# Patient Record
Sex: Male | Born: 1945 | Race: Black or African American | Hispanic: No | Marital: Married | Smoking: Former smoker
Health system: Southern US, Community
[De-identification: ages and names within clinical notes are randomized; demographics above are authoritative.]

## PROBLEM LIST (undated history)

## (undated) DIAGNOSIS — Z8719 Personal history of other diseases of the digestive system: Secondary | ICD-10-CM

## (undated) DIAGNOSIS — I4891 Unspecified atrial fibrillation: Secondary | ICD-10-CM

## (undated) DIAGNOSIS — I472 Ventricular tachycardia: Secondary | ICD-10-CM

## (undated) DIAGNOSIS — I219 Acute myocardial infarction, unspecified: Secondary | ICD-10-CM

## (undated) DIAGNOSIS — K922 Gastrointestinal hemorrhage, unspecified: Secondary | ICD-10-CM

## (undated) DIAGNOSIS — I509 Heart failure, unspecified: Secondary | ICD-10-CM

## (undated) DIAGNOSIS — E118 Type 2 diabetes mellitus with unspecified complications: Secondary | ICD-10-CM

## (undated) DIAGNOSIS — K259 Gastric ulcer, unspecified as acute or chronic, without hemorrhage or perforation: Secondary | ICD-10-CM

## (undated) DIAGNOSIS — I4892 Unspecified atrial flutter: Secondary | ICD-10-CM

## (undated) DIAGNOSIS — I10 Essential (primary) hypertension: Secondary | ICD-10-CM

## (undated) HISTORY — PX: HERNIA REPAIR: SHX51

---

## 2005-02-17 ENCOUNTER — Other Ambulatory Visit: Payer: Self-pay

## 2005-02-17 ENCOUNTER — Inpatient Hospital Stay: Payer: Self-pay

## 2006-10-25 ENCOUNTER — Inpatient Hospital Stay: Payer: Self-pay | Admitting: Internal Medicine

## 2006-10-25 ENCOUNTER — Other Ambulatory Visit: Payer: Self-pay

## 2007-04-04 ENCOUNTER — Ambulatory Visit: Payer: Self-pay | Admitting: Family

## 2007-07-17 ENCOUNTER — Ambulatory Visit: Payer: Self-pay | Admitting: Family

## 2007-08-09 ENCOUNTER — Ambulatory Visit: Payer: Self-pay | Admitting: Internal Medicine

## 2007-10-09 ENCOUNTER — Ambulatory Visit: Payer: Self-pay | Admitting: Family

## 2008-01-08 ENCOUNTER — Ambulatory Visit: Payer: Self-pay | Admitting: Family

## 2008-06-19 ENCOUNTER — Ambulatory Visit: Payer: Self-pay | Admitting: Family

## 2008-11-01 ENCOUNTER — Ambulatory Visit: Payer: Self-pay | Admitting: Family

## 2009-05-01 ENCOUNTER — Ambulatory Visit: Payer: Self-pay | Admitting: Family

## 2009-08-20 ENCOUNTER — Ambulatory Visit: Payer: Self-pay | Admitting: Family

## 2009-12-11 ENCOUNTER — Ambulatory Visit: Payer: Self-pay | Admitting: Family

## 2010-04-09 ENCOUNTER — Ambulatory Visit: Payer: Self-pay | Admitting: Family

## 2010-05-26 ENCOUNTER — Emergency Department: Payer: Self-pay | Admitting: Emergency Medicine

## 2010-08-13 ENCOUNTER — Ambulatory Visit: Payer: Self-pay | Admitting: Family

## 2011-07-03 LAB — CK TOTAL AND CKMB (NOT AT ARMC)
CK, Total: 405 U/L — ABNORMAL HIGH (ref 35–232)
CK-MB: 2.1 ng/mL (ref 0.5–3.6)

## 2011-07-03 LAB — CBC
HCT: 40.8 % (ref 40.0–52.0)
MCH: 22.5 pg — ABNORMAL LOW (ref 26.0–34.0)
MCHC: 31.3 g/dL — ABNORMAL LOW (ref 32.0–36.0)
MCV: 72 fL — ABNORMAL LOW (ref 80–100)
Platelet: 305 10*3/uL (ref 150–440)
RDW: 14.4 % (ref 11.5–14.5)

## 2011-07-03 LAB — COMPREHENSIVE METABOLIC PANEL
Albumin: 3.9 g/dL (ref 3.4–5.0)
Alkaline Phosphatase: 48 U/L — ABNORMAL LOW (ref 50–136)
Anion Gap: 10 (ref 7–16)
Bilirubin,Total: 0.3 mg/dL (ref 0.2–1.0)
Calcium, Total: 9.7 mg/dL (ref 8.5–10.1)
EGFR (Non-African Amer.): 53 — ABNORMAL LOW
Glucose: 223 mg/dL — ABNORMAL HIGH (ref 65–99)
Osmolality: 291 (ref 275–301)
Potassium: 3.8 mmol/L (ref 3.5–5.1)
SGOT(AST): 18 U/L (ref 15–37)
Sodium: 141 mmol/L (ref 136–145)

## 2011-07-04 ENCOUNTER — Inpatient Hospital Stay: Payer: Self-pay | Admitting: Student

## 2011-07-04 LAB — FERRITIN: Ferritin (ARMC): 22 ng/mL (ref 8–388)

## 2011-07-04 LAB — TSH: Thyroid Stimulating Horm: 1.6 u[IU]/mL

## 2011-07-04 LAB — IRON AND TIBC
Iron Bind.Cap.(Total): 326 ug/dL (ref 250–450)
Iron: 55 ug/dL — ABNORMAL LOW (ref 65–175)

## 2011-07-05 LAB — LIPID PANEL
Cholesterol: 153 mg/dL (ref 0–200)
Triglycerides: 113 mg/dL (ref 0–200)
VLDL Cholesterol, Calc: 23 mg/dL (ref 5–40)

## 2011-07-05 LAB — BASIC METABOLIC PANEL
Anion Gap: 11 (ref 7–16)
BUN: 19 mg/dL — ABNORMAL HIGH (ref 7–18)
Chloride: 103 mmol/L (ref 98–107)
Creatinine: 1.33 mg/dL — ABNORMAL HIGH (ref 0.60–1.30)
EGFR (African American): >60
EGFR (Non-African Amer.): 57 — ABNORMAL LOW
Glucose: 142 mg/dL — ABNORMAL HIGH (ref 65–99)

## 2011-09-24 ENCOUNTER — Inpatient Hospital Stay: Payer: Self-pay | Admitting: *Deleted

## 2011-09-24 LAB — COMPREHENSIVE METABOLIC PANEL
Albumin: 3.7 g/dL (ref 3.4–5.0)
Alkaline Phosphatase: 53 U/L (ref 50–136)
Anion Gap: 12 (ref 7–16)
BUN: 13 mg/dL (ref 7–18)
Calcium, Total: 9 mg/dL (ref 8.5–10.1)
Chloride: 107 mmol/L (ref 98–107)
Co2: 23 mmol/L (ref 21–32)
Creatinine: 1.28 mg/dL (ref 0.60–1.30)
EGFR (African American): >60
EGFR (Non-African Amer.): 60 — ABNORMAL LOW
Glucose: 152 mg/dL — ABNORMAL HIGH (ref 65–99)
Osmolality: 286 (ref 275–301)
SGOT(AST): 18 U/L (ref 15–37)
Sodium: 142 mmol/L (ref 136–145)

## 2011-09-24 LAB — CK TOTAL AND CKMB (NOT AT ARMC)
CK, Total: 208 U/L (ref 35–232)
CK-MB: 2.2 ng/mL (ref 0.5–3.6)

## 2011-09-24 LAB — CBC
HGB: 11.7 g/dL — ABNORMAL LOW (ref 13.0–18.0)
MCH: 20.7 pg — ABNORMAL LOW (ref 26.0–34.0)
MCHC: 30.6 g/dL — ABNORMAL LOW (ref 32.0–36.0)
MCV: 68 fL — ABNORMAL LOW (ref 80–100)
RBC: 5.64 10*6/uL (ref 4.40–5.90)
RDW: 16.2 % — ABNORMAL HIGH (ref 11.5–14.5)

## 2011-09-24 LAB — MAGNESIUM: Magnesium: 1.4 mg/dL — ABNORMAL LOW

## 2011-09-24 LAB — TROPONIN I
Troponin-I: 0.02 ng/mL
Troponin-I: <0.02 ng/mL

## 2011-09-24 LAB — PRO B NATRIURETIC PEPTIDE: B-Type Natriuretic Peptide: 3580 pg/mL — ABNORMAL HIGH (ref 0–125)

## 2011-09-24 LAB — PROTIME-INR: INR: 1.8

## 2011-09-25 LAB — BASIC METABOLIC PANEL WITH GFR
Anion Gap: 12
BUN: 15 mg/dL
Calcium, Total: 8.3 mg/dL — ABNORMAL LOW
Chloride: 107 mmol/L
Co2: 24 mmol/L
Creatinine: 1.28 mg/dL
EGFR (African American): >60
EGFR (Non-African Amer.): 60 — ABNORMAL LOW
Glucose: 122 mg/dL — ABNORMAL HIGH
Osmolality: 287
Potassium: 4 mmol/L
Sodium: 143 mmol/L

## 2011-12-05 ENCOUNTER — Emergency Department: Payer: Self-pay | Admitting: *Deleted

## 2011-12-05 LAB — PROTIME-INR
INR: 2.3
Prothrombin Time: 25.6 secs — ABNORMAL HIGH (ref 11.5–14.7)

## 2011-12-05 LAB — COMPREHENSIVE METABOLIC PANEL
Albumin: 3.5 g/dL (ref 3.4–5.0)
BUN: 8 mg/dL (ref 7–18)
Bilirubin,Total: 0.3 mg/dL (ref 0.2–1.0)
Calcium, Total: 8.7 mg/dL (ref 8.5–10.1)
Chloride: 108 mmol/L — ABNORMAL HIGH (ref 98–107)
Co2: 23 mmol/L (ref 21–32)
Creatinine: 1.06 mg/dL (ref 0.60–1.30)
EGFR (African American): >60
EGFR (Non-African Amer.): >60
Glucose: 102 mg/dL — ABNORMAL HIGH (ref 65–99)
Osmolality: 280 (ref 275–301)
SGOT(AST): 21 U/L (ref 15–37)
SGPT (ALT): 18 U/L
Sodium: 141 mmol/L (ref 136–145)
Total Protein: 7.9 g/dL (ref 6.4–8.2)

## 2011-12-05 LAB — CBC
HGB: 11.6 g/dL — ABNORMAL LOW (ref 13.0–18.0)
MCH: 19.5 pg — ABNORMAL LOW (ref 26.0–34.0)
RDW: 21 % — ABNORMAL HIGH (ref 11.5–14.5)
WBC: 4.5 10*3/uL (ref 3.8–10.6)

## 2011-12-05 LAB — URINALYSIS, COMPLETE
Bilirubin,UR: NEGATIVE
Glucose,UR: NEGATIVE mg/dL (ref 0–75)
Hyaline Cast: 1
Ketone: NEGATIVE
Nitrite: NEGATIVE
Ph: 7 (ref 4.5–8.0)
Protein: 100
RBC,UR: 3 /HPF (ref 0–5)
Specific Gravity: 1.008 (ref 1.003–1.030)

## 2011-12-05 LAB — CK TOTAL AND CKMB (NOT AT ARMC): CK, Total: 171 U/L (ref 35–232)

## 2012-06-15 ENCOUNTER — Emergency Department: Payer: Self-pay | Admitting: Internal Medicine

## 2012-06-15 LAB — COMPREHENSIVE METABOLIC PANEL
Bilirubin,Total: 0.3 mg/dL (ref 0.2–1.0)
Calcium, Total: 8.8 mg/dL (ref 8.5–10.1)
Chloride: 110 mmol/L — ABNORMAL HIGH (ref 98–107)
EGFR (African American): >60
EGFR (Non-African Amer.): >60
Glucose: 138 mg/dL — ABNORMAL HIGH (ref 65–99)
Osmolality: 284 (ref 275–301)
Potassium: 3.7 mmol/L (ref 3.5–5.1)
SGPT (ALT): 19 U/L (ref 12–78)
Sodium: 141 mmol/L (ref 136–145)
Total Protein: 8 g/dL (ref 6.4–8.2)

## 2012-06-15 LAB — CBC
HCT: 40.2 % (ref 40.0–52.0)
HGB: 12.5 g/dL — ABNORMAL LOW (ref 13.0–18.0)
MCH: 21.6 pg — ABNORMAL LOW (ref 26.0–34.0)
MCHC: 31.1 g/dL — ABNORMAL LOW (ref 32.0–36.0)
Platelet: 286 10*3/uL (ref 150–440)
RDW: 15.5 % — ABNORMAL HIGH (ref 11.5–14.5)
WBC: 4.5 10*3/uL (ref 3.8–10.6)

## 2012-11-27 ENCOUNTER — Emergency Department: Payer: Self-pay | Admitting: Emergency Medicine

## 2012-11-27 LAB — BASIC METABOLIC PANEL
Anion Gap: 5 — ABNORMAL LOW (ref 7–16)
BUN: 13 mg/dL (ref 7–18)
Co2: 27 mmol/L (ref 21–32)
EGFR (African American): >60
EGFR (Non-African Amer.): >60
Potassium: 3.9 mmol/L (ref 3.5–5.1)

## 2012-11-27 LAB — CBC: HGB: 12 g/dL — ABNORMAL LOW (ref 13.0–18.0)

## 2013-04-25 LAB — BASIC METABOLIC PANEL
Anion Gap: 5 — ABNORMAL LOW (ref 7–16)
Calcium, Total: 9 mg/dL (ref 8.5–10.1)
Chloride: 107 mmol/L (ref 98–107)
Co2: 26 mmol/L (ref 21–32)
Creatinine: 1.84 mg/dL — ABNORMAL HIGH (ref 0.60–1.30)
EGFR (African American): 43 — ABNORMAL LOW
EGFR (Non-African Amer.): 37 — ABNORMAL LOW
Potassium: 4.3 mmol/L (ref 3.5–5.1)
Sodium: 138 mmol/L (ref 136–145)

## 2013-04-25 LAB — CBC
HGB: 8.8 g/dL — ABNORMAL LOW (ref 13.0–18.0)
MCH: 21.4 pg — ABNORMAL LOW (ref 26.0–34.0)
MCHC: 31.2 g/dL — ABNORMAL LOW (ref 32.0–36.0)
MCV: 69 fL — ABNORMAL LOW (ref 80–100)
RBC: 4.12 10*6/uL — ABNORMAL LOW (ref 4.40–5.90)
RDW: 14.5 % (ref 11.5–14.5)
WBC: 7.3 10*3/uL (ref 3.8–10.6)

## 2013-04-25 LAB — TROPONIN I: Troponin-I: <0.02 ng/mL

## 2013-04-25 LAB — CK TOTAL AND CKMB (NOT AT ARMC): CK, Total: 160 U/L (ref 35–232)

## 2013-04-26 ENCOUNTER — Inpatient Hospital Stay: Payer: Self-pay | Admitting: Internal Medicine

## 2013-04-26 LAB — URINALYSIS, COMPLETE
Bacteria: NONE SEEN
Bilirubin,UR: NEGATIVE
Blood: NEGATIVE
Glucose,UR: 50 mg/dL (ref 0–75)
Ketone: NEGATIVE
Protein: 30
RBC,UR: 6 /HPF (ref 0–5)
Specific Gravity: 1.021 (ref 1.003–1.030)
WBC UR: 1 /HPF (ref 0–5)

## 2013-04-26 LAB — HEMOGLOBIN
HGB: 7.4 g/dL — ABNORMAL LOW (ref 13.0–18.0)
HGB: 7.4 g/dL — ABNORMAL LOW (ref 13.0–18.0)

## 2013-04-26 LAB — OCCULT BLOOD X 1 CARD TO LAB, STOOL: Occult Blood, Feces: POSITIVE

## 2013-04-26 LAB — CK TOTAL AND CKMB (NOT AT ARMC)
CK, Total: 125 U/L (ref 35–232)
CK-MB: 1.2 ng/mL (ref 0.5–3.6)

## 2013-04-26 LAB — TROPONIN I: Troponin-I: <0.02 ng/mL

## 2013-04-26 LAB — HEMATOCRIT: HCT: 23.1 % — ABNORMAL LOW (ref 40.0–52.0)

## 2013-04-27 LAB — CBC WITH DIFFERENTIAL/PLATELET
Basophil #: 0.1 10*3/uL (ref 0.0–0.1)
Basophil %: 1.1 %
Eosinophil #: 0.4 10*3/uL (ref 0.0–0.7)
Eosinophil %: 6.1 %
HGB: 8 g/dL — ABNORMAL LOW (ref 13.0–18.0)
Lymphocyte #: 1.7 10*3/uL (ref 1.0–3.6)
Lymphocyte %: 29.3 %
Monocyte #: 0.5 x10 3/mm (ref 0.2–1.0)
Neutrophil #: 3.2 10*3/uL (ref 1.4–6.5)
Neutrophil %: 55.4 %
Platelet: 202 10*3/uL (ref 150–440)
RDW: 16.5 % — ABNORMAL HIGH (ref 11.5–14.5)
WBC: 5.8 10*3/uL (ref 3.8–10.6)

## 2013-04-28 LAB — STOOL CULTURE

## 2013-05-27 ENCOUNTER — Inpatient Hospital Stay: Payer: Self-pay | Admitting: Internal Medicine

## 2013-05-27 LAB — PROTIME-INR
INR: 1
Prothrombin Time: 13.7 secs (ref 11.5–14.7)

## 2013-05-27 LAB — COMPREHENSIVE METABOLIC PANEL
Albumin: 3.9 g/dL (ref 3.4–5.0)
Alkaline Phosphatase: 56 U/L
BUN: 16 mg/dL (ref 7–18)
Bilirubin,Total: 0.2 mg/dL (ref 0.2–1.0)
Chloride: 104 mmol/L (ref 98–107)
EGFR (Non-African Amer.): 37 — ABNORMAL LOW
Glucose: 169 mg/dL — ABNORMAL HIGH (ref 65–99)
Osmolality: 277 (ref 275–301)
SGPT (ALT): 23 U/L (ref 12–78)
Sodium: 136 mmol/L (ref 136–145)

## 2013-05-27 LAB — CBC
HGB: 10.4 g/dL — ABNORMAL LOW (ref 13.0–18.0)
MCH: 20.7 pg — ABNORMAL LOW (ref 26.0–34.0)
MCHC: 30.9 g/dL — ABNORMAL LOW (ref 32.0–36.0)
Platelet: 403 10*3/uL (ref 150–440)
WBC: 7.2 10*3/uL (ref 3.8–10.6)

## 2013-05-27 LAB — LIPID PANEL
Cholesterol: 167 mg/dL (ref 0–200)
Triglycerides: 111 mg/dL (ref 0–200)
VLDL Cholesterol, Calc: 22 mg/dL (ref 5–40)

## 2013-05-27 LAB — APTT: Activated PTT: 26.6 secs (ref 23.6–35.9)

## 2013-05-27 LAB — TROPONIN I: Troponin-I: <0.02 ng/mL

## 2013-05-28 LAB — CBC WITH DIFFERENTIAL/PLATELET
Basophil #: 0.1 10*3/uL (ref 0.0–0.1)
Basophil %: 1.1 %
Eosinophil #: 0.2 10*3/uL (ref 0.0–0.7)
Eosinophil %: 3.8 %
HCT: 27.8 % — ABNORMAL LOW (ref 40.0–52.0)
HGB: 8.8 g/dL — ABNORMAL LOW (ref 13.0–18.0)
Lymphocyte #: 1.9 10*3/uL (ref 1.0–3.6)
Lymphocyte %: 29.9 %
MCH: 20.6 pg — ABNORMAL LOW (ref 26.0–34.0)
MCHC: 31.5 g/dL — ABNORMAL LOW (ref 32.0–36.0)
Monocyte %: 9.3 %
Neutrophil %: 55.9 %
RBC: 4.25 10*6/uL — ABNORMAL LOW (ref 4.40–5.90)
RDW: 20.5 % — ABNORMAL HIGH (ref 11.5–14.5)

## 2013-05-28 LAB — BASIC METABOLIC PANEL
Anion Gap: 5 — ABNORMAL LOW (ref 7–16)
BUN: 19 mg/dL — ABNORMAL HIGH (ref 7–18)
Calcium, Total: 8.6 mg/dL (ref 8.5–10.1)
Chloride: 108 mmol/L — ABNORMAL HIGH (ref 98–107)
Co2: 26 mmol/L (ref 21–32)
EGFR (Non-African Amer.): 48 — ABNORMAL LOW
Potassium: 3.8 mmol/L (ref 3.5–5.1)
Sodium: 139 mmol/L (ref 136–145)

## 2013-05-28 LAB — TROPONIN I: Troponin-I: <0.02 ng/mL

## 2013-05-28 LAB — MAGNESIUM: Magnesium: 1.6 mg/dL — ABNORMAL LOW

## 2013-05-28 LAB — TSH: Thyroid Stimulating Horm: 0.789 u[IU]/mL

## 2014-04-27 ENCOUNTER — Inpatient Hospital Stay: Payer: Self-pay | Admitting: Internal Medicine

## 2014-04-27 LAB — COMPREHENSIVE METABOLIC PANEL
ALK PHOS: 30 U/L — AB
Albumin: 3.2 g/dL — ABNORMAL LOW (ref 3.4–5.0)
Anion Gap: 7 (ref 7–16)
BUN: 51 mg/dL — AB (ref 7–18)
Bilirubin,Total: 0.2 mg/dL (ref 0.2–1.0)
CALCIUM: 8.4 mg/dL — AB (ref 8.5–10.1)
CO2: 26 mmol/L (ref 21–32)
Chloride: 108 mmol/L — ABNORMAL HIGH (ref 98–107)
Creatinine: 1.72 mg/dL — ABNORMAL HIGH (ref 0.60–1.30)
EGFR (African American): 51 — ABNORMAL LOW
EGFR (Non-African Amer.): 42 — ABNORMAL LOW
Glucose: 286 mg/dL — ABNORMAL HIGH (ref 65–99)
OSMOLALITY: 305 (ref 275–301)
Potassium: 4.3 mmol/L (ref 3.5–5.1)
SGOT(AST): 15 U/L (ref 15–37)
SGPT (ALT): 27 U/L
Sodium: 141 mmol/L (ref 136–145)
Total Protein: 6.5 g/dL (ref 6.4–8.2)

## 2014-04-27 LAB — CBC
HCT: 24.5 % — ABNORMAL LOW (ref 40.0–52.0)
HGB: 7.7 g/dL — AB (ref 13.0–18.0)
MCH: 23.3 pg — ABNORMAL LOW (ref 26.0–34.0)
MCHC: 31.3 g/dL — ABNORMAL LOW (ref 32.0–36.0)
MCV: 75 fL — ABNORMAL LOW (ref 80–100)
Platelet: 202 10*3/uL (ref 150–440)
RBC: 3.29 10*6/uL — AB (ref 4.40–5.90)
RDW: 14.2 % (ref 11.5–14.5)
WBC: 3.9 10*3/uL (ref 3.8–10.6)

## 2014-04-27 LAB — PROTIME-INR
INR: 5.5
Prothrombin Time: 48.2 secs — ABNORMAL HIGH (ref 11.5–14.7)

## 2014-04-27 LAB — TROPONIN I: Troponin-I: <0.02 ng/mL

## 2014-04-27 LAB — APTT: Activated PTT: 44.6 secs — ABNORMAL HIGH (ref 23.6–35.9)

## 2014-04-27 LAB — HEMOGLOBIN: HGB: 7.3 g/dL — AB (ref 13.0–18.0)

## 2014-04-28 LAB — BASIC METABOLIC PANEL
ANION GAP: 7 (ref 7–16)
BUN: 40 mg/dL — ABNORMAL HIGH (ref 7–18)
Calcium, Total: 8.1 mg/dL — ABNORMAL LOW (ref 8.5–10.1)
Chloride: 109 mmol/L — ABNORMAL HIGH (ref 98–107)
Co2: 27 mmol/L (ref 21–32)
Creatinine: 1.56 mg/dL — ABNORMAL HIGH (ref 0.60–1.30)
EGFR (African American): 57 — ABNORMAL LOW
EGFR (Non-African Amer.): 47 — ABNORMAL LOW
Glucose: 124 mg/dL — ABNORMAL HIGH (ref 65–99)
Osmolality: 296 (ref 275–301)
Potassium: 3.9 mmol/L (ref 3.5–5.1)
Sodium: 143 mmol/L (ref 136–145)

## 2014-04-28 LAB — CBC WITH DIFFERENTIAL/PLATELET
BASOS ABS: 0.1 10*3/uL (ref 0.0–0.1)
Basophil %: 1.5 %
EOS ABS: 0.2 10*3/uL (ref 0.0–0.7)
Eosinophil %: 4.3 %
HCT: 23 % — AB (ref 40.0–52.0)
HGB: 7.3 g/dL — ABNORMAL LOW (ref 13.0–18.0)
LYMPHS ABS: 1.3 10*3/uL (ref 1.0–3.6)
Lymphocyte %: 23.8 %
MCH: 24.2 pg — ABNORMAL LOW (ref 26.0–34.0)
MCHC: 31.8 g/dL — ABNORMAL LOW (ref 32.0–36.0)
MCV: 76 fL — ABNORMAL LOW (ref 80–100)
Monocyte #: 0.3 x10 3/mm (ref 0.2–1.0)
Monocyte %: 6.4 %
Neutrophil #: 3.4 10*3/uL (ref 1.4–6.5)
Neutrophil %: 64 %
Platelet: 163 10*3/uL (ref 150–440)
RBC: 3.03 10*6/uL — AB (ref 4.40–5.90)
RDW: 15.8 % — ABNORMAL HIGH (ref 11.5–14.5)
WBC: 5.3 10*3/uL (ref 3.8–10.6)

## 2014-04-28 LAB — HEMOGLOBIN A1C: Hemoglobin A1C: 7.6 % — ABNORMAL HIGH (ref 4.2–6.3)

## 2014-04-28 LAB — PROTIME-INR
INR: 1.2
PROTHROMBIN TIME: 14.6 s (ref 11.5–14.7)

## 2014-04-28 LAB — MAGNESIUM: Magnesium: 1.5 mg/dL — ABNORMAL LOW

## 2014-04-29 LAB — PROTIME-INR
INR: 1.1
Prothrombin Time: 14.5 secs (ref 11.5–14.7)

## 2014-04-29 LAB — CBC WITH DIFFERENTIAL/PLATELET
Basophil #: 0.1 10*3/uL (ref 0.0–0.1)
Basophil %: 1.1 %
EOS PCT: 4.9 %
Eosinophil #: 0.2 10*3/uL (ref 0.0–0.7)
HCT: 21.5 % — ABNORMAL LOW (ref 40.0–52.0)
HGB: 6.9 g/dL — AB (ref 13.0–18.0)
Lymphocyte #: 1.1 10*3/uL (ref 1.0–3.6)
Lymphocyte %: 21.9 %
MCH: 24.6 pg — AB (ref 26.0–34.0)
MCHC: 32.1 g/dL (ref 32.0–36.0)
MCV: 77 fL — ABNORMAL LOW (ref 80–100)
Monocyte #: 0.5 x10 3/mm (ref 0.2–1.0)
Monocyte %: 9.1 %
NEUTROS ABS: 3.2 10*3/uL (ref 1.4–6.5)
NEUTROS PCT: 63 %
Platelet: 169 10*3/uL (ref 150–440)
RBC: 2.81 10*6/uL — ABNORMAL LOW (ref 4.40–5.90)
RDW: 15.8 % — AB (ref 11.5–14.5)
WBC: 5.1 10*3/uL (ref 3.8–10.6)

## 2014-04-30 LAB — CBC WITH DIFFERENTIAL/PLATELET
BASOS PCT: 1.1 %
Basophil #: 0.1 10*3/uL (ref 0.0–0.1)
Eosinophil #: 0.2 10*3/uL (ref 0.0–0.7)
Eosinophil %: 4.2 %
HCT: 24.3 % — ABNORMAL LOW (ref 40.0–52.0)
HGB: 7.9 g/dL — AB (ref 13.0–18.0)
LYMPHS ABS: 1.1 10*3/uL (ref 1.0–3.6)
LYMPHS PCT: 20.5 %
MCH: 26.1 pg (ref 26.0–34.0)
MCHC: 32.5 g/dL (ref 32.0–36.0)
MCV: 80 fL (ref 80–100)
MONOS PCT: 8.5 %
Monocyte #: 0.5 x10 3/mm (ref 0.2–1.0)
NEUTROS PCT: 65.7 %
Neutrophil #: 3.6 10*3/uL (ref 1.4–6.5)
PLATELETS: 183 10*3/uL (ref 150–440)
RBC: 3.04 10*6/uL — AB (ref 4.40–5.90)
RDW: 17.9 % — AB (ref 11.5–14.5)
WBC: 5.4 10*3/uL (ref 3.8–10.6)

## 2014-05-20 ENCOUNTER — Inpatient Hospital Stay (HOSPITAL_COMMUNITY)
Admission: EM | Admit: 2014-05-20 | Discharge: 2014-05-27 | DRG: 281 | Disposition: A | Discharge status: Home / Self Care | Payer: Medicare HMO | Source: Ambulatory Visit | Attending: Cardiovascular Disease | Admitting: Cardiovascular Disease

## 2014-05-20 ENCOUNTER — Emergency Department: Payer: Self-pay | Admitting: Emergency Medicine

## 2014-05-20 ENCOUNTER — Encounter (HOSPITAL_COMMUNITY): Payer: Self-pay | Admitting: Cardiovascular Disease

## 2014-05-20 ENCOUNTER — Encounter (HOSPITAL_COMMUNITY)
Admission: EM | Disposition: A | Discharge status: Home / Self Care | Payer: Self-pay | Source: Ambulatory Visit | Attending: Cardiovascular Disease

## 2014-05-20 DIAGNOSIS — N183 Chronic kidney disease, stage 3 unspecified: Secondary | ICD-10-CM

## 2014-05-20 DIAGNOSIS — R001 Bradycardia, unspecified: Secondary | ICD-10-CM | POA: Diagnosis not present

## 2014-05-20 DIAGNOSIS — I480 Paroxysmal atrial fibrillation: Secondary | ICD-10-CM | POA: Diagnosis present

## 2014-05-20 DIAGNOSIS — E119 Type 2 diabetes mellitus without complications: Secondary | ICD-10-CM | POA: Diagnosis present

## 2014-05-20 DIAGNOSIS — I129 Hypertensive chronic kidney disease with stage 1 through stage 4 chronic kidney disease, or unspecified chronic kidney disease: Secondary | ICD-10-CM | POA: Diagnosis present

## 2014-05-20 DIAGNOSIS — I2510 Atherosclerotic heart disease of native coronary artery without angina pectoris: Secondary | ICD-10-CM | POA: Diagnosis present

## 2014-05-20 DIAGNOSIS — I472 Ventricular tachycardia, unspecified: Secondary | ICD-10-CM

## 2014-05-20 DIAGNOSIS — E785 Hyperlipidemia, unspecified: Secondary | ICD-10-CM | POA: Diagnosis present

## 2014-05-20 DIAGNOSIS — IMO0002 Reserved for concepts with insufficient information to code with codable children: Secondary | ICD-10-CM

## 2014-05-20 DIAGNOSIS — I214 Non-ST elevation (NSTEMI) myocardial infarction: Secondary | ICD-10-CM | POA: Diagnosis present

## 2014-05-20 DIAGNOSIS — D649 Anemia, unspecified: Secondary | ICD-10-CM

## 2014-05-20 DIAGNOSIS — D509 Iron deficiency anemia, unspecified: Secondary | ICD-10-CM | POA: Diagnosis present

## 2014-05-20 DIAGNOSIS — I509 Heart failure, unspecified: Secondary | ICD-10-CM | POA: Diagnosis present

## 2014-05-20 DIAGNOSIS — E1165 Type 2 diabetes mellitus with hyperglycemia: Secondary | ICD-10-CM

## 2014-05-20 DIAGNOSIS — M542 Cervicalgia: Secondary | ICD-10-CM | POA: Diagnosis present

## 2014-05-20 DIAGNOSIS — I10 Essential (primary) hypertension: Secondary | ICD-10-CM | POA: Diagnosis present

## 2014-05-20 DIAGNOSIS — E118 Type 2 diabetes mellitus with unspecified complications: Secondary | ICD-10-CM

## 2014-05-20 HISTORY — DX: Gastrointestinal hemorrhage, unspecified: K92.2

## 2014-05-20 HISTORY — DX: Ventricular tachycardia: I47.2

## 2014-05-20 HISTORY — DX: Type 2 diabetes mellitus with unspecified complications: E11.8

## 2014-05-20 HISTORY — PX: LEFT HEART CATHETERIZATION WITH CORONARY ANGIOGRAM: SHX5451

## 2014-05-20 HISTORY — DX: Unspecified atrial fibrillation: I48.91

## 2014-05-20 HISTORY — DX: Ventricular tachycardia, unspecified: I47.20

## 2014-05-20 HISTORY — DX: Gastric ulcer, unspecified as acute or chronic, without hemorrhage or perforation: K25.9

## 2014-05-20 HISTORY — DX: Unspecified atrial flutter: I48.92

## 2014-05-20 HISTORY — DX: Essential (primary) hypertension: I10

## 2014-05-20 LAB — CBC
HCT: 37.4 % — AB (ref 40.0–52.0)
HGB: 11.5 g/dL — ABNORMAL LOW (ref 13.0–18.0)
MCH: 24.1 pg — ABNORMAL LOW (ref 26.0–34.0)
MCHC: 30.7 g/dL — ABNORMAL LOW (ref 32.0–36.0)
MCV: 79 fL — AB (ref 80–100)
PLATELETS: 313 10*3/uL (ref 150–440)
RBC: 4.77 10*6/uL (ref 4.40–5.90)
RDW: 17 % — ABNORMAL HIGH (ref 11.5–14.5)
WBC: 5.9 10*3/uL (ref 3.8–10.6)

## 2014-05-20 LAB — COMPREHENSIVE METABOLIC PANEL
ALK PHOS: 52 U/L
AST: 21 U/L (ref 15–37)
Albumin: 3.6 g/dL (ref 3.4–5.0)
Anion Gap: 9 (ref 7–16)
BUN: 29 mg/dL — AB (ref 7–18)
Bilirubin,Total: 0.3 mg/dL (ref 0.2–1.0)
CHLORIDE: 107 mmol/L (ref 98–107)
CREATININE: 1.87 mg/dL — AB (ref 0.60–1.30)
Calcium, Total: 8.5 mg/dL (ref 8.5–10.1)
Co2: 25 mmol/L (ref 21–32)
EGFR (African American): 46 — ABNORMAL LOW
GFR CALC NON AF AMER: 38 — AB
Glucose: 245 mg/dL — ABNORMAL HIGH (ref 65–99)
OSMOLALITY: 295 (ref 275–301)
POTASSIUM: 3.9 mmol/L (ref 3.5–5.1)
SGPT (ALT): 39 U/L
Sodium: 141 mmol/L (ref 136–145)
Total Protein: 7.8 g/dL (ref 6.4–8.2)

## 2014-05-20 LAB — CK TOTAL AND CKMB (NOT AT ARMC)
CK, TOTAL: 232 U/L (ref 39–308)
CK-MB: 1.8 ng/mL (ref 0.5–3.6)

## 2014-05-20 LAB — PROTIME-INR
INR: 1.1
INR: 1.08 (ref 0.00–1.49)
PROTHROMBIN TIME: 14.3 s (ref 11.5–14.7)
Prothrombin Time: 14.1 seconds (ref 11.6–15.2)

## 2014-05-20 LAB — APTT: Activated PTT: 23.3 secs — ABNORMAL LOW (ref 23.6–35.9)

## 2014-05-20 LAB — GLUCOSE, CAPILLARY: Glucose-Capillary: 208 mg/dL — ABNORMAL HIGH (ref 70–99)

## 2014-05-20 LAB — PRO B NATRIURETIC PEPTIDE: Pro B Natriuretic peptide (BNP): 522.2 pg/mL — ABNORMAL HIGH (ref 0–125)

## 2014-05-20 LAB — MRSA PCR SCREENING: MRSA by PCR: NEGATIVE

## 2014-05-20 LAB — TROPONIN I

## 2014-05-20 SURGERY — LEFT HEART CATHETERIZATION WITH CORONARY ANGIOGRAM
Anesthesia: LOCAL

## 2014-05-20 MED ORDER — NITROGLYCERIN 1 MG/10 ML FOR IR/CATH LAB
INTRA_ARTERIAL | Status: AC
  Filled 2014-05-20: qty 10

## 2014-05-20 MED ORDER — INSULIN ASPART 100 UNIT/ML ~~LOC~~ SOLN
0.0000 [IU] | Freq: Three times a day (TID) | SUBCUTANEOUS | Status: DC
  Administered 2014-05-21 (×2): 3 [IU] via SUBCUTANEOUS
  Administered 2014-05-21: 8 [IU] via SUBCUTANEOUS
  Administered 2014-05-22 – 2014-05-23 (×4): 3 [IU] via SUBCUTANEOUS
  Administered 2014-05-24 (×3): 2 [IU] via SUBCUTANEOUS
  Administered 2014-05-25: 3 [IU] via SUBCUTANEOUS
  Administered 2014-05-25 (×2): 2 [IU] via SUBCUTANEOUS
  Administered 2014-05-26: 3 [IU] via SUBCUTANEOUS
  Administered 2014-05-26 – 2014-05-27 (×3): 2 [IU] via SUBCUTANEOUS

## 2014-05-20 MED ORDER — HEPARIN (PORCINE) IN NACL 2-0.9 UNIT/ML-% IJ SOLN
INTRAMUSCULAR | Status: AC
  Filled 2014-05-20: qty 1500

## 2014-05-20 MED ORDER — MIDAZOLAM HCL 2 MG/2ML IJ SOLN
INTRAMUSCULAR | Status: AC
  Filled 2014-05-20: qty 2

## 2014-05-20 MED ORDER — HEPARIN (PORCINE) IN NACL 100-0.45 UNIT/ML-% IJ SOLN
1750.0000 [IU]/h | INTRAMUSCULAR | Status: DC
  Administered 2014-05-20: 1100 [IU]/h via INTRAVENOUS
  Administered 2014-05-21: 1400 [IU]/h via INTRAVENOUS
  Administered 2014-05-23: 1500 [IU]/h via INTRAVENOUS
  Administered 2014-05-24: 1650 [IU]/h via INTRAVENOUS
  Administered 2014-05-24: 1500 [IU]/h via INTRAVENOUS
  Filled 2014-05-20 (×10): qty 250

## 2014-05-20 MED ORDER — OXYCODONE-ACETAMINOPHEN 5-325 MG PO TABS: 1.0000 | ORAL_TABLET | ORAL | Status: DC | PRN

## 2014-05-20 MED ORDER — ONDANSETRON HCL 4 MG/2ML IJ SOLN: 4.0000 mg | Freq: Four times a day (QID) | INTRAMUSCULAR | Status: DC | PRN

## 2014-05-20 MED ORDER — LIDOCAINE HCL (PF) 1 % IJ SOLN
INTRAMUSCULAR | Status: AC
  Filled 2014-05-20: qty 30

## 2014-05-20 MED ORDER — SODIUM CHLORIDE 0.9 % IJ SOLN: 3.0000 mL | INTRAMUSCULAR | Status: DC | PRN

## 2014-05-20 MED ORDER — WARFARIN SODIUM 5 MG PO TABS
5.0000 mg | ORAL_TABLET | Freq: Once | ORAL | Status: AC
  Administered 2014-05-20: 5 mg via ORAL
  Filled 2014-05-20: qty 1

## 2014-05-20 MED ORDER — METOPROLOL TARTRATE 25 MG PO TABS: 25.0000 mg | ORAL_TABLET | Freq: Two times a day (BID) | ORAL | Status: DC

## 2014-05-20 MED ORDER — WARFARIN - PHARMACIST DOSING INPATIENT
Freq: Every day | Status: DC
  Administered 2014-05-25: 18:00:00

## 2014-05-20 MED ORDER — HYDRALAZINE HCL 20 MG/ML IJ SOLN: 10.0000 mg | INTRAMUSCULAR | Status: DC | PRN

## 2014-05-20 MED ORDER — METOPROLOL TARTRATE 50 MG PO TABS
50.0000 mg | ORAL_TABLET | Freq: Two times a day (BID) | ORAL | Status: DC
  Administered 2014-05-20 – 2014-05-22 (×5): 50 mg via ORAL
  Filled 2014-05-20: qty 1
  Filled 2014-05-20: qty 2
  Filled 2014-05-20 (×6): qty 1

## 2014-05-20 MED ORDER — LABETALOL HCL 5 MG/ML IV SOLN
10.0000 mg | INTRAVENOUS | Status: DC | PRN
  Filled 2014-05-20: qty 4

## 2014-05-20 MED ORDER — SODIUM CHLORIDE 0.9 % IV SOLN: 250.0000 mL | INTRAVENOUS | Status: DC | PRN

## 2014-05-20 MED ORDER — SODIUM CHLORIDE 0.9 % IV SOLN
1.0000 mL/kg/h | INTRAVENOUS | Status: DC
  Administered 2014-05-20 (×2): 1 mL/kg/h via INTRAVENOUS

## 2014-05-20 MED ORDER — FENTANYL CITRATE 0.05 MG/ML IJ SOLN
INTRAMUSCULAR | Status: AC
  Filled 2014-05-20: qty 2

## 2014-05-20 MED ORDER — ACETAMINOPHEN 325 MG PO TABS
650.0000 mg | ORAL_TABLET | ORAL | Status: DC | PRN
  Administered 2014-05-26: 650 mg via ORAL
  Filled 2014-05-20: qty 2

## 2014-05-20 MED ORDER — INSULIN ASPART 100 UNIT/ML ~~LOC~~ SOLN
0.0000 [IU] | Freq: Every day | SUBCUTANEOUS | Status: DC
  Administered 2014-05-26: 2 [IU] via SUBCUTANEOUS

## 2014-05-20 MED ORDER — NITROGLYCERIN 0.4 MG SL SUBL: 0.4000 mg | SUBLINGUAL_TABLET | SUBLINGUAL | Status: DC | PRN

## 2014-05-20 MED ORDER — VERAPAMIL HCL 2.5 MG/ML IV SOLN
INTRAVENOUS | Status: AC
  Filled 2014-05-20: qty 2

## 2014-05-20 MED ORDER — HEPARIN SODIUM (PORCINE) 1000 UNIT/ML IJ SOLN
INTRAMUSCULAR | Status: AC
  Filled 2014-05-20: qty 1

## 2014-05-20 MED ORDER — SODIUM CHLORIDE 0.9 % IJ SOLN
3.0000 mL | Freq: Two times a day (BID) | INTRAMUSCULAR | Status: DC
  Administered 2014-05-20 – 2014-05-27 (×8): 3 mL via INTRAVENOUS

## 2014-05-20 MED ORDER — ATORVASTATIN CALCIUM 40 MG PO TABS
40.0000 mg | ORAL_TABLET | Freq: Every day | ORAL | Status: DC
  Administered 2014-05-21 – 2014-05-26 (×6): 40 mg via ORAL
  Filled 2014-05-20 (×7): qty 1

## 2014-05-20 NOTE — Progress Notes (Signed)
elink only evaluation note for new admit    ICD-9-CM ICD-10-CM   1. NSTEMI (non-ST elevated myocardial infarction) 410.70 I21.4 Portable chest x-ray 1 view     Portable chest x-ray 1 view    Patient Active Problem List   Diagnosis Date Noted  . Ventricular tachycardia 05/20/2014  . NSTEMI (non-ST elevated myocardial infarction) 05/20/2014   On camera  - not intubated  - looks well  LABS PULMONARY No results for input(s): PHART, PCO2ART, PO2ART, HCO3, TCO2, O2SAT in the last 168 hours.  Invalid input(s): PCO2, PO2  CBC No results for input(s): HGB, HCT, WBC, PLT in the last 168 hours.  COAGULATION No results for input(s): INR in the last 168 hours.  CARDIAC  No results for input(s): TROPONINI in the last 168 hours. No results for input(s): PROBNP in the last 168 hours.   CHEMISTRY No results for input(s): NA, K, CL, CO2, GLUCOSE, BUN, CREATININE, CALCIUM, MG, PHOS in the last 168 hours. CrCl cannot be calculated (Patient has no serum creatinine result on file.).   LIVER No results for input(s): AST, ALT, ALKPHOS, BILITOT, PROT, ALBUMIN, INR in the last 168 hours.   INFECTIOUS No results for input(s): LATICACIDVEN, PROCALCITON in the last 168 hours.   ENDOCRINE CBG (last 3)  No results for input(s): GLUCAP in the last 72 hours.       IMAGING x48h No results found.   A NO acute EICU intervenion  Dr. Kalman ShanMurali Braidyn Scorsone, M.D., [facility]C.P Pulmonary and Critical Care Medicine Staff Physician [facility] Pager: [phone], If no answer or between  15:00h - 7:00h: call [phone]  05/20/2014 7:54 PM

## 2014-05-20 NOTE — H&P (Signed)
History and Physical  Patient ID: [name] MRN: [id number], SOB: 09/14/1945 68 y.o. Date of Encounter: 05/20/2014, 6:25 PM  Primary Physician: No primary care provider on file. Primary Cardiologist: Dr Gwen PoundsKowalski  Chief Complaint: Neck pain  HPI: 68 y.o. male w/ PMHx significant for atrial fibrillation, CHF, and GI bleeding who [facility] with complaints of neck pain.  He was [facility]. He was noted to have anterolateral ST-T wave changes concerning for acute ischemia but not diagnostic of STEMI. However, he developed wide-complex tachycardia c/w VT requiring DCCV and IV amiodarone, converting back to sinus rhythm. A Code STEMI was paged out because of ongoing ischemic symptoms and VT arrest.   The patient has a history of paroxysmal atrial fibrillation. He has previously been anticoagulated, but has had problems with gastrointestinal bleeding. He did have recent upper endoscopy demonstrating only esophagitis and gastric ulcers with clean bases. He has not had black stools or bright red blood in his stools over the last 2 weeks. He's had no chest pain, but describes neck tightness yesterday and earlier today without relief. He felt like it was difficult to swallow. He has no shortness of breath, palpitations, or syncope. However, he did have marked diaphoresis this afternoon when symptoms began.   The patient presents directly to the cardiac catheterization lab for emergency cardiac cath and possible PCI.   Past Medical History  Diagnosis Date  . Ventricular tachycardia 05/20/2014     Surgical History: No past surgical history on file.   Home Meds: Prior to Admission medications   Not on File    Allergies: No Known Allergies  History   Social History  . Marital Status: Married    Spouse Name: N/A    Number of Children: N/A  . Years of Education: N/A   Occupational History  . Not on file.   Social History Main  Topics  . Smoking status: Not on file  . Smokeless tobacco: Not on file  . Alcohol Use: Not on file  . Drug Use: Not on file  . Sexual Activity: Not on file   Other Topics Concern  . Not on file   Social History Narrative    Family Hx: negative for premature CAD  Review of Systems: General: negative for chills, fever, night sweats or weight changes.  ENT: negative for rhinorrhea or epistaxis Cardiovascular: see HPI Dermatological: negative for rash Respiratory: negative for cough or wheezing GI: negative for nausea, vomiting. Positive for diarrhea, dark stools GU: no hematuria, urgency, or frequency Neurologic: negative for visual changes, syncope, headache, or dizziness Heme: no easy bruising  Endo: negative for excessive thirst, thyroid disorder, or flushing Musculoskeletal: negative for joint pain or swelling, negative for myalgias All other systems reviewed and are otherwise negative except as noted above.  Physical Exam: Blood pressure 153/105, pulse 88, temperature 98 F (36.7 C), temperature source Oral, resp. rate 21, height 5\' 11"  (1.803 m), weight 205 lb 14.6 oz (93.4 kg), SpO2 98 %. General: Well developed, well nourished, alert and oriented, mild distress on O2 per NRB HEENT: Normocephalic, atraumatic, sclera non-icteric, no xanthomas, nares are without discharge.  Neck: Supple. Carotids 2+ without bruits. JVP normal Lungs: Clear bilaterally to auscultation without wheezes, rales, or rhonchi. Breathing is unlabored. Heart: irregualr with normal S1 and S2. No murmurs, rubs, or gallops appreciated. Abdomen: Soft, non-tender, non-distended with normoactive bowel sounds. No hepatomegaly. No rebound/guarding. No obvious abdominal masses. Back: No  CVA tenderness Msk:  Strength and tone appear normal for age. Extremities: No clubbing, cyanosis, or edema.  Distal pedal pulses are 2+ and equal bilaterally. Neuro: CNII-XII intact, moves all extremities spontaneously. Psych:   Responds to questions appropriately with a normal affect.   Labs:  No results found for: WBC, HGB, HCT, MCV, PLT No results for input(s): NA, K, CL, CO2, BUN, CREATININE, CALCIUM, PROT, BILITOT, ALKPHOS, ALT, AST, GLUCOSE in the last 168 hours.  Invalid input(s): LABALBU No results for input(s): CKTOTAL, CKMB, TROPONINI in the last 72 hours. No results found for: CHOL, HDL, LDLCALC, TRIG No results found for: DDIMER  Radiology/Studies:  No results found.   EKG: NSR with ST-T wave abnormality consider lateral ischemia.  ASSESSMENT AND PLAN:  1. Acute NSTEMI 2. VT/VF arrest s/p defibrillation and treated with IV amiodarone 3. Atrial fibrillation, unable to take warfarin because of recurrent GI bleeding 4. CKD, stage 3 5. Type 2 diabetes, on oral hypoglycemics  Plan: emergent cardiac cath +/- PCI, IV heparin with close eye on potential bleeding, continue IV amiodarone, SSI for diabetes. Further plans pending cath results.   Disposition: guarded  Signed, Tonny BollmanMichael Danette Weinfeld MD 05/20/2014, 6:25 PM

## 2014-05-20 NOTE — CV Procedure (Signed)
Cardiac Catheterization Procedure Note  Name: [name] MRN: [id number] DOB: 11/22/1945  Procedure: Left Heart Cath, Selective Coronary Angiography  Indication:  Non-ST elevation MI. This 68 year old gentleman with history of atrial fibrillation and recurrent GI bleeding presented today with neck pain and ST segment changes on EKG. He developed [facility]. He required defibrillation and was also treated with IV amiodarone. The patient was transferred emergently for cardiac catheterization in the setting of ongoing ischemic symptoms.   Procedural Details: The right wrist was prepped, draped, and anesthetized with 1% lidocaine. Using the modified Seldinger technique, a 5/6 French Slender sheath was introduced into the right radial artery. 3 mg of verapamil was administered through the sheath, weight-based unfractionated heparin was administered intravenously. Standard Judkins catheters were used for selective coronary angiography.   The LAD and left circumflex had separate ostia and ultimately a 5 [place]French EBU catheter was used for left coronary artery angiography. The right coronary artery was difficult to find. I used in a AL2 catheter for nonselective imaging.  Ventriculography was deferred because of elevated creatinine. Catheter exchanges were performed over an exchange length guidewire. There were no immediate procedural complications. A TR band was used for radial hemostasis at the completion of the procedure.  The patient was transferred to the post catheterization recovery area for further monitoring.  Procedural Findings: Hemodynamics: AO  141/73 LV  145/16  Coronary angiography: Coronary dominance:  left  Left mainstem:  Very short segment. The LAD and left circumflex nearly have separate ostia. There is no obstruction of the left main.  Left anterior descending (LAD):  The LAD is diffusely diseased. The proximal  vessel is widely patent. After the first septal perforator, there is 30% mid vessel stenosis. The LAD has  Diffuse 50-70% stenosis at the apex.  The vessel wraps around the left ventricular apex. There is moderate proximal LAD calcification present.  Left circumflex (LCx):  The left circumflex is patent. The vessel is dominant.  The proximal and mid circumflex are diffusely diseased. The mid circumflex has 50% stenosis before it divides into multiple OM branches. There are approximately 5 fairly equal sized subbranches of the first obtuse marginal. One of these subbranches is totally occluded in its distal aspect. This has the appearance of an abrupt cut off suspicious for coronary embolism.  Right coronary artery (RCA):  This is a relatively small, nondominant vessel. There are 2 RV marginal branches supplied. There is no high-grade stenosis present.  Left ventriculography:  Deferred because of chronic kidney disease  Estimated Blood Loss:  minimal  Final Conclusions:   1. Acute occlusion of a left circumflex/obtuse marginal subbranch Y clearly related to coronary embolism  2. Mild to moderate diffuse coronary artery disease as detailed above.  Recommendations:  The patient will be transferred to the CCU. He will be continued on IV amiodarone until tomorrow morning when it can probably be discontinued. I think the most likely mechanism of his acute MI his coronary embolism related to atrial fibrillation. The patient has not been anticoagulated because of recurrent GI bleeding. Abrupt distal vessel occlusion is suggestive of an embolic event rather than a de novo plaque rupture. The patient had recent EGD with gastric ulcers noted with a 'clean base.' Favor re-initiation of warfarin with an unfractionated heparin bridge and close monitoring of H/H while he is hospitalized. May be reasonable to consider LAA closure once he recovers from his MI. Would avoid ASA as  we initiate warfarin in this patient  with recurrent GI bleeding. Will discuss options with his primary cardiologist, Dr Gwen PoundsKowalski.  Tonny BollmanMichael Saya Mccoll MD, [facility] 05/20/2014, 6:07 PM

## 2014-05-20 NOTE — Progress Notes (Addendum)
ANTICOAGULATION CONSULT NOTE - Initial Consult  Pharmacy Consult for heparin, coumadin Indication: chest pain/ACS, afib  No Known Allergies  Patient Measurements: Height: 5\' 11"  (180.3 cm) Weight: 205 lb 14.6 oz (93.4 kg) IBW/kg (Calculated) : 75.3 Heparin Dosing Weight: 93.4kg  Vital Signs: Temp: 98 F (36.7 C) (11/23 1752) Temp Source: Oral (11/23 1752) BP: 153/105 mmHg (11/23 1800) Pulse Rate: 88 (11/23 1800)  Labs: No results for input(s): HGB, HCT, PLT, APTT, LABPROT, INR, HEPARINUNFRC, CREATININE, CKTOTAL, CKMB, TROPONINI in the last 72 hours.  CrCl cannot be calculated (Patient has no serum creatinine result on file.).   Medical History: Past Medical History  Diagnosis Date  . Ventricular tachycardia 05/20/2014  . Atrial fibrillation and flutter   . GI bleeding   . Gastric ulcer   . Diabetes mellitus with complication   . Essential hypertension     Medications:  No prescriptions prior to admission   Scheduled:  . [START ON 05/21/2014] atorvastatin  40 mg Oral q1800  . [START ON 05/21/2014] insulin aspart  0-15 Units Subcutaneous TID WC  . insulin aspart  0-5 Units Subcutaneous QHS  . metoprolol tartrate  50 mg Oral BID  . metoprolol tartrate  25 mg Oral BID  . [START ON 05/21/2014] sodium chloride  3 mL Intravenous Q12H    Assessment: 68 yo male from [place] with NSTEMI and also noted s/p VT/VF arrest. He is noted with history of GIB but per MD- likely coronary embolism due to afib and to begin coumadin. He is now s/p cath and pharmacy has been consulted to dose heparin and coumadin.  Heparin to begin 2 hours after TR band removed (done at 8:30pm). INR = 1.08  Labs from [place]: Hg/hct= 05/02/36.4, plt= 313  Goal of Therapy:  INR 2-3 Heparin level 0.3-0.7 units/ml Monitor platelets by anticoagulation protocol: Yes   Plan:  -No heparin bolus -Begin heparin at 1100 units/hr (~12 units/kg/hr) -Heparin level in 6 hours and daily wth CBC  daily -Coumadin 5mg  (lower dose than per protocol due to history of GIB) -Daily PT/INR  Harland GermanAndrew Timm Bonenberger, Pharm D 05/20/2014 7:54 PM

## 2014-05-21 ENCOUNTER — Encounter (HOSPITAL_COMMUNITY): Payer: Self-pay | Admitting: *Deleted

## 2014-05-21 ENCOUNTER — Inpatient Hospital Stay (HOSPITAL_COMMUNITY): Payer: Medicare HMO

## 2014-05-21 DIAGNOSIS — D649 Anemia, unspecified: Secondary | ICD-10-CM

## 2014-05-21 DIAGNOSIS — I517 Cardiomegaly: Secondary | ICD-10-CM

## 2014-05-21 DIAGNOSIS — IMO0002 Reserved for concepts with insufficient information to code with codable children: Secondary | ICD-10-CM

## 2014-05-21 DIAGNOSIS — N183 Chronic kidney disease, stage 3 unspecified: Secondary | ICD-10-CM

## 2014-05-21 DIAGNOSIS — E118 Type 2 diabetes mellitus with unspecified complications: Secondary | ICD-10-CM

## 2014-05-21 DIAGNOSIS — I10 Essential (primary) hypertension: Secondary | ICD-10-CM | POA: Diagnosis present

## 2014-05-21 DIAGNOSIS — E785 Hyperlipidemia, unspecified: Secondary | ICD-10-CM

## 2014-05-21 DIAGNOSIS — E1165 Type 2 diabetes mellitus with hyperglycemia: Secondary | ICD-10-CM

## 2014-05-21 LAB — LIPID PANEL
CHOL/HDL RATIO: 3.1 ratio
CHOLESTEROL: 128 mg/dL (ref 0–200)
HDL: 41 mg/dL (ref >39)
LDL Cholesterol: 63 mg/dL (ref 0–99)
Triglycerides: 121 mg/dL (ref <150)
VLDL: 24 mg/dL (ref 0–40)

## 2014-05-21 LAB — GLUCOSE, CAPILLARY
GLUCOSE-CAPILLARY: 176 mg/dL — AB (ref 70–99)
GLUCOSE-CAPILLARY: 255 mg/dL — AB (ref 70–99)
Glucose-Capillary: 191 mg/dL — ABNORMAL HIGH (ref 70–99)
Glucose-Capillary: 122 mg/dL — ABNORMAL HIGH (ref 70–99)

## 2014-05-21 LAB — BASIC METABOLIC PANEL
Anion gap: 14 (ref 5–15)
BUN: 22 mg/dL (ref 6–23)
CALCIUM: 8.8 mg/dL (ref 8.4–10.5)
CO2: 21 mEq/L (ref 19–32)
CREATININE: 1.44 mg/dL — AB (ref 0.50–1.35)
Chloride: 106 mEq/L (ref 96–112)
GFR, EST AFRICAN AMERICAN: 56 mL/min — AB (ref >90)
GFR, EST NON AFRICAN AMERICAN: 48 mL/min — AB (ref >90)
GLUCOSE: 189 mg/dL — AB (ref 70–99)
POTASSIUM: 4.5 meq/L (ref 3.7–5.3)
Sodium: 141 mEq/L (ref 137–147)

## 2014-05-21 LAB — CBC
HCT: 34 % — ABNORMAL LOW (ref 39.0–52.0)
Hemoglobin: 10.6 g/dL — ABNORMAL LOW (ref 13.0–17.0)
MCH: 24 pg — AB (ref 26.0–34.0)
MCHC: 31.2 g/dL (ref 30.0–36.0)
MCV: 76.9 fL — AB (ref 78.0–100.0)
Platelets: 229 10*3/uL (ref 150–400)
RBC: 4.42 MIL/uL (ref 4.22–5.81)
RDW: 15.7 % — AB (ref 11.5–15.5)
WBC: 6.2 10*3/uL (ref 4.0–10.5)

## 2014-05-21 LAB — HEPARIN LEVEL (UNFRACTIONATED)
Heparin Unfractionated: 0.14 IU/mL — ABNORMAL LOW (ref 0.30–0.70)
Heparin Unfractionated: 0.38 IU/mL (ref 0.30–0.70)
Heparin Unfractionated: 0.43 IU/mL (ref 0.30–0.70)

## 2014-05-21 LAB — TROPONIN I

## 2014-05-21 LAB — TSH: TSH: 0.398 u[IU]/mL (ref 0.350–4.500)

## 2014-05-21 MED ORDER — PERFLUTREN LIPID MICROSPHERE
1.0000 mL | INTRAVENOUS | Status: AC | PRN
  Administered 2014-05-21: 2 mL via INTRAVENOUS
  Filled 2014-05-21: qty 10

## 2014-05-21 MED ORDER — PERFLUTREN LIPID MICROSPHERE
INTRAVENOUS | Status: AC
  Filled 2014-05-21: qty 10

## 2014-05-21 MED ORDER — WARFARIN SODIUM 5 MG PO TABS
5.0000 mg | ORAL_TABLET | Freq: Once | ORAL | Status: AC
  Administered 2014-05-21: 5 mg via ORAL
  Filled 2014-05-21: qty 1

## 2014-05-21 NOTE — Plan of Care (Signed)
Problem: Phase I Progression Outcomes Goal: Initial discharge plan identified Outcome: Progressing     

## 2014-05-21 NOTE — Progress Notes (Signed)
  Echocardiogram 2D Echocardiogram with Definity has been performed.  Tabius Rood 05/21/2014, 11:07 AM

## 2014-05-21 NOTE — Progress Notes (Signed)
SUBJECTIVE:  Pt seen and examined in AM. Pt is s/p left heart catherization that revealed acute occlusion of left circumflex/obtuse marginal subbranch Y due to cardiac embolus and mild to moderate CAD. He is currently on IV heparin, coumadin, and IV amiodarone s/p vtach arrest requiring DCCV.  He is currently chest pain free and denies palpitations or dyspnea. He denies bleed in his stool.   OBJECTIVE:   Vitals:   Filed Vitals:   05/21/14 0400 05/21/14 0500 05/21/14 0600 05/21/14 0700  BP: 137/58 124/71 143/63 143/65  Pulse: 54 55 55 56  Temp: 98 F (36.7 C)     TempSrc: Tympanic     Resp: 14 19 20 13   Height:      Weight:      SpO2: 98% 96% 98% 100%   I&O's:   Intake/Output Summary (Last 24 hours) at 05/21/14 0748 Last data filed at 05/21/14 0645  Gross per 24 hour  Intake 846.82 ml  Output   1625 ml  Net -778.18 ml   TELEMETRY: Reviewed telemetry pt in SB:    PHYSICAL EXAM General: Well developed, well nourished, in no acute distress Head:   Normal cephalic and atramatic  Lungs:  Clear bilaterally to auscultation. Heart:  HRRR S1 S2  No JVD.   Abdomen: Abdomen soft and non-tender Msk:  Back normal,  Normal strength and tone for age. Extremities:   No edema.   Neuro: Alert and oriented. Psych:  Normal affect, responds appropriately Skin: No rash   LABS: Basic Metabolic Panel:  Recent Labs  56/38/7511/24/15 0530  NA 141  K 4.5  CL 106  CO2 21  GLUCOSE 189*  BUN 22  CREATININE 1.44*  CALCIUM 8.8   Liver Function Tests: No results for input(s): AST, ALT, ALKPHOS, BILITOT, PROT, ALBUMIN in the last 72 hours. No results for input(s): LIPASE, AMYLASE in the last 72 hours. CBC:  Recent Labs  05/21/14 0530  WBC 6.2  HGB 10.6*  HCT 34.0*  MCV 76.9*  PLT PENDING   Cardiac Enzymes:  Recent Labs  05/20/14 2042 05/21/14 0006 05/21/14 0530  TROPONINI >20.00* >20.00* >20.00*   BNP: Invalid input(s): POCBNP D-Dimer: No results for input(s): DDIMER in  the last 72 hours. Hemoglobin A1C: No results for input(s): HGBA1C in the last 72 hours. Fasting Lipid Panel:  Recent Labs  05/21/14 0530  CHOL 128  HDL 41  LDLCALC 63  TRIG 121  CHOLHDL 3.1   Thyroid Function Tests: No results for input(s): TSH, T4TOTAL, T3FREE, THYROIDAB in the last 72 hours.  Invalid input(s): FREET3 Anemia Panel: No results for input(s): VITAMINB12, FOLATE, FERRITIN, TIBC, IRON, RETICCTPCT in the last 72 hours. Coag Panel:   Lab Results  Component Value Date   INR 1.08 05/20/2014    RADIOLOGY: Portable Chest X-ray 1 View  05/21/2014   CLINICAL DATA:  Non ST elevated myocardial infarction  EXAM: PORTABLE CHEST - 1 VIEW  COMPARISON:  May 20, 2014  FINDINGS: There is no edema or consolidation. Heart is enlarged with pulmonary vascularity within normal limits. No pneumothorax. No adenopathy. No bone lesions.  IMPRESSION: Cardiomegaly, stable.  No edema or consolidation.   Electronically Signed   By: Bretta BangWilliam  Woodruff M.D.   On: 05/21/2014 07:36   Principal Problem:   NSTEMI (non-ST elevated myocardial infarction) Active Problems:   Ventricular tachycardia   Essential hypertension   CKD (chronic kidney disease) stage 3, GFR 30-59 ml/min   Hyperlipemia   Diabetes mellitus   Anemia  ASSESSMENT: 68 yo man with pmh of HTN, HL, CAD, CKD Stage 3, [facility], and T2DM who presented with neck pain found to have vtach arrest s/p DCCM and IV amiodarone requiring emergent catherization that revealed acute occlusion of left circumflex/obtuse marginal subbranch Y due to cardiac embolus and mild to moderate CAD.   PLAN:    Acute NSTEMI in setting of occlusive cardiac embolus and CAD- Currently CP-free. Troponins elevated >20, will repeat in AM.  Pt is s/p cardiac catherization yesterday that revealed acute occlusion of left circumflex/obtuse marginal subbranch Y due to cardiac embolus and mild to moderate CAD. Due  to occlusive cardiac embolus pt currently on IV heparin and coumadin, caution in setting of history of recurrent upper GI bleed. Pt currently on lopressor 50 mg BID and lipitor 40 mg daily. Hold aspirin 81 mg daily due to increased risk of bleeding. 2D-echo today. Pt at home on valsartan 320 mg daily and lopressor 25 mg  BID. Vtach Arrest s/p DCCV - Pt currently in SB on IV amiodarone which will discontinue today. Continue to monitor on telemetry.  Paroxysmal Atrial Fibrillation - Currently in SB with normal HR. [facility] but was taken off due to recurrent upper GI bleeding. Last EGD reported to have esophagitis and clean based gastric ulcers. Due to occlusive cardiac embolus, pt currently on IV heparin and coumadin. Pt on lopressor 50 mg BID for rate control. He was at home on lopressor 25 mg BID and diltiazem 240 mg daily (if EF reduced will need to discontinue). Will obtain TSH.   History of CHF - 2D-echo pending. Pro-BNP mildly elevated at 522 with no pulmonary edema on CXR. Currently on lopressor 50 mg BID. Pt at home on lopressor 25 mg BID, hydralazine 10 mg QID, and valsartan-HCTZ 320-25 mg daily. If EF is reduced will need to discontinue home diltiazem.  Hypertension - Currently hypertensive on lopressor 50 mg BID. Pt at home on diltiazem 240 mg daily, lopressor 25 mg BID, valsartan-HCTZ 320-25 mg daily, and hydralazine 10 mg QID. Hyperlipidemia - Lipid panel with LD 63 at goal <70. Continue lipitor 40 mg daily. Pt at home on simvastatin 10 mg daily. Microcytic Anemia - Hg 10.6 with unknown baseline. Pt with history of upper GI bleeding with last EGD reported to have esophagitis and clean based gastric ulcers. Per family normal colonoscopy within last 10 years. Monitor for bleeding with IV heparin/coumadin. Obtain anemia panel and FOBT. Continue to monitor CBC. CKD Stage 3 - Cr 1.44 with unknown baseline. Continue to monitor renal function. Hold home valsartan 320 mg  daily if AKI on CKD.  Non-Insulin Dependent Type II DM - A1c pending. Pt at home on glipizide 10 mg in AM and 5 mg in PM. Currently on moderate SSI with meals and at bedtime.   Pt likely transfer to telemetry later today after 2D-echo.    Otis BraceMarjan Andri Prestia, MD  PGY-II IMTS Pager [phone] 05/21/2014  7:48 AM  History and all data above reviewed.  Patient examined.  I agree with the findings as above. No chest pain .  No SOB.  The patient exam reveals COR:RRR  ,  Lungs: Clear  ,  Abd: Positive bowel sounds, no rebound no guarding, Ext Right wrist without bleeding.  No edema  .  All available labs, radiology testing, previous records reviewed. Agree with documented assessment and plan. Cardiac arrest:  Await echo.  Stop  IV amiodarone.  Cath films reviewed.   BP elevated:  Follow up echo and consider ACE inhibitor.  CAD:  Risk reduction.  Statin started.      Rollene Rotunda  8:31 AM  05/21/2014

## 2014-05-21 NOTE — Plan of Care (Signed)
Problem: Phase I Progression Outcomes Goal: Voiding-avoid urinary catheter unless indicated Outcome: Completed/Met Date Met:  05/21/14

## 2014-05-21 NOTE — Plan of Care (Signed)
Problem: Phase I Progression Outcomes Goal: Vascular site scale level 0 - I Vascular Site Scale Level 0: No bruising/bleeding/hematoma Level I (Mild): Bruising/Ecchymosis, minimal bleeding/ooozing, palpable hematoma < 3 cm Level II (Moderate): Bleeding not affecting hemodynamic parameters, pseudoaneurysm, palpable hematoma > 3 cm Level III (Severe) Bleeding which affects hemodynamic parameters or retroperitoneal hemorrhage  Outcome: Completed/Met Date Met:  05/21/14

## 2014-05-21 NOTE — Plan of Care (Signed)
Problem: Phase I Progression Outcomes Goal: Pain controlled with appropriate interventions Outcome: Completed/Met Date Met:  05/21/14     

## 2014-05-21 NOTE — Progress Notes (Signed)
Report given to MontesanoPriscella, RN on 2W

## 2014-05-21 NOTE — Progress Notes (Signed)
ANTICOAGULATION CONSULT NOTE - Follow Up Consult  Pharmacy Consult for Heparin and Coumadin Indication: Afib, likely coronary embolism  No Known Allergies  Patient Measurements: Height: 5\' 11"  (180.3 cm) Weight: 205 lb 14.6 oz (93.4 kg) IBW/kg (Calculated) : 75.3 Heparin Dosing Weight: 93.4kg  Vital Signs: Temp: 98.2 F (36.8 C) (11/24 1207) Temp Source: Oral (11/24 1207) BP: 140/67 mmHg (11/24 1207) Pulse Rate: 54 (11/24 1207)  Labs:  Recent Labs  05/20/14 2042 05/21/14 0006 05/21/14 0530 05/21/14 1329  HGB  --   --  10.6*  --   HCT  --   --  34.0*  --   PLT  --   --  229  --   LABPROT 14.1  --   --   --   INR 1.08  --   --   --   HEPARINUNFRC  --   --  0.14* 0.38  CREATININE  --   --  1.44*  --   TROPONINI >20.00* >20.00* >20.00*  --     Estimated Creatinine Clearance: 56.5 mL/min (by C-G formula based on Cr of 1.44).   Medications:  Heparin drip 1400 units/hr  Assessment: 68yom on heparin bridging to Coumadin for Afib and suspected coronary embolism. Patient had previously been on Coumadin (6mg  daily) but was taken off ~2-3 weeks ago due to GI bleed. OK per Cardiology to restart Coumadin. Heparin level (0.38) is now therapeutic after rate increase - will continue current rate and check follow-up level to confirm therapeutic. Baseline INR was 1.08 last PM - will repeat Coumadin 5mg  tonight. - Hg 10.6, Plts 229 - No bleeding per patient  Goal of Therapy:  INR 2-3 Heparin level 0.3-0.7 units/ml Monitor platelets by anticoagulation protocol: Yes   Plan:  1. Continue heparin drip 1400 units/hr (14 ml/hr) 2. Repeat Coumadin 5mg  PO x 1 today 3. Check follow-up heparin level @ 2000 4. Daily heparin level, INR, and CBC 5. Monitor for s/sx of bleeding  [name], [place]  [phone] 05/21/2014,3:05 PM

## 2014-05-21 NOTE — Progress Notes (Signed)
Pt arrived to the floor at 1205; pt A&O x4; denies any pain, pt oriented to the floor and unit, IV intact with heparin infusing; call light within reach; family at bedside and will continue to monitor quietly. P.Amo Sayre Witherington Rn.

## 2014-05-21 NOTE — Progress Notes (Signed)
ANTICOAGULATION CONSULT NOTE - Follow Up Consult  Pharmacy Consult for heparin Indication: atrial fibrillation and acute MI  Labs:  Recent Labs  05/20/14 2042 05/21/14 0006 05/21/14 0530  HGB  --   --  10.6*  HCT  --   --  34.0*  PLT  --   --  PENDING  LABPROT 14.1  --   --   INR 1.08  --   --   HEPARINUNFRC  --   --  0.14*  TROPONINI >20.00* >20.00*  --     Assessment: 68yo male subtherapeutic on heparin with initial dosing for Afib and acute MI.  Goal of Therapy:  Heparin level 0.3-0.7 units/ml   Plan:  Will increase heparin gtt by 3 units/kg/hr to 1400 units/hr and check level in 6hr.  Vernard GamblesVeronda Kynsleigh Westendorf, PharmD, BCPS  05/21/2014,6:39 AM

## 2014-05-21 NOTE — Progress Notes (Signed)
ANTICOAGULATION CONSULT NOTE - Follow Up Consult  Pharmacy Consult for Heparin Indication: Afib, likely coronary embolism  No Known Allergies  Patient Measurements: Height: 5\' 11"  (180.3 cm) Weight: 205 lb 14.6 oz (93.4 kg) IBW/kg (Calculated) : 75.3 Heparin Dosing Weight: 93.4kg  Vital Signs: Temp: 98.2 F (36.8 C) (11/24 1207) Temp Source: Oral (11/24 1207) BP: 140/67 mmHg (11/24 1207) Pulse Rate: 54 (11/24 1207)  Labs:  Recent Labs  05/20/14 2042 05/21/14 0006 05/21/14 0530 05/21/14 1329 05/21/14 1957  HGB  --   --  10.6*  --   --   HCT  --   --  34.0*  --   --   PLT  --   --  229  --   --   LABPROT 14.1  --   --   --   --   INR 1.08  --   --   --   --   HEPARINUNFRC  --   --  0.14* 0.38 0.43  CREATININE  --   --  1.44*  --   --   TROPONINI >20.00* >20.00* >20.00*  --   --     Estimated Creatinine Clearance: 56.5 mL/min (by C-G formula based on Cr of 1.44).   Medications:  Heparin drip 1400 units/hr  Assessment: 68yom on heparin bridging to Coumadin for Afib and suspected coronary embolism. Patient had previously been on Coumadin (6mg  daily) but was taken off ~2-3 weeks ago due to GI bleed. OK per Cardiology to restart Coumadin. Heparin level (0.38) is now therapeutic after rate increase - will continue current rate and check follow-up level to confirm therapeutic. Baseline INR was 1.08 last PM - will repeat Coumadin 5mg  tonight. - Hg 10.6, Plts 229 - No bleeding per patient  Confirmatory heparin level remains therapeutic at 0.43 on heparin 1400 units/hr. No bleeding is noted.  Goal of Therapy:  INR 2-3 Heparin level 0.3-0.7 units/ml Monitor platelets by anticoagulation protocol: Yes   Plan:  - Continue heparin drip 1400 units/hr (14 ml/hr) - Daily heparin level, INR, and CBC - Monitor for s/sx of bleeding   Arlean Hoppingorey M. Newman PiesBall, PharmD Clinical Pharmacist Pager [phone]  05/21/2014,8:25 PM

## 2014-05-21 NOTE — Care Management Note (Signed)
    Page 1 of 1   05/21/2014     8:44:07 AM CARE MANAGEMENT NOTE 05/21/2014  Patient:  [name],[name]   Account Number:  [id number]  Date Initiated:  05/21/2014  Documentation initiated by:  Junius CreamerWELL,DEBBIE  Subjective/Objective Assessment:   adm w v Alinda Moneytach     Action/Plan:   lives w fam   Anticipated DC Date:  05/22/2014   Anticipated DC Plan:  HOME/SELF CARE         Choice offered to / List presented to:             Status of service:   Medicare Important Message given?   (If response is "NO", the following Medicare IM given date fields will be blank) Date Medicare IM given:   Medicare IM given by:   Date Additional Medicare IM given:   Additional Medicare IM given by:    Discharge Disposition:    Per UR Regulation:  Reviewed for med. necessity/level of care/duration of stay  If discussed at Long Length of Stay Meetings, dates discussed:    Comments:

## 2014-05-21 NOTE — Plan of Care (Signed)
Problem: Phase I Progression Outcomes Goal: Distal pulses equal to baseline Outcome: Completed/Met Date Met:  05/21/14

## 2014-05-21 NOTE — Plan of Care (Signed)
Problem: Phase I Progression Outcomes Goal: Hemodynamically stable Outcome: Progressing     

## 2014-05-22 DIAGNOSIS — I10 Essential (primary) hypertension: Secondary | ICD-10-CM

## 2014-05-22 LAB — PROTIME-INR
INR: 1.16 (ref 0.00–1.49)
Prothrombin Time: 14.9 seconds (ref 11.6–15.2)

## 2014-05-22 LAB — CBC
HEMATOCRIT: 33.4 % — AB (ref 39.0–52.0)
Hemoglobin: 10.5 g/dL — ABNORMAL LOW (ref 13.0–17.0)
MCH: 24.2 pg — ABNORMAL LOW (ref 26.0–34.0)
MCHC: 31.4 g/dL (ref 30.0–36.0)
MCV: 77 fL — AB (ref 78.0–100.0)
Platelets: 220 10*3/uL (ref 150–400)
RBC: 4.34 MIL/uL (ref 4.22–5.81)
RDW: 15.4 % (ref 11.5–15.5)
WBC: 5.3 10*3/uL (ref 4.0–10.5)

## 2014-05-22 LAB — COMPREHENSIVE METABOLIC PANEL
ALK PHOS: 46 U/L (ref 39–117)
ALT: 53 U/L (ref 0–53)
AST: 89 U/L — AB (ref 0–37)
Albumin: 3.5 g/dL (ref 3.5–5.2)
Anion gap: 12 (ref 5–15)
BUN: 25 mg/dL — ABNORMAL HIGH (ref 6–23)
CO2: 24 meq/L (ref 19–32)
Calcium: 8.9 mg/dL (ref 8.4–10.5)
Chloride: 102 mEq/L (ref 96–112)
Creatinine, Ser: 1.56 mg/dL — ABNORMAL HIGH (ref 0.50–1.35)
GFR calc non Af Amer: 44 mL/min — ABNORMAL LOW (ref >90)
GFR, EST AFRICAN AMERICAN: 51 mL/min — AB (ref >90)
GLUCOSE: 159 mg/dL — AB (ref 70–99)
POTASSIUM: 4.4 meq/L (ref 3.7–5.3)
SODIUM: 138 meq/L (ref 137–147)
TOTAL PROTEIN: 7.1 g/dL (ref 6.0–8.3)
Total Bilirubin: 0.3 mg/dL (ref 0.3–1.2)

## 2014-05-22 LAB — FOLATE: Folate: 18.6 ng/mL

## 2014-05-22 LAB — IRON AND TIBC
IRON: 34 ug/dL — AB (ref 42–135)
Saturation Ratios: 13 % — ABNORMAL LOW (ref 20–55)
TIBC: 256 ug/dL (ref 215–435)
UIBC: 222 ug/dL (ref 125–400)

## 2014-05-22 LAB — GLUCOSE, CAPILLARY
GLUCOSE-CAPILLARY: 156 mg/dL — AB (ref 70–99)
GLUCOSE-CAPILLARY: 163 mg/dL — AB (ref 70–99)
GLUCOSE-CAPILLARY: 189 mg/dL — AB (ref 70–99)
Glucose-Capillary: 172 mg/dL — ABNORMAL HIGH (ref 70–99)

## 2014-05-22 LAB — RETICULOCYTES
RBC.: 4.34 MIL/uL (ref 4.22–5.81)
RETIC COUNT ABSOLUTE: 73.8 10*3/uL (ref 19.0–186.0)
Retic Ct Pct: 1.7 % (ref 0.4–3.1)

## 2014-05-22 LAB — OCCULT BLOOD X 1 CARD TO LAB, STOOL: Fecal Occult Bld: NEGATIVE

## 2014-05-22 LAB — HEPARIN LEVEL (UNFRACTIONATED): Heparin Unfractionated: 0.43 IU/mL (ref 0.30–0.70)

## 2014-05-22 LAB — VITAMIN B12: VITAMIN B 12: 406 pg/mL (ref 211–911)

## 2014-05-22 LAB — FERRITIN: Ferritin: 46 ng/mL (ref 22–322)

## 2014-05-22 MED ORDER — WARFARIN SODIUM 5 MG PO TABS
5.0000 mg | ORAL_TABLET | Freq: Once | ORAL | Status: AC
  Administered 2014-05-22: 5 mg via ORAL
  Filled 2014-05-22 (×2): qty 1

## 2014-05-22 NOTE — Progress Notes (Signed)
ANTICOAGULATION CONSULT NOTE - Follow Up Consult  Pharmacy Consult for Heparin and Coumadin Indication: Afib, likely coronary embolism  No Known Allergies  Patient Measurements: Height: 5\' 11"  (180.3 cm) Weight: 205 lb 14.6 oz (93.4 kg) IBW/kg (Calculated) : 75.3 Heparin Dosing Weight: 93.4kg  Vital Signs: Temp: 98 F (36.7 C) (11/25 0521) Temp Source: Oral (11/25 0521) BP: 110/62 mmHg (11/25 1005) Pulse Rate: 60 (11/25 1005)  Labs:  Recent Labs  05/20/14 2042 05/21/14 0006  05/21/14 0530 05/21/14 1329 05/21/14 1957 05/22/14 0449  HGB  --   --   --  10.6*  --   --  10.5*  HCT  --   --   --  34.0*  --   --  33.4*  PLT  --   --   --  229  --   --  220  LABPROT 14.1  --   --   --   --   --  14.9  INR 1.08  --   --   --   --   --  1.16  HEPARINUNFRC  --   --   < > 0.14* 0.38 0.43 0.43  CREATININE  --   --   --  1.44*  --   --  1.56*  TROPONINI >20.00* >20.00*  --  >20.00*  --   --   --   < > = values in this interval not displayed.  Estimated Creatinine Clearance: 52.2 mL/min (by C-G formula based on Cr of 1.56).   Medications:  Heparin 1400 units/hr  Assessment: 68yom on heparin bridging to Coumadin for Afib and suspected coronary embolism. Patient had previously been on Coumadin (6mg  daily) but was taken off ~2-3 weeks ago due to GI bleed. OK per Cardiology to restart Coumadin. Heparin level (0.43) remains therapeutic - will continue current rate and follow-up AM labs. INR (1.16) is subtherapeutic - will continue with 5mg  daily for now with recent h/o GIB.  - H/H and Plts stable - No significant bleeding reported per RN - AST slightly elevated (89) - monitor  Goal of Therapy:  INR 2-3 Heparin level 0.3-0.7 units/ml Monitor platelets by anticoagulation protocol: Yes   Plan:  1. Continue heparin drip 1400 units/hr (14 ml/hr) 2. Repeat Coumadin 5mg  PO x 1 today 3. Daily heparin level, INR, and CBC 4. Monitor for s/sx of bleeding  [name], [place] Robert   [phone] 05/22/2014,10:07 AM

## 2014-05-22 NOTE — Progress Notes (Signed)
SUBJECTIVE:   68 y.o. male w/ PMHx significant for atrial fibrillation, CHF, and GI bleeding who [facility] with complaints of neck pain. He was initially treated at [facility]. He was noted to have anterolateral ST-T wave changes concerning for acute ischemia but not diagnostic of STEMI. However, he developed wide-complex tachycardia c/w VT requiring DCCV and IV amiodarone, converting back to sinus rhythm. A Code STEMI was paged out because of ongoing ischemic symptoms and VT arrest.   At cath, he was found to have an emboli that obstructed a branch of his Lcx. He is being restarted on heparin and coumadin.  Feels well     OBJECTIVE:   Vitals:   Filed Vitals:   05/21/14 0900 05/21/14 1207 05/21/14 2041 05/22/14 0521  BP:  140/67 150/65 125/56  Pulse: 58 54 60 53  Temp:  98.2 F (36.8 C) 97.7 F (36.5 C) 98 F (36.7 C)  TempSrc:  Oral Oral Oral  Resp: 14 18 17 18   Height:      Weight:      SpO2: 100% 98% 100% 100%   I&O's:    Intake/Output Summary (Last 24 hours) at 05/22/14 [id number] Last data filed at 05/22/14 0914  Gross per 24 hour  Intake    120 ml  Output    500 ml  Net   -380 ml   TELEMETRY: Reviewed telemetry pt in SB:    PHYSICAL EXAM General: Well developed, well nourished, in no acute distress Head:   Normal cephalic and atramatic  Lungs:  Clear bilaterally to auscultation. Heart:  HRRR S1 S2  No JVD.   Abdomen: Abdomen soft and non-tender Msk:  Back normal,  Normal strength and tone for age. Extremities:   No edema.   Neuro: Alert and oriented. Psych:  Normal affect, responds appropriately Skin: No rash   LABS: Basic Metabolic Panel:  Recent Labs  96/09/5409/24/15 0530 05/22/14 0449  NA 141 138  K 4.5 4.4  CL 106 102  CO2 21 24  GLUCOSE 189* 159*  BUN 22 25*  CREATININE 1.44* 1.56*  CALCIUM 8.8 8.9   Liver Function Tests:  Recent Labs  05/22/14 0449  AST 89*  ALT 53  ALKPHOS 46  BILITOT 0.3  PROT 7.1    ALBUMIN 3.5   No results for input(s): LIPASE, AMYLASE in the last 72 hours. CBC:  Recent Labs  05/21/14 0530 05/22/14 0449  WBC 6.2 5.3  HGB 10.6* 10.5*  HCT 34.0* 33.4*  MCV 76.9* 77.0*  PLT 229 220   Cardiac Enzymes:  Recent Labs  05/20/14 2042 05/21/14 0006 05/21/14 0530  TROPONINI >20.00* >20.00* >20.00*   BNP: Invalid input(s): POCBNP D-Dimer: No results for input(s): DDIMER in the last 72 hours. Hemoglobin A1C: No results for input(s): HGBA1C in the last 72 hours. Fasting Lipid Panel:  Recent Labs  05/21/14 0530  CHOL 128  HDL 41  LDLCALC 63  TRIG 121  CHOLHDL 3.1   Thyroid Function Tests:  Recent Labs  05/21/14 0910  TSH 0.398   Anemia Panel:  Recent Labs  05/22/14 0449  RETICCTPCT 1.7   Coag Panel:   Lab Results  Component Value Date   INR 1.16 05/22/2014   INR 1.08 05/20/2014    RADIOLOGY: Portable Chest X-ray 1 View  05/21/2014   CLINICAL DATA:  Non ST elevated myocardial infarction  EXAM: PORTABLE CHEST - 1 VIEW  COMPARISON:  May 20, 2014  FINDINGS: There is no  edema or consolidation. Heart is enlarged with pulmonary vascularity within normal limits. No pneumothorax. No adenopathy. No bone lesions.  IMPRESSION: Cardiomegaly, stable.  No edema or consolidation.   Electronically Signed   By: Bretta BangWilliam  Woodruff M.D.   On: 05/21/2014 07:36   Principal Problem:   NSTEMI (non-ST elevated myocardial infarction) Active Problems:   Ventricular tachycardia   Essential hypertension   CKD (chronic kidney disease) stage 3, GFR 30-59 ml/min   Hyperlipemia   Diabetes mellitus   Anemia     ASSESSMENT: 68 yo man with pmh of HTN, HL, CAD, CKD Stage 3, [facility] due to recurrent upper GI bleeding, and T2DM who presented with neck pain found to have vtach arrest s/p DCCM and IV amiodarone requiring emergent catherization that revealed acute occlusion of left circumflex/obtuse marginal subbranch Y due to cardiac embolus and mild  to moderate CAD.   PLAN:    Acute NSTEMI in setting of occlusive cardiac embolus and CAD- Currently CP-free. Troponins elevated >20,   Pt currently on lopressor 50 mg BID and lipitor 40 mg daily. Hold aspirin 81 mg daily due to increased risk of bleeding. 2D-echo today. Pt at home on valsartan 320 mg daily and lopressor 25 mg  BID.  Vtach Arrest s/p DCCV - Pt currently in SB .   Paroxysmal Atrial Fibrillation - Currently in SB with normal HR. [facility] ([facility]) but was taken off due to recurrent upper GI bleeding. Last EGD reported to have esophagitis and clean based gastric ulcers. Due to occlusive cardiac embolus, pt currently on IV heparin and coumadin. Pt on lopressor 50 mg BID for rate control. He was at home on lopressor 25 mg BID and diltiazem 240 mg daily (if EF reduced will need to discontinue). TSH = .398   Coumadin per pharmacy .   History of CHF -  Normal LV  - 55-60%.  + diastolic dysfunction.  Pro-BNP mildly elevated at 522 with no pulmonary edema on CXR. Currently on lopressor 50 mg BID. Pt at home on lopressor 25 mg BID, hydralazine 10 mg QID, and valsartan-HCTZ 320-25 mg daily.    Hypertension - Currently hypertensive on lopressor 50 mg BID. Pt at home on diltiazem 240 mg daily, lopressor 25 mg BID, valsartan-HCTZ 320-25 mg daily, and hydralazine 10 mg QID.  Hyperlipidemia - Lipid panel with LD 63 at goal <70. Continue lipitor 40 mg daily. Pt at home on simvastatin 10 mg daily.  Microcytic Anemia - Hg 10.6 with unknown baseline. Pt with history of upper GI bleeding with last EGD reported to have esophagitis and clean based gastric ulcers. Per family normal colonoscopy within last 10 years. Monitor for bleeding with IV heparin/coumadin. Obtain anemia panel and FOBT. Continue to monitor CBC.  CKD Stage 3 - Cr 1.44 with unknown baseline. Continue to monitor renal function. Hold home valsartan 320 mg daily if AKI on CKD.   Non-Insulin Dependent Type II DM -  A1c pending. Pt at home on glipizide 10 mg in AM and 5 mg in PM. Currently on moderate SSI with meals and at bedtime.    Vesta MixerPhilip J. Jaicee Michelotti, Montez HagemanJr., MD, [facility] Hospital[facility]ACC 05/22/2014, 9:30 AM [address],  [address] Office [phone] Pager [phone]

## 2014-05-22 NOTE — Discharge Instructions (Signed)

## 2014-05-23 DIAGNOSIS — E1159 Type 2 diabetes mellitus with other circulatory complications: Secondary | ICD-10-CM

## 2014-05-23 LAB — CBC
HCT: 33 % — ABNORMAL LOW (ref 39.0–52.0)
Hemoglobin: 10.5 g/dL — ABNORMAL LOW (ref 13.0–17.0)
MCH: 24.4 pg — ABNORMAL LOW (ref 26.0–34.0)
MCHC: 31.8 g/dL (ref 30.0–36.0)
MCV: 76.6 fL — ABNORMAL LOW (ref 78.0–100.0)
PLATELETS: 219 10*3/uL (ref 150–400)
RBC: 4.31 MIL/uL (ref 4.22–5.81)
RDW: 15.3 % (ref 11.5–15.5)
WBC: 4.9 10*3/uL (ref 4.0–10.5)

## 2014-05-23 LAB — GLUCOSE, CAPILLARY
GLUCOSE-CAPILLARY: 168 mg/dL — AB (ref 70–99)
Glucose-Capillary: 164 mg/dL — ABNORMAL HIGH (ref 70–99)
Glucose-Capillary: 162 mg/dL — ABNORMAL HIGH (ref 70–99)

## 2014-05-23 LAB — HEPARIN LEVEL (UNFRACTIONATED)
HEPARIN UNFRACTIONATED: 0.25 [IU]/mL — AB (ref 0.30–0.70)
Heparin Unfractionated: 0.42 IU/mL (ref 0.30–0.70)

## 2014-05-23 LAB — PROTIME-INR
INR: 1.21 (ref 0.00–1.49)
Prothrombin Time: 15.4 seconds — ABNORMAL HIGH (ref 11.6–15.2)

## 2014-05-23 LAB — OCCULT BLOOD X 1 CARD TO LAB, STOOL: Fecal Occult Bld: NEGATIVE

## 2014-05-23 MED ORDER — WARFARIN SODIUM 7.5 MG PO TABS
7.5000 mg | ORAL_TABLET | Freq: Once | ORAL | Status: AC
  Administered 2014-05-23: 7.5 mg via ORAL
  Filled 2014-05-23: qty 1

## 2014-05-23 MED ORDER — METOPROLOL TARTRATE 25 MG PO TABS
25.0000 mg | ORAL_TABLET | Freq: Two times a day (BID) | ORAL | Status: DC
  Administered 2014-05-24 – 2014-05-25 (×2): 25 mg via ORAL
  Filled 2014-05-23 (×9): qty 1

## 2014-05-23 NOTE — Progress Notes (Signed)
ANTICOAGULATION CONSULT NOTE - Follow Up Consult  Pharmacy Consult for heparin Indication: atrial fibrillation and suspected coronary embolism  Labs:  Recent Labs  05/20/14 2042 05/21/14 0006  05/21/14 0530  05/21/14 1957 05/22/14 0449 05/23/14 0325  HGB  --   --   < > 10.6*  --   --  10.5* 10.5*  HCT  --   --   --  34.0*  --   --  33.4* 33.0*  PLT  --   --   --  229  --   --  220 PENDING  LABPROT 14.1  --   --   --   --   --  14.9 15.4*  INR 1.08  --   --   --   --   --  1.16 1.21  HEPARINUNFRC  --   --   --  0.14*  < > 0.43 0.43 0.25*  CREATININE  --   --   --  1.44*  --   --  1.56*  --   TROPONINI >20.00* >20.00*  --  >20.00*  --   --   --   --   < > = values in this interval not displayed.   Assessment: 68yo male now slightly subtherapeutic on heparin after three levels at goal; per RN no gtt issues overnight.  Goal of Therapy:  Heparin level 0.3-0.7 units/ml   Plan:  Will increase heparin gtt slightly to 1500 units/hr and check level in 6hr.  Jay GamblesVeronda Joyclyn [name], PharmD, BCPS  05/23/2014,5:02 AM

## 2014-05-23 NOTE — Progress Notes (Signed)
    Recieved a call from nurse that [name]'s HR has been in 30-40s all day. AM dose of lopressor was held. I cut his lopressor down from 50mg  to 25mg  BID and put hold parameters if HR<60.   Cline CrockKathryn Monay Houlton PA-C  [facility]

## 2014-05-23 NOTE — Plan of Care (Signed)
Problem: Phase I Progression Outcomes Goal: Hemodynamically stable Outcome: Completed/Met Date Met:  05/23/14     

## 2014-05-23 NOTE — Progress Notes (Signed)
SUBJECTIVE:   68 y.o. male w/ PMHx significant for atrial fibrillation, CHF, and GI bleeding who [facility] with complaints of neck pain. [facility] He was noted to have anterolateral ST-T wave changes concerning for acute ischemia but not diagnostic of STEMI. However, he developed wide-complex tachycardia c/w VT requiring DCCV and IV amiodarone, converting back to sinus rhythm. A Code STEMI was paged out because of ongoing ischemic symptoms and VT arrest.   At cath, he was found to have an emboli that obstructed a branch of his Lcx. He is being restarted on heparin and coumadin.  Feels well     OBJECTIVE:   Vitals:   Filed Vitals:   05/22/14 1005 05/22/14 1350 05/22/14 2025 05/23/14 0517  BP: 110/62 120/52 138/66 135/65  Pulse: 60 55 51 54  Temp:  98.3 F (36.8 C) 98.4 F (36.9 C) 98.2 F (36.8 C)  TempSrc:  Oral Oral Oral  Resp:  18 18 18   Height:      Weight:    208 lb 8.9 oz (94.6 kg)  SpO2:  100% 100% 100%   I&O's:    Intake/Output Summary (Last 24 hours) at 05/23/14 0755 Last data filed at 05/22/14 2200  Gross per 24 hour  Intake    363 ml  Output    750 ml  Net   -387 ml   TELEMETRY: Reviewed telemetry pt in SB:    PHYSICAL EXAM BP 135/65 mmHg  Pulse 54  Temp(Src) 98.2 F (36.8 C) (Oral)  Resp 18  Ht 5\' 11"  (1.803 m)  Wt 208 lb 8.9 oz (94.6 kg)  BMI 29.10 kg/m2  SpO2 100% General: Well developed, well nourished, in no acute distress Head:   Normal cephalic and atramatic  Lungs:  Clear bilaterally to auscultation. Heart:  HRRR S1 S2  No JVD.   Abdomen: Abdomen soft and non-tender Msk:  Back normal,  Normal strength and tone for age. Extremities:   No edema.   Neuro: Alert and oriented. Psych:  Normal affect, responds appropriately Skin: No rash   LABS: Basic Metabolic Panel:  Recent Labs  16/03/9610/24/15 0530 05/22/14 0449  NA 141 138  K 4.5 4.4  CL 106 102  CO2 21 24  GLUCOSE  189* 159*  BUN 22 25*  CREATININE 1.44* 1.56*  CALCIUM 8.8 8.9   Liver Function Tests:  Recent Labs  05/22/14 0449  AST 89*  ALT 53  ALKPHOS 46  BILITOT 0.3  PROT 7.1  ALBUMIN 3.5   No results for input(s): LIPASE, AMYLASE in the last 72 hours. CBC:  Recent Labs  05/22/14 0449 05/23/14 0325  WBC 5.3 4.9  HGB 10.5* 10.5*  HCT 33.4* 33.0*  MCV 77.0* 76.6*  PLT 220 219   Cardiac Enzymes:  Recent Labs  05/20/14 2042 05/21/14 0006 05/21/14 0530  TROPONINI >20.00* >20.00* >20.00*   BNP: Invalid input(s): POCBNP D-Dimer: No results for input(s): DDIMER in the last 72 hours. Hemoglobin A1C: No results for input(s): HGBA1C in the last 72 hours. Fasting Lipid Panel:  Recent Labs  05/21/14 0530  CHOL 128  HDL 41  LDLCALC 63  TRIG 121  CHOLHDL 3.1   Thyroid Function Tests:  Recent Labs  05/21/14 0910  TSH 0.398   Anemia Panel:  Recent Labs  05/22/14 0449  VITAMINB12 406  FOLATE 18.6  FERRITIN 46  TIBC 256  IRON 34*  RETICCTPCT 1.7   Coag Panel:  Lab Results  Component Value Date   INR 1.21 05/23/2014   INR 1.16 05/22/2014   INR 1.08 05/20/2014    RADIOLOGY: Portable Chest X-ray 1 View  05/21/2014   CLINICAL DATA:  Non ST elevated myocardial infarction  EXAM: PORTABLE CHEST - 1 VIEW  COMPARISON:  May 20, 2014  FINDINGS: There is no edema or consolidation. Heart is enlarged with pulmonary vascularity within normal limits. No pneumothorax. No adenopathy. No bone lesions.  IMPRESSION: Cardiomegaly, stable.  No edema or consolidation.   Electronically Signed   By: Bretta BangWilliam  Woodruff M.D.   On: 05/21/2014 07:36   Principal Problem:   NSTEMI (non-ST elevated myocardial infarction) Active Problems:   Ventricular tachycardia   Essential hypertension   CKD (chronic kidney disease) stage 3, GFR 30-59 ml/min   Hyperlipemia   Diabetes mellitus   Anemia     ASSESSMENT: 68 yo man with pmh of HTN, HL, CAD, CKD Stage 3, afib not on  anticoagulant due to recurrent upper GI bleeding, and  DM  who presented with neck pain found to have vtach arrest s/p DCCM and IV amiodarone requiring emergent catherization that revealed acute occlusion of left circumflex/obtuse marginal subbranch Y due to cardiac embolus and mild to moderate CAD.   PLAN:    Acute NSTEMI in setting of occlusive cardiac embolus and CAD- Currently CP-free. Troponins elevated >20,   Pt currently on lopressor 50 mg BID and lipitor 40 mg daily. Hold aspirin 81 mg daily due to increased risk of bleeding. . Pt at home on valsartan 320 mg daily and lopressor 25 mg  BID.  Vtach Arrest s/p DCCV - Pt currently in SB .   Paroxysmal Atrial Fibrillation - Currently in SB with normal HR. [facility] but was taken off due to recurrent upper GI bleeding. Last EGD reported to have esophagitis and clean based gastric ulcers. Due to occlusive cardiac embolus, pt currently on IV heparin and coumadin. Pt on lopressor 50 mg BID for rate control. He was at home on lopressor 25 mg BID and diltiazem 240 mg daily.   TSH = .398   Coumadin per pharmacy . INR is 1.21 today    History of CHF -  Normal LV  - 55-60%.  + diastolic dysfunction.  Pro-BNP mildly elevated at 522 with no pulmonary edema on CXR. Currently on lopressor 50 mg BID. Pt at home on lopressor 25 mg BID, hydralazine 10 mg QID, and valsartan-HCTZ 320-25 mg daily.    Hypertension -BP is better .   Hyperlipidemia - Lipid panel with LD 63 at goal <70. Continue lipitor 40 mg daily. Pt at home on simvastatin 10 mg daily.  Microcytic Anemia - Hg 10.6 with unknown baseline. Pt with history of upper GI bleeding with last EGD reported to have esophagitis and clean based gastric ulcers. Per family normal colonoscopy within last 10 years. Monitor for bleeding with IV heparin/coumadin.   Continue to monitor CBC.  CKD Stage 3 - Cr 1.44 with unknown baseline. Continue to monitor renal function. Hold home  valsartan 320 mg daily if AKI on CKD.   Non-Insulin Dependent Type II DM - A1c pending. Pt at home on glipizide 10 mg in AM and 5 mg in PM. Currently on moderate SSI with meals and at bedtime.    Vesta MixerPhilip J. Nahser, Montez HagemanJr., MD, [facility]ierFACC 05/23/2014, 7:55 AM [address],  [address] Office [phone] Pager [phone]

## 2014-05-23 NOTE — Progress Notes (Signed)
ANTICOAGULATION CONSULT NOTE - Follow Up Consult  Pharmacy Consult for Heparin and Coumadin Indication: Afib, likely coronary embolism  No Known Allergies  Patient Measurements: Height: 5\' 11"  (180.3 cm) Weight: 208 lb 8.9 oz (94.6 kg) IBW/kg (Calculated) : 75.3 Heparin Dosing Weight: 93.4kg  Vital Signs: Temp: 98.2 F (36.8 C) (11/26 0517) Temp Source: Oral (11/26 0517) BP: 135/65 mmHg (11/26 0517) Pulse Rate: 54 (11/26 0517)  Labs:  Recent Labs  05/20/14 2042 05/21/14 0006  05/21/14 0530  05/22/14 0449 05/23/14 0325 05/23/14 1031  HGB  --   --   < > 10.6*  --  10.5* 10.5*  --   HCT  --   --   --  34.0*  --  33.4* 33.0*  --   PLT  --   --   --  229  --  220 219  --   LABPROT 14.1  --   --   --   --  14.9 15.4*  --   INR 1.08  --   --   --   --  1.16 1.21  --   HEPARINUNFRC  --   --   --  0.14*  < > 0.43 0.25* 0.42  CREATININE  --   --   --  1.44*  --  1.56*  --   --   TROPONINI >20.00* >20.00*  --  >20.00*  --   --   --   --   < > = values in this interval not displayed.  Estimated Creatinine Clearance: 52.5 mL/min (by C-G formula based on Cr of 1.56).   Medications:  Heparin 1500 units/hr  Assessment: 68yom on heparin bridging to Coumadin for Afib and suspected coronary embolism. Patient had previously been on Coumadin (6mg  daily) but was taken off ~2-3 weeks ago due to GI bleed. OK per Cardiology to restart Coumadin. Heparin level (0.42) is now back to therapeutic levels after slight increase. INR (1.21) is subtherapeutic and has not responded to Coumadin 5mg  - will increase dose and follow-up AM labs. - H/H and Plts stable - No significant bleeding reported  Goal of Therapy:  INR 2-3 Heparin level 0.3-0.7 units/ml Monitor platelets by anticoagulation protocol: Yes   Plan:  1. Continue heparin drip 1500 units/hr (15 ml/hr) 2. Coumadin 7.5mg  PO x 1 today 3. Daily heparin level, INR, and CBC 4. Monitor for s/sx of bleeding  [name], [place] Robert   [phone] 05/23/2014,11:26 AM

## 2014-05-23 NOTE — Plan of Care (Signed)
Problem: Phase I Progression Outcomes Goal: Initial discharge plan identified Outcome: Completed/Met Date Met:  05/23/14 Goal: Post Cath/PCI return to appropriate Path Outcome: Completed/Met Date Met:  05/23/14

## 2014-05-24 DIAGNOSIS — Z7901 Long term (current) use of anticoagulants: Secondary | ICD-10-CM

## 2014-05-24 LAB — GLUCOSE, CAPILLARY
GLUCOSE-CAPILLARY: 147 mg/dL — AB (ref 70–99)
Glucose-Capillary: 132 mg/dL — ABNORMAL HIGH (ref 70–99)
Glucose-Capillary: 140 mg/dL — ABNORMAL HIGH (ref 70–99)
Glucose-Capillary: 184 mg/dL — ABNORMAL HIGH (ref 70–99)

## 2014-05-24 LAB — CBC
HEMATOCRIT: 33 % — AB (ref 39.0–52.0)
HEMOGLOBIN: 10.4 g/dL — AB (ref 13.0–17.0)
MCH: 24 pg — ABNORMAL LOW (ref 26.0–34.0)
MCHC: 31.5 g/dL (ref 30.0–36.0)
MCV: 76.2 fL — ABNORMAL LOW (ref 78.0–100.0)
Platelets: 192 10*3/uL (ref 150–400)
RBC: 4.33 MIL/uL (ref 4.22–5.81)
RDW: 15 % (ref 11.5–15.5)
WBC: 4.4 10*3/uL (ref 4.0–10.5)

## 2014-05-24 LAB — HEPARIN LEVEL (UNFRACTIONATED)
HEPARIN UNFRACTIONATED: 0.38 [IU]/mL (ref 0.30–0.70)
Heparin Unfractionated: 0.23 IU/mL — ABNORMAL LOW (ref 0.30–0.70)
Heparin Unfractionated: 0.53 IU/mL (ref 0.30–0.70)

## 2014-05-24 LAB — PROTIME-INR
INR: 1.38 (ref 0.00–1.49)
Prothrombin Time: 17.1 seconds — ABNORMAL HIGH (ref 11.6–15.2)

## 2014-05-24 MED ORDER — WARFARIN SODIUM 7.5 MG PO TABS
7.5000 mg | ORAL_TABLET | Freq: Once | ORAL | Status: AC
  Administered 2014-05-24: 7.5 mg via ORAL
  Filled 2014-05-24: qty 1

## 2014-05-24 NOTE — Progress Notes (Signed)
ANTICOAGULATION CONSULT NOTE - FOLLOW UP    HL = 0.53 (goal 0.3 - 0.7 units/mL) Heparin dosing weight = 95 kg   Assessment: 68 YOM on heparin bridge for Afib and suspected coronary embolism.  Heparin level therapeutic; no bleeding reported.   Plan: - Continue heparin gtt at 1650 units/hr - F/U AM labs    Judine Arciniega D. Laney Potashang, PharmD, BCPS 05/24/2014, 8:23 PM

## 2014-05-24 NOTE — Progress Notes (Signed)
MD notified of HR 148 afib rvr per EKG, gave 2200 po metoprolol per request, will continue to monitor. Darrel HooverWilson,Keniya Schlotterbeck S

## 2014-05-24 NOTE — Progress Notes (Signed)
ANTICOAGULATION CONSULT NOTE - Follow Up Consult  Pharmacy Consult for Heparin  Indication: atrial fibrillation and probable coronary embolism  No Known Allergies  Patient Measurements: Height: 5\' 11"  (180.3 cm) Weight: 208 lb 8.9 oz (94.6 kg) IBW/kg (Calculated) : 75.3  Vital Signs: Temp: 98.3 F (36.8 C) (11/26 2121) Temp Source: Oral (11/26 2121) BP: 152/64 mmHg (11/26 2121) Pulse Rate: 52 (11/26 2121)  Labs:  Recent Labs  05/21/14 0530  05/22/14 0449 05/23/14 0325 05/23/14 1031 05/24/14 0322  HGB 10.6*  --  10.5* 10.5*  --   --   HCT 34.0*  --  33.4* 33.0*  --   --   PLT 229  --  220 219  --   --   LABPROT  --   --  14.9 15.4*  --  17.1*  INR  --   --  1.16 1.21  --  1.38  HEPARINUNFRC 0.14*  < > 0.43 0.25* 0.42 0.23*  CREATININE 1.44*  --  1.56*  --   --   --   TROPONINI >20.00*  --   --   --   --   --   < > = values in this interval not displayed.   Assessment: 68 y/o M on heparin/warfarin for afib/suspected coronary embolism. Sub-therapeutic heparin level this AM. Other labs as above. No issues per RN.   Goal of Therapy:  Heparin level 0.3-0.7 units/ml Monitor platelets by anticoagulation protocol: Yes   Plan:  -Increase heparin drip to 1650 units/hr -1200 HL -Daily CBC/HL -Monitor for bleeding  Abran DukeLedford, Gurney Balthazor 05/24/2014,4:32 AM

## 2014-05-24 NOTE — Progress Notes (Signed)
Patient is currently on heparin with coumadin bridge. Morning lopressor was held due to sinus bradycardia. Metoprolol was recently decreased from 50mg  BID to 25mg  BID as patient had bradycardia down to 30s on 50mg  BID dosing. Per nursing staff, patient went into a-fib with RVR today with HR 140s. I have instructed patient to start night time metoprolol now. If continue to be in RVR, will consider addition of amiodarone.   Ramond DialSigned, Waldine Zenz PA Pager: [phone]

## 2014-05-24 NOTE — Progress Notes (Signed)
ANTICOAGULATION CONSULT NOTE - Follow Up Consult  Pharmacy Consult for Heparin and Coumadin Indication: Afib, likely coronary embolism  No Known Allergies  Patient Measurements: Height: 5\' 11"  (180.3 cm) Weight: 208 lb 8.9 oz (94.6 kg) IBW/kg (Calculated) : 75.3 Heparin Dosing Weight: 94.6kg  Vital Signs: Temp: 98.5 F (36.9 C) (11/27 0440) Temp Source: Oral (11/27 0440) BP: 150/63 mmHg (11/27 1004) Pulse Rate: 57 (11/27 1004)  Labs:  Recent Labs  05/22/14 0449 05/23/14 0325 05/23/14 1031 05/24/14 0322 05/24/14 1145  HGB 10.5* 10.5*  --  10.4*  --   HCT 33.4* 33.0*  --  33.0*  --   PLT 220 219  --  192  --   LABPROT 14.9 15.4*  --  17.1*  --   INR 1.16 1.21  --  1.38  --   HEPARINUNFRC 0.43 0.25* 0.42 0.23* 0.38  CREATININE 1.56*  --   --   --   --     Estimated Creatinine Clearance: 52.5 mL/min (by C-G formula based on Cr of 1.56).   Medications:  Heparin 1650 units/hr  Assessment: 68yom on heparin bridging to Coumadin for Afib and suspected coronary embolism. Patient had previously been on Coumadin (6mg  daily) but was taken off ~2-3 weeks ago due to GI bleed. Cardiology ordered to restart Coumadin on 11/23. Heparin level (0.38) is therapeutic again after rate increase - will continue current rate and check follow-up level this evening since levels have been so labile recently. INR (1.38) is subtherapeutic but trended up appropriately with Coumadin 7.5mg  - will repeat and follow-up AM labs.  - H/H stable, Plts slow trend down - monitor - FOB negative - No significant bleeding reported  Goal of Therapy:  INR 2-3 Heparin level 0.3-0.7 units/ml Monitor platelets by anticoagulation protocol: Yes   Plan:  1. Continue heparin drip 1650 units/hr (16.5 ml/hr) - will check follow-up level this evening to confirm therapeutic since levels have been so labile 2. Repeat Coumadin 7.5mg  PO x 1 today 3. Daily heparin level, INR, and CBC 4. Monitor for s/sx of  bleeding  [name],  Robert  [phone] 05/24/2014,1:18 PM

## 2014-05-24 NOTE — Progress Notes (Signed)
SUBJECTIVE:   68 y.o. male w/ PMHx significant for atrial fibrillation, CHF, and GI bleeding who [facility] with complaints of neck pain. He was [facility] at [facility]. He was noted to have anterolateral ST-T wave changes concerning for acute ischemia but not diagnostic of STEMI. However, he developed wide-complex tachycardia c/w VT requiring DCCV and IV amiodarone, converting back to sinus rhythm. A Code STEMI was paged out because of ongoing ischemic symptoms and VT arrest. At cath, he was found to have an emboli that obstructed a branch of his Lcx. Started on IV heparin bridge with coumadin  Denies any CP or SOB. Did not sleep much last night. Want to go home.     OBJECTIVE:   Vitals:   Filed Vitals:   05/23/14 1429 05/23/14 2011 05/23/14 2121 05/24/14 0440  BP: 145/60  152/64 144/64  Pulse: 54 58 52 57  Temp: 98.5 F (36.9 C)  98.3 F (36.8 C) 98.5 F (36.9 C)  TempSrc: Oral  Oral Oral  Resp: 18  16 16   Height:      Weight:      SpO2: 100%  100% 99%   I&O's:    Intake/Output Summary (Last 24 hours) at 05/24/14 0853 Last data filed at 05/24/14 16100611  Gross per 24 hour  Intake  664.5 ml  Output   1250 ml  Net -585.5 ml   TELEMETRY: Reviewed telemetry pt in SB with HR 50s   PHYSICAL EXAM BP 144/64 mmHg  Pulse 57  Temp(Src) 98.5 F (36.9 C) (Oral)  Resp 16  Ht 5\' 11"  (1.803 m)  Wt 208 lb 8.9 oz (94.6 kg)  BMI 29.10 kg/m2  SpO2 99% General: Well developed, well nourished, in no acute distress Head:   Normal cephalic and atramatic  Lungs:  Clear bilaterally to auscultation. Heart:  HRRR S1 S2  No JVD.   Abdomen: Abdomen soft and non-tender Msk:  Back normal,  Normal strength and tone for age. Extremities:   No edema.  R radial cath site well healed Neuro: Alert and oriented. Psych:  Normal affect, responds appropriately Skin: No rash   LABS: Basic Metabolic Panel:  Recent Labs  96/09/5409/25/15 0449  NA 138  K 4.4    CL 102  CO2 24  GLUCOSE 159*  BUN 25*  CREATININE 1.56*  CALCIUM 8.9   Liver Function Tests:  Recent Labs  05/22/14 0449  AST 89*  ALT 53  ALKPHOS 46  BILITOT 0.3  PROT 7.1  ALBUMIN 3.5   CBC:  Recent Labs  05/23/14 0325 05/24/14 0322  WBC 4.9 4.4  HGB 10.5* 10.4*  HCT 33.0* 33.0*  MCV 76.6* 76.2*  PLT 219 192   Thyroid Function Tests:  Recent Labs  05/21/14 0910  TSH 0.398   Anemia Panel:  Recent Labs  05/22/14 0449  VITAMINB12 406  FOLATE 18.6  FERRITIN 46  TIBC 256  IRON 34*  RETICCTPCT 1.7   Coag Panel:   Lab Results  Component Value Date   INR 1.38 05/24/2014   INR 1.21 05/23/2014   INR 1.16 05/22/2014    RADIOLOGY: Portable Chest X-ray 1 View  05/21/2014   CLINICAL DATA:  Non ST elevated myocardial infarction  EXAM: PORTABLE CHEST - 1 VIEW  COMPARISON:  May 20, 2014  FINDINGS: There is no edema or consolidation. Heart is enlarged with pulmonary vascularity within normal limits. No pneumothorax. No adenopathy. No bone lesions.  IMPRESSION: Cardiomegaly, stable.  No edema or consolidation.   Electronically Signed   By: Bretta BangWilliam  Woodruff M.D.   On: 05/21/2014 07:36   Principal Problem:   NSTEMI (non-ST elevated myocardial infarction) Active Problems:   Ventricular tachycardia   Essential hypertension   CKD (chronic kidney disease) stage 3, GFR 30-59 ml/min   Hyperlipemia   Diabetes mellitus   Anemia     ASSESSMENT: 68 yo man with pmh of HTN, HL, CAD, CKD Stage 3, afib not on anticoagulant due to recurrent upper GI bleeding, and  DM  who presented with neck pain found to have vtach arrest s/p DCCM and IV amiodarone requiring emergent catherization that revealed acute occlusion of left circumflex/obtuse marginal subbranch Y due to cardiac embolus and mild to moderate CAD.   PLAN:    Acute NSTEMI in setting of occlusive cardiac embolus and CAD- Currently CP-free. Troponins elevated >20. Hold aspirin 81 mg daily due to increased  risk of bleeding. Lopressor decreased to 25mg  BID due to significant bradycardia down to 30-40s. ARB held due to acute on chronic renal insufficiency. Recheck BMET tomorrow  Vtach Arrest s/p DCCV - Pt currently in SB with HR 50s   Paroxysmal Atrial Fibrillation - Currently in SB with normal HR. [facility] (cou[facility]in) but was taken off due to recurrent upper GI bleeding. Last EGD reported to have esophagitis and clean based gastric ulcers. Due to occlusive cardiac embolus, pt currently on IV heparin and coumadin.    TSH = .398   Coumadin per pharmacy . INR is 1.38 today   History of CHF -  Normal LV  - 55-60%.  Grade 2 diastolic dysfunction.  Pro-BNP mildly elevated at 522 with no pulmonary edema on CXR. Currently on lopressor 25 mg BID. Pt at home on lopressor 25 mg BID, hydralazine 10 mg QID, and valsartan-HCTZ 320-25 mg daily.    Hypertension - mildly hypertensive  Hyperlipidemia - Lipid panel with LD 63 at goal <70. Continue lipitor 40 mg daily. Pt at home on simvastatin 10 mg daily.  Microcytic Anemia - Hg 10.6 with unknown baseline. Pt with history of upper GI bleeding with last EGD reported to have esophagitis and clean based gastric ulcers. Per family normal colonoscopy within last 10 years. Monitor for bleeding with IV heparin/coumadin.   Continue to monitor CBC.  CKD Stage 3 - Cr 1.44 with unknown baseline. Continue to monitor renal function. Hold home valsartan 320 mg daily if AKI on CKD.   Non-Insulin Dependent Type II DM - A1c pending. Pt at home on glipizide 10 mg in AM and 5 mg in PM. Currently on moderate SSI with meals and at bedtime.    Ramond DialSigned, Hao Meng PA Pager: [phone]   Attending Note:   The patient was seen and examined.  Agree with assessment and plan as noted above.  Changes made to the above note as needed.  He is feeling well. No syncope. No CP. INR is slowly increasing.  We had a nice discussion about coumadin management - Instructed  him to be consistent with his green vegetables.  I anticipate that he will go home in the next day or so.   Vesta MixerPhilip J. Nahser, Montez HagemanJr., MD, [facility] 05/24/2014, 9:44 AM [address],  [address] Office [phone] Pager [phone]

## 2014-05-25 DIAGNOSIS — N183 Chronic kidney disease, stage 3 (moderate): Secondary | ICD-10-CM

## 2014-05-25 LAB — BASIC METABOLIC PANEL
Anion gap: 14 (ref 5–15)
BUN: 16 mg/dL (ref 6–23)
CHLORIDE: 104 meq/L (ref 96–112)
CO2: 20 mEq/L (ref 19–32)
Calcium: 8.7 mg/dL (ref 8.4–10.5)
Creatinine, Ser: 1.34 mg/dL (ref 0.50–1.35)
GFR calc non Af Amer: 52 mL/min — ABNORMAL LOW (ref >90)
GFR, EST AFRICAN AMERICAN: 61 mL/min — AB (ref >90)
Glucose, Bld: 151 mg/dL — ABNORMAL HIGH (ref 70–99)
POTASSIUM: 4.5 meq/L (ref 3.7–5.3)
SODIUM: 138 meq/L (ref 137–147)

## 2014-05-25 LAB — HEPARIN LEVEL (UNFRACTIONATED)
HEPARIN UNFRACTIONATED: 0.39 [IU]/mL (ref 0.30–0.70)
Heparin Unfractionated: 0.28 IU/mL — ABNORMAL LOW (ref 0.30–0.70)

## 2014-05-25 LAB — CBC
HEMATOCRIT: 32.3 % — AB (ref 39.0–52.0)
HEMOGLOBIN: 10.1 g/dL — AB (ref 13.0–17.0)
MCH: 23.3 pg — ABNORMAL LOW (ref 26.0–34.0)
MCHC: 31.3 g/dL (ref 30.0–36.0)
MCV: 74.4 fL — ABNORMAL LOW (ref 78.0–100.0)
Platelets: 211 10*3/uL (ref 150–400)
RBC: 4.34 MIL/uL (ref 4.22–5.81)
RDW: 14.9 % (ref 11.5–15.5)
WBC: 4.5 10*3/uL (ref 4.0–10.5)

## 2014-05-25 LAB — GLUCOSE, CAPILLARY
GLUCOSE-CAPILLARY: 138 mg/dL — AB (ref 70–99)
GLUCOSE-CAPILLARY: 189 mg/dL — AB (ref 70–99)
Glucose-Capillary: 141 mg/dL — ABNORMAL HIGH (ref 70–99)
Glucose-Capillary: 131 mg/dL — ABNORMAL HIGH (ref 70–99)

## 2014-05-25 LAB — PROTIME-INR
INR: 1.49 (ref 0.00–1.49)
PROTHROMBIN TIME: 18.1 s — AB (ref 11.6–15.2)

## 2014-05-25 MED ORDER — HEPARIN (PORCINE) IN NACL 100-0.45 UNIT/ML-% IJ SOLN
1900.0000 [IU]/h | INTRAMUSCULAR | Status: DC
  Administered 2014-05-25: 1750 [IU]/h via INTRAVENOUS
  Administered 2014-05-26: 1900 [IU]/h via INTRAVENOUS
  Administered 2014-05-26: 1750 [IU]/h via INTRAVENOUS
  Filled 2014-05-25 (×6): qty 250

## 2014-05-25 MED ORDER — WARFARIN SODIUM 7.5 MG PO TABS
7.5000 mg | ORAL_TABLET | Freq: Once | ORAL | Status: AC
  Administered 2014-05-25: 7.5 mg via ORAL
  Filled 2014-05-25: qty 1

## 2014-05-25 NOTE — Plan of Care (Signed)
Problem: Phase II Progression Outcomes Goal: Pain controlled with appropriate interventions Outcome: Completed/Met Date Met:  05/25/14 Goal: Tolerates diet Outcome: Completed/Met Date Met:  05/25/14 Goal: Vascular site scale level 0 - I Vascular Site Scale Level 0: No bruising/bleeding/hematoma Level I (Mild): Bruising/Ecchymosis, minimal bleeding/ooozing, palpable hematoma < 3 cm Level II (Moderate): Bleeding not affecting hemodynamic parameters, pseudoaneurysm, palpable hematoma > 3 cm Level III (Severe) Bleeding which affects hemodynamic parameters or retroperitoneal hemorrhage  Outcome: Completed/Met Date Met:  05/25/14

## 2014-05-25 NOTE — Progress Notes (Signed)
ANTICOAGULATION CONSULT NOTE - Follow Up Consult  Pharmacy Consult for heparin Indication: atrial fibrillation  No Known Allergies  Patient Measurements: Height: 5\' 11"  (180.3 cm) Weight: 208 lb 8.9 oz (94.6 kg) IBW/kg (Calculated) : 75.3 Heparin Dosing Weight: 94.6 kg  Vital Signs: Temp: 98.3 F (36.8 C) (11/28 1435) Temp Source: Oral (11/28 1435) BP: 144/57 mmHg (11/28 1435) Pulse Rate: 54 (11/28 1435)  Labs:  Recent Labs  05/23/14 0325  05/24/14 0322  05/24/14 1949 05/25/14 0250 05/25/14 1923  HGB 10.5*  --  10.4*  --   --  10.1*  --   HCT 33.0*  --  33.0*  --   --  32.3*  --   PLT 219  --  192  --   --  211  --   LABPROT 15.4*  --  17.1*  --   --  18.1*  --   INR 1.21  --  1.38  --   --  1.49  --   HEPARINUNFRC 0.25*  < > 0.23*  < > 0.53 0.28* 0.39  CREATININE  --   --   --   --   --  1.34  --   < > = values in this interval not displayed.  Estimated Creatinine Clearance: 61.1 mL/min (by C-G formula based on Cr of 1.34).   Medications:  Infusions:  . heparin  1750 unitshr    Assessment: 68 yom continues on IV heparin for afib. Heparin level is therapeutic at 0.39. No bleeding noted.   Goal of Therapy:  Heparin level 0.3-0.7 units/ml  Monitor platelets by anticoagulation protocol: Yes   Plan:  1. Continue heparin gtt 1750 units/hr 2. F/u AM heparin level to confirm dosing  Lysle Pearlachel Noemie Devivo, PharmD, BCPS Pager # [phone] 05/25/2014 8:19 PM

## 2014-05-25 NOTE — Progress Notes (Signed)
SUBJECTIVE:   68 y.o. male w/ PMHx significant for atrial fibrillation, CHF, and GI bleedi71ng who presented to [facility] on 05/20/2014 with complaints of neck pain. He was [facility] at [facility]. He was noted to have anterolateral ST-T wave changes concerning for acute ischemia but not diagnostic of STEMI. However, he developed wide-complex tachycardia c/w VT requiring DCCV and IV amiodarone, converting back to sinus rhythm. A Code STEMI was paged out because of ongoing ischemic symptoms and VT arrest. At cath, he was found to have an emboli that obstructed a branch of his Lcx. Started on IV heparin bridge with coumadin  He is doing well . Ambulating without problems. Had transient atrial fib last night. Has converted to NSR.     OBJECTIVE:   Vitals:   Filed Vitals:   05/24/14 1817 05/24/14 2052 05/24/14 2303 05/25/14 0448  BP: 169/84 153/85 135/57 142/83  Pulse: 126 74 56 50  Temp:  98.5 F (36.9 C)  98.5 F (36.9 C)  TempSrc:  Oral  Oral  Resp:  18  18  Height:      Weight:      SpO2:  99%  100%   I&O's:    Intake/Output Summary (Last 24 hours) at 05/25/14 0735 Last data filed at 05/25/14 0449  Gross per 24 hour  Intake    480 ml  Output    700 ml  Net   -220 ml   TELEMETRY: Reviewed telemetry pt in SB with HR 50s   PHYSICAL EXAM BP 142/83 mmHg  Pulse 50  Temp(Src) 98.5 F (36.9 C) (Oral)  Resp 18  Ht 5\' 11"  (1.803 m)  Wt 208 lb 8.9 oz (94.6 kg)  BMI 29.10 kg/m2  SpO2 100% General: Well developed, well nourished, in no acute distress Head:   Normal cephalic and atramatic  Lungs:  Clear bilaterally to auscultation. Heart:  HRRR S1 S2  No JVD.   Abdomen: Abdomen soft and non-tender Msk:  Back normal,  Normal strength and tone for age. Extremities:   No edema.  R radial cath site well healed Neuro: Alert and oriented. Psych:  Normal affect, responds appropriately Skin: No rash   LABS: Basic Metabolic Panel:  Recent Labs  16/03/9610/28/15 0250    NA 138  K 4.5  CL 104  CO2 20  GLUCOSE 151*  BUN 16  CREATININE 1.34  CALCIUM 8.7   Liver Function Tests: No results for input(s): AST, ALT, ALKPHOS, BILITOT, PROT, ALBUMIN in the last 72 hours. CBC:  Recent Labs  05/24/14 0322 05/25/14 0250  WBC 4.4 4.5  HGB 10.4* 10.1*  HCT 33.0* 32.3*  MCV 76.2* 74.4*  PLT 192 211   Thyroid Function Tests: No results for input(s): TSH, T4TOTAL, T3FREE, THYROIDAB in the last 72 hours.  Invalid input(s): FREET3 Anemia Panel: No results for input(s): VITAMINB12, FOLATE, FERRITIN, TIBC, IRON, RETICCTPCT in the last 72 hours. Coag Panel:   Lab Results  Component Value Date   INR 1.49 05/25/2014   INR 1.38 05/24/2014   INR 1.21 05/23/2014    RADIOLOGY: Portable Chest X-ray 1 View  05/21/2014   CLINICAL DATA:  Non ST elevated myocardial infarction  EXAM: PORTABLE CHEST - 1 VIEW  COMPARISON:  May 20, 2014  FINDINGS: There is no edema or consolidation. Heart is enlarged with pulmonary vascularity within normal limits. No pneumothorax. No adenopathy. No bone lesions.  IMPRESSION: Cardiomegaly, stable.  No edema or consolidation.   Electronically Signed   By:  Bretta BangWilliam  Woodruff M.D.   On: 05/21/2014 07:36   Principal Problem:   NSTEMI (non-ST elevated myocardial infarction) Active Problems:   Ventricular tachycardia   Essential hypertension   CKD (chronic kidney disease) stage 3, GFR 30-59 ml/min   Hyperlipemia   Diabetes mellitus   Anemia     ASSESSMENT: 68 yo man with pmh of HTN, HL, CAD, CKD Stage 3, afib not on anticoagulant due to recurrent upper GI bleeding, and  DM  who presented with neck pain found to have vtach arrest s/p DCCM and IV amiodarone requiring emergent catherization that revealed acute occlusion of left circumflex/obtuse marginal subbranch Y due to cardiac embolus and mild to moderate CAD.   PLAN:    Acute NSTEMI in setting of occlusive cardiac embolus and CAD- Currently CP-free. Troponins elevated >20.  Hold aspirin 81 mg daily due to increased risk of bleeding. Lopressor decreased to 25mg  BID due to significant bradycardia down to 30-40s. ARB held due to acute on chronic renal insufficiency.   Vtach Arrest s/p DCCV - Pt currently in SB with HR 50s   Paroxysmal Atrial Fibrillation - Currently in SB with normal HR. [facility] ([facility] but was taken off due to recurrent upper GI bleeding. Last EGD reported to have esophagitis and clean based gastric ulcers. Due to occlusive cardiac embolus, pt currently on IV heparin and coumadin.    TSH = .398   Coumadin per pharmacy . INR is 1.49 today . I would suggest coumadin 7. 5 mg again today  He can probably go home tomorrow. I suspect his INR will be high enough.   Goal INR is 2-3 .    History of CHF -  Normal LV  - 55-60%.  Grade 2 diastolic dysfunction.  Pro-BNP mildly elevated at 522 with no pulmonary edema on CXR. Currently on lopressor 25 mg BID. Pt at home on lopressor 25 mg BID, hydralazine 10 mg QID, and valsartan-HCTZ 320-25 mg daily.    Hypertension - fairly well controlled.   Hyperlipidemia - Lipid panel with LD 63 at goal <70. Continue lipitor 40 mg daily. Pt at home on simvastatin 10 mg daily.  Microcytic Anemia - Hg 10.6 with unknown baseline. Pt with history of upper GI bleeding with last EGD reported to have esophagitis and clean based gastric ulcers. Per family normal colonoscopy within last 10 years. Monitor for bleeding with IV heparin/coumadin.   Continue to monitor CBC.  CKD Stage 3 - Cr 1.44 with unknown baseline. Continue to monitor renal function. Hold home valsartan 320 mg daily if AKI on CKD.   Non-Insulin Dependent Type II DM - A1c pending. Pt at home on glipizide 10 mg in AM and 5 mg in PM. Currently on moderate SSI with meals and at bedtime.   Vesta MixerPhilip J. Nahser, Montez HagemanJr., MD, [facility] 05/25/2014, 7:41 AM [address],  [address] Office [phone] Pager [phone]

## 2014-05-25 NOTE — Progress Notes (Signed)
ANTICOAGULATION CONSULT NOTE - Follow Up Consult  Pharmacy Consult for heparin/coumadin Indication: atrial fibrillation  No Known Allergies  Patient Measurements: Height: 5\' 11"  (180.3 cm) Weight: 208 lb 8.9 oz (94.6 kg) IBW/kg (Calculated) : 75.3 Heparin Dosing Weight: 94.6 kg  Vital Signs: Temp: 98.5 F (36.9 C) (11/28 0448) Temp Source: Oral (11/28 0448) BP: 142/83 mmHg (11/28 0448) Pulse Rate: 50 (11/28 0448)  Labs:  Recent Labs  05/23/14 0325  05/24/14 0322 05/24/14 1145 05/24/14 1949 05/25/14 0250  HGB 10.5*  --  10.4*  --   --  10.1*  HCT 33.0*  --  33.0*  --   --  32.3*  PLT 219  --  192  --   --  211  LABPROT 15.4*  --  17.1*  --   --  18.1*  INR 1.21  --  1.38  --   --  1.49  HEPARINUNFRC 0.25*  < > 0.23* 0.38 0.53 0.28*  CREATININE  --   --   --   --   --  1.34  < > = values in this interval not displayed.  Estimated Creatinine Clearance: 61.1 mL/min (by C-G formula based on Cr of 1.34).   Medications:  Infusions:  . heparin  1750 unitshr    Assessment: 68 yom on heparin bridging to Coumadin for Afib and suspected coronary embolism. Patient had previously been on Coumadin (6mg  daily) but was taken off ~2-3 weeks ago due to GI bleed. Cardiology ordered to restart Coumadin on 11/23.   Heparin level (0.28) is slightly subtherapeutic on heparin gtt.  No bleeding or complications noted.  INR (1.49) is subtherapeutic but trending up appropriately with Coumadin 7.5mg  - will repeat and follow-up AM labs.   - H/H stable, Platelet count stable - No significant bleeding reported  Goal of Therapy:  Heparin level 0.3-0.7 units/ml  INR 2-3 Monitor platelets by anticoagulation protocol: Yes   Plan:  1. Increase IV heparin to 1750 units/hr. 2. Recheck heparin level in 6 hrs. 3. Coumadin 7.5 mg again tonight, then can likely reduce to 6 mg daily tomorrow. 4. Continue daily INR, heparin level, and CBC.  Tad MooreJessica Matty Vanroekel, Pharm D, BCPS  Clinical  Pharmacist Pager [phone]  05/25/2014 10:34 AM

## 2014-05-26 LAB — CBC
HCT: 32.7 % — ABNORMAL LOW (ref 39.0–52.0)
Hemoglobin: 10.4 g/dL — ABNORMAL LOW (ref 13.0–17.0)
MCH: 24.1 pg — ABNORMAL LOW (ref 26.0–34.0)
MCHC: 31.8 g/dL (ref 30.0–36.0)
MCV: 75.9 fL — ABNORMAL LOW (ref 78.0–100.0)
Platelets: 240 10*3/uL (ref 150–400)
RBC: 4.31 MIL/uL (ref 4.22–5.81)
RDW: 14.7 % (ref 11.5–15.5)
WBC: 4.7 10*3/uL (ref 4.0–10.5)

## 2014-05-26 LAB — GLUCOSE, CAPILLARY
GLUCOSE-CAPILLARY: 208 mg/dL — AB (ref 70–99)
Glucose-Capillary: 136 mg/dL — ABNORMAL HIGH (ref 70–99)
Glucose-Capillary: 145 mg/dL — ABNORMAL HIGH (ref 70–99)
Glucose-Capillary: 166 mg/dL — ABNORMAL HIGH (ref 70–99)

## 2014-05-26 LAB — HEPARIN LEVEL (UNFRACTIONATED)
HEPARIN UNFRACTIONATED: 0.41 [IU]/mL (ref 0.30–0.70)
Heparin Unfractionated: 0.23 IU/mL — ABNORMAL LOW (ref 0.30–0.70)
Heparin Unfractionated: 0.48 IU/mL (ref 0.30–0.70)

## 2014-05-26 LAB — PROTIME-INR
INR: 1.8 — AB (ref 0.00–1.49)
PROTHROMBIN TIME: 21 s — AB (ref 11.6–15.2)

## 2014-05-26 MED ORDER — WARFARIN SODIUM 7.5 MG PO TABS
7.5000 mg | ORAL_TABLET | Freq: Once | ORAL | Status: AC
  Administered 2014-05-26: 7.5 mg via ORAL
  Filled 2014-05-26: qty 1

## 2014-05-26 MED ORDER — FERROUS SULFATE 325 (65 FE) MG PO TABS
325.0000 mg | ORAL_TABLET | Freq: Three times a day (TID) | ORAL | Status: DC
  Administered 2014-05-26 – 2014-05-27 (×4): 325 mg via ORAL
  Filled 2014-05-26 (×6): qty 1

## 2014-05-26 MED ORDER — SODIUM CHLORIDE 0.9 % IV SOLN
1020.0000 mg | Freq: Once | INTRAVENOUS | Status: AC
  Administered 2014-05-26: 1020 mg via INTRAVENOUS
  Filled 2014-05-26: qty 34

## 2014-05-26 NOTE — Progress Notes (Signed)
ANTICOAGULATION CONSULT NOTE - Follow Up Consult  Pharmacy Consult for heparin/Coumadin Indication: atrial fibrillation  No Known Allergies  Patient Measurements: Height: 5\' 11"  (180.3 cm) Weight: 208 lb 8.9 oz (94.6 kg) IBW/kg (Calculated) : 75.3 Heparin Dosing Weight: 94.6 kg  Vital Signs: Temp: 98.5 F (36.9 C) (11/28 2100) Temp Source: Oral (11/28 2100) BP: 146/58 mmHg (11/28 2100) Pulse Rate: 49 (11/28 2100)  Labs:  Recent Labs  05/24/14 0322  05/25/14 0250 05/25/14 1923 05/26/14 0340  HGB 10.4*  --  10.1*  --   --   HCT 33.0*  --  32.3*  --   --   PLT 192  --  211  --   --   LABPROT 17.1*  --  18.1*  --  21.0*  INR 1.38  --  1.49  --  1.80*  HEPARINUNFRC 0.23*  < > 0.28* 0.39 0.23*  CREATININE  --   --  1.34  --   --   < > = values in this interval not displayed.  Estimated Creatinine Clearance: 61.1 mL/min (by C-G formula based on Cr of 1.34).   Assessment: 68 yo male with Afib for anticoagulation.  Heparin level subtherapeutic.  INR starting to increase toward goal.  Goal of Therapy:  INR 2-3 Heparin level 0.3-0.7 units/ml  Monitor platelets by anticoagulation protocol: Yes   Plan:  Increase Heparin 1900 units/hr Coumadin 7.5 mg today  Geannie RisenGreg Rally Ouch, PharmD, BCPS  05/26/2014 4:55 AM

## 2014-05-26 NOTE — Progress Notes (Signed)
Primary cardiologist : Celesta GentileKowolski - [place]  SUBJECTIVE:   68 y.o. male w/ PMHx significant for atrial fibrillation, CHF, and GI bleeding who [facility] with complaints of neck pain. He was initially treated at [facility]. He was noted to have anterolateral ST-T wave changes concerning for acute ischemia but not diagnostic of STEMI. However, he developed wide-complex tachycardia c/w VT requiring DCCV and IV amiodarone, converting back to sinus rhythm. A Code STEMI was paged out because of ongoing ischemic symptoms and VT arrest. At cath, he was found to have an emboli that obstructed a branch of his Lcx. Started on IV heparin bridge with coumadin  He is doing well . Ambulating without problems. Had transient atrial fib last night. Has converted to NSR.   Wife says that he snores and also has apneic episodes at night.  INR is still subtheraputic - getting heparin and coumadin.  He had a thromboembolus to his coronary artery which caused a cardiac arrest.     OBJECTIVE:   Vitals:   Filed Vitals:   05/24/14 2303 05/25/14 0448 05/25/14 1435 05/25/14 2100  BP: 135/57 142/83 144/57 146/58  Pulse: 56 50 54 49  Temp:  98.5 F (36.9 C) 98.3 F (36.8 C) 98.5 F (36.9 C)  TempSrc:  Oral Oral Oral  Resp:  18 18 18   Height:      Weight:      SpO2:  100% 100% 100%   I&O's:    Intake/Output Summary (Last 24 hours) at 05/26/14 0739 Last data filed at 05/25/14 2300  Gross per 24 hour  Intake    720 ml  Output   1500 ml  Net   -780 ml   TELEMETRY: Reviewed telemetry pt in SB with HR 50s   PHYSICAL EXAM BP 146/58 mmHg  Pulse 49  Temp(Src) 98.5 F (36.9 C) (Oral)  Resp 18  Ht 5\' 11"  (1.803 m)  Wt 208 lb 8.9 oz (94.6 kg)  BMI 29.10 kg/m2  SpO2 100% General: Well developed, well nourished, in no acute distress Head:   Normal cephalic and atramatic  Lungs:  Clear bilaterally to auscultation. Heart:  HRRR S1 S2  No JVD.   Abdomen: Abdomen soft  and non-tender Msk:  Back normal,  Normal strength and tone for age. Extremities:   No edema.  R radial cath site well healed Neuro: Alert and oriented. Psych:  Normal affect, responds appropriately Skin: No rash   LABS: Basic Metabolic Panel:  Recent Labs  16/03/9610/28/15 0250  NA 138  K 4.5  CL 104  CO2 20  GLUCOSE 151*  BUN 16  CREATININE 1.34  CALCIUM 8.7   Liver Function Tests: No results for input(s): AST, ALT, ALKPHOS, BILITOT, PROT, ALBUMIN in the last 72 hours. CBC:  Recent Labs  05/25/14 0250 05/26/14 0340  WBC 4.5 4.7  HGB 10.1* 10.4*  HCT 32.3* 32.7*  MCV 74.4* 75.9*  PLT 211 240   Thyroid Function Tests: No results for input(s): TSH, T4TOTAL, T3FREE, THYROIDAB in the last 72 hours.  Invalid input(s): FREET3 Anemia Panel: No results for input(s): VITAMINB12, FOLATE, FERRITIN, TIBC, IRON, RETICCTPCT in the last 72 hours. Coag Panel:   Lab Results  Component Value Date   INR 1.80* 05/26/2014   INR 1.49 05/25/2014   INR 1.38 05/24/2014    RADIOLOGY: Portable Chest X-ray 1 View  05/21/2014   CLINICAL DATA:  Non ST elevated myocardial infarction  EXAM: PORTABLE CHEST - 1  VIEW  COMPARISON:  May 20, 2014  FINDINGS: There is no edema or consolidation. Heart is enlarged with pulmonary vascularity within normal limits. No pneumothorax. No adenopathy. No bone lesions.  IMPRESSION: Cardiomegaly, stable.  No edema or consolidation.   Electronically Signed   By: Bretta BangWilliam  Woodruff M.D.   On: 05/21/2014 07:36   Principal Problem:   NSTEMI (non-ST elevated myocardial infarction) Active Problems:   Ventricular tachycardia   Essential hypertension   CKD (chronic kidney disease) stage 3, GFR 30-59 ml/min   Hyperlipemia   Diabetes mellitus   Anemia     ASSESSMENT: 68 yo man with pmh of HTN, HL, CAD, CKD Stage 3, afib not on anticoagulant due to recurrent upper GI bleeding, and  DM  who presented with neck pain found to have vtach arrest s/p DCCM and IV  amiodarone requiring emergent catherization that revealed acute occlusion of left circumflex/obtuse marginal subbranch Y due to cardiac embolus and mild to moderate CAD.   PLAN:    Acute NSTEMI in setting of occlusive cardiac embolus and CAD- Currently CP-free. Continue coumadin / heparin.    Vtach Arrest s/p DCCV - Pt currently in SB with HR 50s   Paroxysmal Atrial Fibrillation - Currently in SB with normal HR. [facility] but was taken off due to recurrent upper GI bleeding. His Hb has remained stable  He will need a sleep study as OP .   Coumadin per pharmacy . INR is 1.80 today . I would suggest coumadin 7. 5 mg again today  He can probably go home tomorrow. I suspect his INR will be high enough.   Goal INR is 2-3 .    History of CHF -  Normal LV  - 55-60%.  Grade 2 diastolic dysfunction.  Pro-BNP mildly elevated at 522 with no pulmonary edema on CXR. Currently on lopressor 25 mg BID. Pt at home on lopressor 25 mg BID, hydralazine 10 mg QID, and valsartan-HCTZ 320-25 mg daily.    Hypertension - fairly well controlled.   Hyperlipidemia - Lipid panel with LD 63 at goal <70. Continue lipitor 40 mg daily.   Microcytic Anemia - Hg 10.6 with unknown baseline. Pt with history of upper GI bleeding with last EGD reported to have esophagitis and clean based gastric ulcers. Per family normal colonoscopy within last 10 years. Monitor for bleeding with IV heparin/coumadin.   Continue to monitor CBC.  CKD Stage 3 - Cr 1.44 with unknown baseline. Continue to monitor renal function.   He was on Valsartan but this has been on hold.  His LV function is normal.  Primary md to follow .   Non-Insulin Dependent Type II DM - A1c pending. Pt at home on glipizide 10 mg in AM and 5 mg in PM. Currently on moderate SSI with meals and at bedtime.   Anticipate DC tomorrow.  He will be followed in [place] by Dr. Joana ReamerKowolski.   Needs to be set up in their coumadin clinic.  Vesta MixerPhilip J.  Nahser, Montez HagemanJr., MD, [facility] 05/26/2014, 7:39 AM [address],  [address] Office [phone] Pager [phone]

## 2014-05-26 NOTE — Progress Notes (Signed)
ANTICOAGULATION CONSULT NOTE - Follow Up Consult  Pharmacy Consult for heparin Indication: atrial fibrillation  No Known Allergies  Patient Measurements: Height: 5\' 11"  (180.3 cm) Weight: 208 lb 8.9 oz (94.6 kg) IBW/kg (Calculated) : 75.3 Heparin Dosing Weight: 94.6 kg  Vital Signs: Pulse Rate: 50 (11/29 1000)  Labs:  Recent Labs  05/24/14 0322  05/25/14 0250 05/25/14 1923 05/26/14 0340 05/26/14 1218  HGB 10.4*  --  10.1*  --  10.4*  --   HCT 33.0*  --  32.3*  --  32.7*  --   PLT 192  --  211  --  240  --   LABPROT 17.1*  --  18.1*  --  21.0*  --   INR 1.38  --  1.49  --  1.80*  --   HEPARINUNFRC 0.23*  < > 0.28* 0.39 0.23* 0.48  CREATININE  --   --  1.34  --   --   --   < > = values in this interval not displayed.  Estimated Creatinine Clearance: 61.1 mL/min (by C-G formula based on Cr of 1.34).   Medications:  . heparin 1,900 Units/hr (05/26/14 0524)    Assessment: 68 yom continues on IV heparin for afib. Heparin level is therapeutic at 0.48. No bleeding noted.   Goal of Therapy:  Heparin level 0.3-0.7 units/ml  Monitor platelets by anticoagulation protocol: Yes   Plan:  1. Continue heparin gtt 1900 units/hr 2. Recheck heparin level in 6 hrs to confirm. 3. Continue daily heparin level and CBC.  Tad MooreJessica Braelynne Garinger, Pharm D, BCPS  Clinical Pharmacist Pager [phone]  05/26/2014 1:09 PM

## 2014-05-26 NOTE — Plan of Care (Signed)
Problem: Phase II Progression Outcomes Goal: Discharge plan in place and appropriate Outcome: Progressing Goal: Hemodynamically stable Outcome: Progressing Goal: Activity appropriate for discharge plan Outcome: Progressing

## 2014-05-26 NOTE — Progress Notes (Signed)
ANTICOAGULATION CONSULT NOTE - Follow Up Consult  Pharmacy Consult for heparin Indication: atrial fibrillation  No Known Allergies  Patient Measurements: Height: 5\' 11"  (180.3 cm) Weight: 208 lb 8.9 oz (94.6 kg) IBW/kg (Calculated) : 75.3 Heparin Dosing Weight: 94.6 kg  Vital Signs: Temp: 98.7 F (37.1 C) (11/29 1357) Temp Source: Oral (11/29 1357) BP: 145/63 mmHg (11/29 1357) Pulse Rate: 58 (11/29 1357)  Labs:  Recent Labs  05/24/14 0322  05/25/14 0250  05/26/14 0340 05/26/14 1218 05/26/14 1845  HGB 10.4*  --  10.1*  --  10.4*  --   --   HCT 33.0*  --  32.3*  --  32.7*  --   --   PLT 192  --  211  --  240  --   --   LABPROT 17.1*  --  18.1*  --  21.0*  --   --   INR 1.38  --  1.49  --  1.80*  --   --   HEPARINUNFRC 0.23*  < > 0.28*  < > 0.23* 0.48 0.41  CREATININE  --   --  1.34  --   --   --   --   < > = values in this interval not displayed.  Estimated Creatinine Clearance: 61.1 mL/min (by C-G formula based on Cr of 1.34).   Medications:  . heparin 1,900 Units/hr (05/26/14 0524)    Assessment: 68 yom continues on IV heparin for afib. Heparin level is therapeutic at 0.41. No bleeding noted.   Goal of Therapy:  Heparin level 0.3-0.7 units/ml  Monitor platelets by anticoagulation protocol: Yes   Plan:  1. Continue heparin gtt 1900 units/hr 2. Continue daily heparin level and CBC.  Tad MooreJessica Lynel Forester, Pharm D, BCPS  Clinical Pharmacist Pager [phone]  05/26/2014 7:36 PM

## 2014-05-27 DIAGNOSIS — D509 Iron deficiency anemia, unspecified: Secondary | ICD-10-CM

## 2014-05-27 LAB — PROTIME-INR
INR: 2.17 — ABNORMAL HIGH (ref 0.00–1.49)
PROTHROMBIN TIME: 24.4 s — AB (ref 11.6–15.2)

## 2014-05-27 LAB — GLUCOSE, CAPILLARY: GLUCOSE-CAPILLARY: 131 mg/dL — AB (ref 70–99)

## 2014-05-27 LAB — CBC
HCT: 32.2 % — ABNORMAL LOW (ref 39.0–52.0)
Hemoglobin: 10.2 g/dL — ABNORMAL LOW (ref 13.0–17.0)
MCH: 23.9 pg — AB (ref 26.0–34.0)
MCHC: 31.7 g/dL (ref 30.0–36.0)
MCV: 75.6 fL — ABNORMAL LOW (ref 78.0–100.0)
PLATELETS: 232 10*3/uL (ref 150–400)
RBC: 4.26 MIL/uL (ref 4.22–5.81)
RDW: 14.7 % (ref 11.5–15.5)
WBC: 4.4 10*3/uL (ref 4.0–10.5)

## 2014-05-27 LAB — HEMOGLOBIN A1C
HEMOGLOBIN A1C: 6.8 % — AB (ref <5.7)
Mean Plasma Glucose: 148 mg/dL — ABNORMAL HIGH (ref <117)

## 2014-05-27 LAB — HEPARIN LEVEL (UNFRACTIONATED): Heparin Unfractionated: 0.34 IU/mL (ref 0.30–0.70)

## 2014-05-27 MED ORDER — HYDRALAZINE HCL 25 MG PO TABS: 25.0000 mg | ORAL_TABLET | Freq: Three times a day (TID) | ORAL | Status: DC

## 2014-05-27 MED ORDER — WARFARIN SODIUM 5 MG PO TABS: 5.0000 mg | ORAL_TABLET | Freq: Every day | ORAL | Status: DC

## 2014-05-27 MED ORDER — NITROGLYCERIN 0.4 MG SL SUBL: 0.4000 mg | SUBLINGUAL_TABLET | SUBLINGUAL | Status: DC | PRN

## 2014-05-27 MED ORDER — HYDRALAZINE HCL 25 MG PO TABS
25.0000 mg | ORAL_TABLET | Freq: Three times a day (TID) | ORAL | Status: DC
  Administered 2014-05-27: 25 mg via ORAL
  Filled 2014-05-27 (×4): qty 1

## 2014-05-27 NOTE — Progress Notes (Signed)
Subjective: No CP, SOB, dizziness  Objective: Vital signs in last 24 hours: Temp:  [98 F (36.7 C)-98.9 F (37.2 C)] 98 F (36.7 C) (11/30 0510) Pulse Rate:  [50-60] 50 (11/30 0510) Resp:  [18] 18 (11/30 0510) BP: (142-157)/(59-64) 142/64 mmHg (11/30 0510) SpO2:  [94 %-100 %] 94 % (11/30 0510) Last BM Date: 05/26/14  Intake/Output from previous day: 11/29 0701 - 11/30 0700 In: 720 [P.O.:720] Out: 1800 [Urine:1800] Intake/Output this shift: Total I/O In: -  Out: 1250 [Urine:1250]  Medications Current Facility-Administered Medications  Medication Dose Route Frequency Provider Last Rate Last Dose  . 0.9 %  sodium chloride infusion  250 mL Intravenous PRN Jay BollmanMichael Cooper, MD      . acetaminophen (TYLENOL) tablet 650 mg  650 mg Oral Q4H PRN Jay BollmanMichael Cooper, MD   650 mg at 05/26/14 0046  . atorvastatin (LIPITOR) tablet 40 mg  40 mg Oral q1800 Jay BollmanMichael Cooper, MD   40 mg at 05/26/14 1723  . ferrous sulfate tablet 325 mg  325 mg Oral TID WC Jay MixerPhilip J Nahser, MD   325 mg at 05/26/14 1723  . heparin ADULT infusion 100 units/mL (25000 units/250 mL)  1,900 Units/hr Intravenous Continuous Jay BollmanMichael Cooper, MD 19 mL/hr at 05/26/14 2146 1,900 Units/hr at 05/26/14 2146  . hydrALAZINE (APRESOLINE) injection 10 mg  10 mg Intravenous Q4H PRN Jay BollmanMichael Cooper, MD      . insulin aspart (novoLOG) injection 0-15 Units  0-15 Units Subcutaneous TID [facility] Jay BollmanMichael Cooper, MD   3 Units at 05/26/14 1724  . insulin aspart (novoLOG) injection 0-5 Units  0-5 Units Subcutaneous QHS Jay BollmanMichael Cooper, MD   2 Units at 05/26/14 2155  . labetalol (NORMODYNE,TRANDATE) injection 10 mg  10 mg Intravenous Q2H PRN Jay BollmanMichael Cooper, MD      . metoprolol tartrate (LOPRESSOR) tablet 25 mg  25 mg Oral BID [name], [name]   25 mg at 05/25/14 1037  . nitroGLYCERIN (NITROSTAT) SL tablet 0.4 mg  0.4 mg Sublingual Q5 Min x 3 PRN Jay BollmanMichael Cooper, MD      . ondansetron [facility](ZOFRAN) injection 4 mg  4 mg Intravenous Q6H PRN Jay BollmanMichael  Cooper, MD      . oxyCODONE-acetaminophen (PERCOCET/ROXICET) 5-325 MG per tablet 1-2 tablet  1-2 tablet Oral Q4H PRN Jay BollmanMichael Cooper, MD      . sodium chloride 0.9 % injection 3 mL  3 mL Intravenous Q12H Jay BollmanMichael Cooper, MD   3 mL at 05/24/14 2310  . sodium chloride 0.9 % injection 3 mL  3 mL Intravenous PRN Jay BollmanMichael Cooper, MD      . Warfarin - Pharmacist Dosing Inpatient   Does not apply q1800 [name], [name]        PE: General appearance: alert, cooperative and no distress Lungs: clear to auscultation bilaterally Heart: regular rate and rhythm, S1, S2 normal, no murmur, click, rub or gallop Extremities: Trace LEE Pulses: 2+ and symmetric Skin: Warm and dry Neurologic: Grossly normal  Lab Results:   Recent Labs  05/25/14 0250 05/26/14 0340 05/27/14 0402  WBC 4.5 4.7 4.4  HGB 10.1* 10.4* 10.2*  HCT 32.3* 32.7* 32.2*  PLT 211 240 232   BMET  Recent Labs  05/25/14 0250  NA 138  K 4.5  CL 104  CO2 20  GLUCOSE 151*  BUN 16  CREATININE 1.34  CALCIUM 8.7   PT/INR  Recent Labs  05/25/14 0250 05/26/14 0340 05/27/14 0402  LABPROT 18.1* 21.0* 24.4*  INR 1.49 1.80* 2.17*  Lipid Panel     Component Value Date/Time   CHOL 128 05/21/2014 0530   TRIG 121 05/21/2014 0530   HDL 41 05/21/2014 0530   CHOLHDL 3.1 05/21/2014 0530   VLDL 24 05/21/2014 0530   LDLCALC 63 05/21/2014 0530     Assessment/Plan  68 yo man with pmh of HTN, HL, CAD, CKD Stage 3, afib not on anticoagulant due to recurrent upper GI bleeding, and DM who presented with neck pain found to have vtach arrest s/p DCCM and IV amiodarone requiring emergent catherization that revealed acute occlusion of left circumflex/obtuse marginal subbranch Y due to cardiac embolus and mild to moderate CAD.    NSTEMI (non-ST elevated myocardial infarction) EF 55-60%, G2DD   PAF Short run of afib yesterday.On lopressor 25 bid.  Bradycardia will limit BB increase.  INR therapeutic.  [place]      Ventricular tachycardia  He had a short run of tachycardia yesterday morning.    Bradycardia- 40-50's   Essential hypertension Needs better control.  Was on ARB, hydralazine 10 QID at home. SCr a little better.  Will add hydralazine 25mg  TID.       CKD (chronic kidney disease) stage 3, GFR 30-59 ml/min  SCr improved slightly.    Hyperlipemia  On lipitor   Diabetes mellitus  Restart PO meds at DC.  We did not check an A1C.    Anemia, microcytic  Hgb stable.  On Iron  He will be followed in  by Dr. Joana [name]. Needs to be set up in their coumadin clinic.   LOS: 7 days    [name], [name] 05/27/2014 6:30 AM  Attending Note:   The patient was seen and examined.  Agree with assessment and plan as noted above.  Changes made to the above note as needed.  INR is theraputic Answered questions about his activities and dietary restrictions.  We gave him IV iron and started him on iron tablets for his anemia. This will need to be followed closely since he is on coumadin.   Has ambulated in the halls.  OK for discharge.   Jay MixerPhilip J. [name], Jay HagemanJr., MD, [facility] 05/27/2014, 7:39 AM [address],  [address] Office [phone] Pager [phone]

## 2014-05-27 NOTE — Progress Notes (Signed)
Pt had a short burst of rapid atrial fib at 2137. Otherwise rhythm is sinus bradycardia. Pt has not been receiving metoprolol due to heart rate in the 50's, down to 40's when asleep.

## 2014-05-27 NOTE — Progress Notes (Signed)
Medicare Important Message given? YES  (If response is "NO", the following Medicare IM given date fields will be blank)  Date Medicare IM given: 05/27/14 Medicare IM given by:  Jairo Bellew  

## 2014-05-27 NOTE — Discharge Summary (Signed)
Physician Discharge Summary     Cardiologist:  Dr Gwen PoundsKowalski Patient ID: [name] MRN: [id number] DOB/AGE: 69/01/1946 69 y.o.  Admit date: 05/20/2014 Discharge date: 05/27/2014  Admission Diagnoses:  NSTEMI, VT  Discharge Diagnoses:  Principal Problem:   NSTEMI (non-ST elevated myocardial infarction) Active Problems:   Ventricular tachycardia   Essential hypertension   CKD (chronic kidney disease) stage 3, GFR 30-59 ml/min   Hyperlipemia   Diabetes mellitus   Anemia   Bradycardia   PAF  Discharged Condition: stable  Hospital Course:   68 y.o. male w/ PMHx significant for atrial fibrillation, CHF, and GI bleeding who [facility] with complaints of neck pain. [facility]. He was noted to have anterolateral ST-T wave changes concerning for acute ischemia but not diagnostic of STEMI. However, he developed wide-complex tachycardia c/w VT requiring DCCV and IV amiodarone, converting back to sinus rhythm. A Code STEMI was paged out because of ongoing ischemic symptoms and VT arrest.   The patient has a history of paroxysmal atrial fibrillation. He has previously been anticoagulated, but has had problems with gastrointestinal bleeding. He did have recent upper endoscopy demonstrating only esophagitis and gastric ulcers with clean bases. He has not had black stools or bright red blood in his stools over the last 2 weeks. He's had no chest pain, but describes neck tightness yesterday and earlier today without relief. He felt like it was difficult to swallow. He has no shortness of breath, palpitations, or syncope. However, he did have marked diaphoresis this afternoon when symptoms began.   The patient was taken directly to the cardiac catheterization lab for emergency cardiac cath and possible PCI.   The procedure revealed acute occlusion of a left circumflex/obtuse marginal subbranch Y clearly related to coronary embolism  Mild  to moderate diffuse coronary artery disease(See cath note below).  He was continued on IV amiodarone for a short time.  He was restarted on coumadin with heparin bridging.   We are avoiding ASA with history of GIB.  His Hgb remained stable throughout his admission and will need to be monitored closely.  2D echo revealed a normal EF and grade two diastolic dysfunction(see below).   He was put on beta blocker(lopressor 50) and lipitor.  His lopressor was later decreased to 25mg  BID due to bradycardia in the 30-40's during the day.   Valsartan/HCT were stopped due to CKD and hydralazine was restarted at 25mg  TID.  Lipids are well controlled and we will continue home statin at DC.  His iron panel revealed low iron so he was given IV first and started on PO.  TSH 0.398.  He had periodic Afib with RVR with spontaneous conversion In SR at DC.  He ambulated without difficulties. The patient was seen by Dr. Elease HashimotoNahser who felt he was stable for DC home.   Consults: None  Significant Diagnostic Studies:  BMET    Component Value Date/Time   NA 138 05/25/2014 0250   K 4.5 05/25/2014 0250   CL 104 05/25/2014 0250   CO2 20 05/25/2014 0250   GLUCOSE 151* 05/25/2014 0250   BUN 16 05/25/2014 0250   CREATININE 1.34 05/25/2014 0250   CALCIUM 8.7 05/25/2014 0250   GFRNONAA 52* 05/25/2014 0250   GFRAA 61* 05/25/2014 0250   CBC    Component Value Date/Time   WBC 4.4 05/27/2014 0402   RBC 4.26 05/27/2014 0402   RBC 4.34 05/22/2014 0449   HGB 10.2*  05/27/2014 0402   HCT 32.2* 05/27/2014 0402   PLT 232 05/27/2014 0402   MCV 75.6* 05/27/2014 0402   MCH 23.9* 05/27/2014 0402   MCHC 31.7 05/27/2014 0402   RDW 14.7 05/27/2014 0402    Iron/TIBC/Ferritin/ %Sat    Component Value Date/Time   IRON 34* 05/22/2014 0449   TIBC 256 05/22/2014 0449   FERRITIN 46 05/22/2014 0449   IRONPCTSAT 13* 05/22/2014 0449    Lipid Panel     Component Value Date/Time   CHOL 128 05/21/2014 0530   TRIG 121 05/21/2014 0530    HDL 41 05/21/2014 0530   CHOLHDL 3.1 05/21/2014 0530   VLDL 24 05/21/2014 0530   LDLCALC 63 05/21/2014 0530     Cardiac Catheterization Procedure Note  Name: MADDOX [name] MRN: [id number] DOB: 1945-10-04  Procedure: Left Heart Cath, Selective Coronary Angiography  Indication: Non-ST elevation MI. This 68 year old gentleman with history of atrial fibrillation and recurrent GI bleeding presented today with neck pain and ST segment changes on EKG. He developed VT/VF in the emergency department at [facility]. He required defibrillation and was also treated with IV amiodarone. The patient was transferred emergently for cardiac catheterization in the setting of ongoing ischemic symptoms.  Procedural Details: The right wrist was prepped, draped, and anesthetized with 1% lidocaine. Using the modified Seldinger technique, a 5/6 French Slender sheath was introduced into the right radial artery. 3 mg of verapamil was administered through the sheath, weight-based unfractionated heparin was administered intravenously. Standard Judkins catheters were used for selective coronary angiography. The LAD and left circumflex had separate ostia and ultimately a 5 [place] EBU catheter was used for left coronary artery angiography. The right coronary artery was difficult to find. I used in a AL2 catheter for nonselective imaging. Ventriculography was deferred because of elevated creatinine. Catheter exchanges were performed over an exchange length guidewire. There were no immediate procedural complications. A TR band was used for radial hemostasis at the completion of the procedure. The patient was transferred to the post catheterization recovery area for further monitoring.  Procedural Findings: Hemodynamics: AO 141/73 LV 145/16  Coronary angiography: Coronary dominance: left  Left mainstem: Very short segment. The LAD and left circumflex nearly  have separate ostia. There is no obstruction of the left main.  Left anterior descending (LAD): The LAD is diffusely diseased. The proximal vessel is widely patent. After the first septal perforator, there is 30% mid vessel stenosis. The LAD has Diffuse 50-70% stenosis at the apex. The vessel wraps around the left ventricular apex. There is moderate proximal LAD calcification present.  Left circumflex (LCx): The left circumflex is patent. The vessel is dominant. The proximal and mid circumflex are diffusely diseased. The mid circumflex has 50% stenosis before it divides into multiple OM branches. There are approximately 5 fairly equal sized subbranches of the first obtuse marginal. One of these subbranches is totally occluded in its distal aspect. This has the appearance of an abrupt cut off suspicious for coronary embolism.  Right coronary artery (RCA): This is a relatively small, nondominant vessel. There are 2 RV marginal branches supplied. There is no high-grade stenosis present.  Left ventriculography: Deferred because of chronic kidney disease  Estimated Blood Loss: minimal  Final Conclusions:  1. Acute occlusion of a left circumflex/obtuse marginal subbranch Y clearly related to coronary embolism  2. Mild to moderate diffuse coronary artery disease as detailed above.  Recommendations: The patient will be transferred to the CCU. He will be continued on  IV amiodarone until tomorrow morning when it can probably be discontinued. I think the most likely mechanism of his acute MI his coronary embolism related to atrial fibrillation. The patient has not been anticoagulated because of recurrent GI bleeding. Abrupt distal vessel occlusion is suggestive of an embolic event rather than a de novo plaque rupture. The patient had recent EGD with gastric ulcers noted with a 'clean base.' Favor re-initiation of warfarin with an unfractionated heparin bridge and close monitoring of H/H while he is  hospitalized. May be reasonable to consider LAA closure once he recovers from his MI. Would avoid ASA as we initiate warfarin in this patient with recurrent GI bleeding. Will discuss options with his primary cardiologist, Dr Gwen Pounds.  Tonny Bollman MD, [facility] 05/20/2014, 6:07 PM   Echocardiogram 05/21/2014 Study Conclusions  - Left ventricle: The cavity size was normal. There was moderate concentric hypertrophy. Systolic function was normal. The estimated ejection fraction was in the range of 55% to 60%. Although no diagnostic regional wall motion abnormality was identified, this possibility cannot be completely excluded on the basis of this study. Features are consistent with a pseudonormal left ventricular filling pattern, with concomitant abnormal relaxation and increased filling pressure (grade 2 diastolic dysfunction). - Mitral valve: Calcified annulus. - Left atrium: The atrium was moderately dilated. Anterior-posterior dimension: 51 mm.   Treatments:  See above   Discharge Exam: Blood pressure 142/64, pulse 50, temperature 98 F (36.7 C), temperature source Oral, resp. rate 18, height 5\' 11"  (1.803 m), weight 208 lb 8.9 oz (94.6 kg), SpO2 94 %.   Disposition: Final discharge disposition not confirmed      Discharge Instructions    Diet - low sodium heart healthy    Complete by:  As directed      Increase activity slowly    Complete by:  As directed             Medication List    STOP taking these medications        CARTIA XT 240 MG 24 hr capsule  Generic drug:  diltiazem     valsartan-hydrochlorothiazide 320-25 MG per tablet  Commonly known as:  DIOVAN-HCT      TAKE these medications        acetaminophen 500 MG tablet  Commonly known as:  TYLENOL  Take 1,000 mg by mouth daily as needed (pain).     alum & mag hydroxide-simeth 200-200-20 MG/5ML suspension  Commonly known as:  MAALOX/MYLANTA  Take 30 mLs by mouth 4 (four) times daily  - after meals and at bedtime.     glipiZIDE 5 MG tablet  Commonly known as:  GLUCOTROL  Take 5 mg by mouth 2 (two) times daily before a meal.     hydrALAZINE 25 MG tablet  Commonly known as:  APRESOLINE  Take 1 tablet (25 mg total) by mouth every 8 (eight) hours.     IRON PO  Take 1 tablet by mouth daily.     metoprolol tartrate 25 MG tablet  Commonly known as:  LOPRESSOR  Take 25 mg by mouth 2 (two) times daily.     nitroGLYCERIN 0.4 MG SL tablet  Commonly known as:  NITROSTAT  Place 1 tablet (0.4 mg total) under the tongue every 5 (five) minutes x 3 doses as needed for chest pain.     simvastatin 10 MG tablet  Commonly known as:  ZOCOR  Take 10 mg by mouth daily.     warfarin 5 MG tablet  Commonly known as:  COUMADIN  Take 1 tablet (5 mg total) by mouth daily.       Follow-up Information    Follow up with Lamar BlinksKOWALSKI,BRUCE J, MD On 06/14/2014.   Specialty:  Internal Medicine   Why:  10:30 AM   Contact information:   [address] [place] [address] [phone]       Follow up with Coumadin Check On 05/29/2014.   Why:  11:00 AM   Contact information:   [address] [place] [address] [phone]     Greater than 30 minutes was spent completing the patient's discharge.    SignedWilburt Finlay: [name], BRYAN, PAC 05/27/2014, 9:21 AM   Attending Note:   The patient was seen and examined.  Agree with assessment and plan as noted above.  Changes made to the above note as needed.  See my note from earlier today. He is ready to be discharged.  Will follow up with Dr. Joana ReamerKowolski n [place] .   Vesta MixerPhilip J. Nahser, Montez HagemanJr., MD, [facility] 05/27/2014, 5:13 PM [address],  [address] Office [phone] Pager [phone]

## 2014-05-27 NOTE — Plan of Care (Signed)
Problem: Consults Goal: Cardiac Cath Patient Education (See Patient Education module for education specifics.)  Outcome: Completed/Met Date Met:  05/27/14  Problem: Phase II Progression Outcomes Goal: Discharge plan in place and appropriate Outcome: Completed/Met Date Met:  05/27/14 Goal: Hemodynamically stable Outcome: Progressing Goal: Ambulates up to 600 ft. in hall x 1 Outcome: Progressing Goal: Activity appropriate for discharge plan Outcome: Adequate for Discharge

## 2014-06-06 ENCOUNTER — Encounter (HOSPITAL_COMMUNITY): Payer: Self-pay | Admitting: Cardiovascular Disease

## 2014-10-18 NOTE — Consult Note (Signed)
PATIENT NAME:  [name], [name] MR#:  [id number] DATE OF BIRTH:  04-29-1946  DATE OF CONSULTATION:  04/26/2013  REFERRING PHYSICIAN:   CONSULTING PHYSICIAN:  Payton Emerald, NP/Paul Oh, MD  PRIMARY CARE DOCTOR:  Dr. Oran Rein  ATTENDING PHYSICIAN:  Dr Margaretmary Eddy.  REASON FOR CONSULTATION:  Melena, heme-positive stool.   HISTORY OF PRESENT ILLNESS:  Mr. Burman Riis is a 69 year old, African American gentleman with significant past medical history of diabetes mellitus, hypertension, anemia, CVA, congestive heart failure as well as atrial flutter. The patient had been on warfarin. It was discontinued two months ago for dental work. In reviewing H and P on admission, it states the patient is currently taking Plavix and aspirin, which the patient, as well as family members, states that that is not accurate. He was actually on warfarin and Plavix as the time of when the warfarin was discontinued two months ago thus the Plavix was discontinued at that time and currently he is only taking aspirin 325 mg tablet once a day. The patient states he noticed one occurrence of black-colored stool yesterday morning then went about his business throughout the day and yesterday evening at church he started feeling bad, states he could not breathe. He got nauseated, diaphoretic, and his wife presented to the Emergency Room with him. Normally his bowel pattern is every day. This is the only melenatonic-like stool which he has noticed. No evidence of bright-red blood, no nausea at this time, never vomited, no fevers. The patient has had a colonoscopy done a couple of years ago by Dr. Loistine Simas, which was unremarkable. He has never had an upper endoscopy. Denies a history of ulcer disease. He is not taking any NSAIDs. There was a noted drop in hemoglobin. On 11/2012 it was 12 and on admission it was 8.8. Shortness of breath has improved. He is on no oxygen at this time. No chest pain. No dizziness, no near syncopal or  syncopal episodes.   PAST MEDICAL HISTORY: 1.  Atrial flutter, which was diagnosed 06/2011, under the care of Dr. Serafina Royals.  2.  Diabetes mellitus. 3.  Hypertension.  4.  Hyperlipidemia. 5.  A history of CVA.  6.  Congestive heart failure with an ejection fraction of 55%.   PAST SURGICAL HISTORY:  A colonoscopy.   ALLERGIES:  None.   HOME MEDICATIONS:  1.  Metformin 500 mg twice a day.  2.  Diltiazem 240 mg 1 capsule daily. 3.  Hydralazine 25 mg 1 tablet twice a day.  4.  Benicar HCT 25/40 mg once a day. 5.  Aspirin 325 mg a day.   SOCIAL HISTORY:  Resides with his wife. No tobacco. No alcohol use.   FAMILY HISTORY:  Congestive heart failure, hypertension, a sister with diabetes.   REVIEW OF SYSTEMS:  CONSTITUTIONAL:  Denies any fevers, fatigue.  EYES:  Significant for glasses. No glaucoma, no cataracts.  EARS, NOSE AND THROAT:  No epistaxis or discharge though he does state he has experienced nasal congestion/head congestion over the past week.  RESPIRATORY:  See HPI.  CARDIOVASCULAR:  A known history of atrial flutter. No chest pain. See HPI.  GASTROINTESTINAL:  See HPI.  GENITOURINARY:  No dysuria or hematuria.  ENDOCRINE:  No nocturia or thyroid problems.  HEMATOLOGY AND LYMPHATIC:   Significant for anemia. Denies easy bruising.  INTEGUMENTARY:  No lesions, no rashes.  MUSCULOSKELETAL:  No neuralgias or myalgias.  NEUROLOGIC:  A vertical history of CVA.  PSYCHIATRIC:  No depression, no anxiety.  PHYSICAL EXAMINATION:  VITAL SIGNS:  Temperature 98.1 with a pulse of 57, respirations are 18, blood pressure is 171/74 and pulse ox is 98% on room air.  GENERAL:  Well-developed, well-nourished, 69 year old African American gentleman, no acute distress noted. Pleasant.  HEENT:  Normocephalic, atraumatic. Pupils equal, reactive to light. Conjunctivae clear. Sclerae anicteric.  NECK:  Supple. Trachea midline. No lymphadenopathy or thyromegaly.  PULMONARY:  Symmetric rise  and fall of chest. Clear to auscultation throughout.  CARDIOVASCULAR:  Regular rhythm, S1, S2. No murmurs, no gallops.  ABDOMEN:  Soft, nondistended. Bowel sounds in 4 quadrants. No bruits. No masses.  RECTAL:  Deferred. Was performed in the Emergency Room and found to have guaiac-positive stool.  NEUROLOGICAL:  No gross neurological deficits noted.  EXTREMITIES:  No edema.  PSYCHIATRIC:  Alert and oriented x 4. Memory grossly intact.  MUSCULOSKELETAL:  No contractures, no clubbing.   LABORATORY, DIAGNOSTIC, AND RADIOLOGICAL DATA:  Chemistry panel on admission:  Glucose was 243, BUN was 41, creatinine was 1.84, anion gap was 5, otherwise within normal limits. EGFR was 37. CK total 160 with a CK-MB 1.2, troponin less than 0.2. Second in the series CK total of 125 with troponin less than 0.02 with a CK-MB of 1.2. CBC revealed a hemoglobin 8.8 with hematocrit of 28.3, MCV low at 67 with an MCHC of 21.4, MCHC of 31.2. Serial monitoring, hemoglobin has dropped to 7.4 with hematocrit of 23.1 this morning at 6:14. At noon today hemoglobin is 7.4. Antibody screen is negative with ABO group plus Rh type of O positive. Urinalysis essentially unremarkable. Chest, PA and lateral, reveals no evidence of acute cardiopulmonary disease. Flat and upright of abdomen revealed air seen in nondilated loops of small bowel, nonobstructive bowel gas pattern with mild to moderate stool.   IMPRESSION:  Melena, suspect upper gastrointestinal bleed, a known history of atrial flutter requiring chronic anticoagulant therapy, diabetes mellitus, hypertension, congestive heart failure.   PLAN:  The patient's presentation was discussed with Dr. Verdie Shire. The decision had already been made for the recommendation to proceed forward with an upper endoscopy today to allow direct luminal evaluation of upper gastrointestinal tract. An order has already been placed by Dr. Candace Cruise and the patient has remained on an n.p.o. status. The procedure, risks  versus benefits discussed with patient. The patient willing to proceed forward in this manner. Recommend continued protein pump inhibitor therapy. The patient currently receiving Protonix 40 mg IV every 12 hours. We will continue to monitor, recommend continued serial monitoring of hemoglobin and hematocrit and to transfuse as necessary.   These services provided by Payton Emerald, MS, APRN, [facility], FNP, under collaborative agreement with Dr. Verdie Shire.  ____________________________ Payton Emerald, NP dsh:jm D: 04/26/2013 12:52:54 ET T: 04/26/2013 13:08:38 ET JOB#: [id number]  cc: Payton Emerald, NP, <Dictator> Ginette A. Oran Rein, MD Payton Emerald MD ELECTRONICALLY SIGNED 04/27/2013 15:45

## 2014-10-18 NOTE — Consult Note (Signed)
Chief Complaint:  Subjective/Chief Complaint Feels fine. No BM or rectal bleeding since admission. Tolerated liquid diet last night. Hgb stable since yest. Apparently, had a normal colonoscopy elsewhere 3 yrs ago.   VITAL SIGNS/ANCILLARY NOTES: **Vital Signs.:   31-Oct-14 05:04  Vital Signs Type Routine  Temperature Temperature (F) 98.2  Celsius 36.7  Pulse Pulse 53  Respirations Respirations 18  Systolic BP Systolic BP 156  Diastolic BP (mmHg) Diastolic BP (mmHg) 75  Mean BP 102  Pulse Ox % Pulse Ox % 99  Pulse Ox Activity Level  At rest  Oxygen Delivery Room Air/ 21 %   Brief Assessment:  GEN no acute distress   Cardiac Regular   Respiratory clear BS   Gastrointestinal Normal   Lab Results: Routine Hem:  31-Oct-14 04:48   WBC (CBC) 5.8  RBC (CBC)  3.40  Hemoglobin (CBC)  8.0  Hematocrit (CBC)  24.4  Platelet Count (CBC) 202  MCV  72  MCH  23.6  MCHC 32.9  RDW  16.5  Neutrophil % 55.4  Lymphocyte % 29.3  Monocyte % 8.1  Eosinophil % 6.1  Basophil % 1.1  Neutrophil # 3.2  Lymphocyte # 1.7  Monocyte # 0.5  Eosinophil # 0.4  Basophil # 0.1 (Result(s) reported on 27 Apr 2013 at 05:19AM.)   Radiology Results: XRay:    29-Oct-14 22:28, Chest PA and Lateral  Chest PA and Lateral   REASON FOR EXAM:    SOB  COMMENTS:       PROCEDURE: DXR - DXR CHEST PA (OR AP) AND LATERAL  - Apr 25 2013 10:28PM     RESULT: The lungs are clear. The cardiac silhouette and visualized bony   skeleton are unremarkable.    IMPRESSION:      1. Chest radiograph without evidence of acute cardiopulmonary disease.   2. Comparison 06/15/2012        Verified By: Jani FilesHECTOR W. COOPER, M.D., MD    30-Oct-14 00:20, Abdomen Flat and Erect  Abdomen Flat and Erect   REASON FOR EXAM:    abd pain  COMMENTS:       PROCEDURE: DXR - DXR ABDOMEN 2 V FLAT AND ERECT  - Apr 26 2013 12:20AM     RESULT:     Findings: Air is seen within nondilated loops of large and small bowel.   The  visualized bony skeleton is unremarkable. A mild to moderate amount   of stool is appreciated.    IMPRESSION:  Nonobstructive bowel gas pattern with a mild to moderate   amount of stool.    Thank you for this opportunity to contribute to the care of your patient.         Verified By: Jani FilesHECTOR W. COOPER, M.D., MD   Assessment/Plan:  Assessment/Plan:  Assessment Gastritis. No bleeding.   Plan Reg diet ordered. Continue daily PPI. If no signs of bleeding, then ok for discharge on daily PPI and Fe supplements. Will sign off. Call GI on call if there are GI issues. Thanks.   Electronic Signatures: Lutricia Feilh, Mikaela Hilgeman (MD)  (Signed 31-Oct-14 09:11)  Authored: Chief Complaint, VITAL SIGNS/ANCILLARY NOTES, Brief Assessment, Lab Results, Radiology Results, Assessment/Plan   Last Updated: 31-Oct-14 09:11 by Lutricia Feilh, Jahmad Petrich (MD)

## 2014-10-18 NOTE — H&P (Signed)
PATIENT NAME:  [name], [name] MR#:  [id number] DATE OF BIRTH:  04/06/1946  DATE OF ADMISSION:  04/26/2013  PRIMARY CARE PHYSICIAN: Dr. Andrey SpearmanArchinal.   CHIEF COMPLAINT: Black tarry stool.   HISTORY OF PRESENT ILLNESS: The patient is a 69 year old African-American male with a past medical history of diabetes mellitus, hypertension, hyperlipidemia, per old history of stroke, congestive heart failure, which is well compensated and atrial flutter, used to take Coumadin which was discontinued 2 months ago regarding his dental work. He is presenting to the ER with a chief complaint of black tarry stool. The patient is reporting that though he stopped taking Coumadin, he is currently taking aspirin and Plavix in view of the history of atrial flutter and fibrillation. The patient denies any abdominal pain, denies nausea or vomiting.  He suddenly started having black tarry stools since yesterday morning which was liquidy. No similar complaints in the past. In fact, he had a colonoscopy done approximately 2 years ago by Dr. Marva PandaSkulskie, as reported by the patient, which was normal. In the ER, the patient had three-view abdominal series, which has revealed moderate stool. A stool for Hemoccult was strongly positive. The patient's hemoglobin dropped from 12 in June of 2014 to 8.8 currently. The patient denies any chest pain, shortness of breath or weight gain. Denies any dizziness or loss of consciousness. He has received 1 liter of fluid bolus. EKG has revealed T wave inversions, but first set of cardiac enzymes are negative. Typing and screening is ordered, which is pending at this time. No family members are at bedside. The patient denies any other complaints.   PAST MEDICAL HISTORY:  1.  Atrial flutter which was diagnosed in January of 2013 evaluated by Dr. Lovina ReachFat, an ejection fraction was at 55% at that time and the patient has been patient was started on Coumadin since then which was discontinued two months ago  regarding his dental work and currently taking aspirin and Plavix.  2.  Diabetes mellitus, hypertension, hyperlipidemia, history of stroke with no deficits, congestive heart failure with previous ejection fraction 55%.   PAST SURGICAL HISTORY: Colonoscopy, which was normal.  ALLERGIES:  No known drug allergies.   PSYCHOSOCIAL HISTORY: Lives at home. Lives with wife. Denies any history of smoking, alcohol or illicit drug usage.   FAMILY HISTORY: Mother had congestive heart failure and hypertension, a sister has diabetes mellitus.   HOME MEDICATIONS:  1.  Metformin 500 mg 2 times a day. 2.  Diltiazem 240 mg 1 capsule p.o. once daily. 3.  Hydralazine 25 mg p.o. 2 times a day. 4.  Benicar HCT 25/40 once daily. 4.  Aspirin 325 mg once a day.   REVIEW OF SYSTEMS: CONSTITUTIONAL: Denies any fever or fatigue.  EYES: Denies blurry vision, glaucoma.  EARS, NOSE, THROAT: No epistaxis, discharge.  RESPIRATION:  Denies cough, COPD.   CARDIOVASCULAR: Denies chest pain, palpitations or syncope.  GASTROINTESTINAL: Denies nausea, vomiting, but complaining of semisolid stool which is black and tarry. Denies any hematemesis. No ulcers. No GERD. GENITOURINARY: No dysuria or hematuria. Denies any hernia.  ENDOCRINE: Denies polyuria, nocturia or thyroid problems. Has history of diabetes mellitus. HEMATOLOGIC/LYMPHATIC: Denies any previous history of anemia or easy bruising or bleeding.  INTEGUMENTARY: No acne, rash, lesions.  MUSCULOSKELETAL: No joint pain in neck and back. Denies any gout.  NEUROLOGIC: No vertigo, ataxia, has old history of stroke. PSYCHIATRIC: No ADD or OCD.   PHYSICAL EXAMINATION: VITAL SIGNS: Temperature 98.1, pulse 68, respirations 20, blood pressure 164/74, pulse  ox 100%.  GENERAL APPEARANCE: No in any acute distress. Moderately built and nourished.  HEENT: Normocephalic, atraumatic. Pupils are equally reacting to light and accommodation. No scleral icterus. No conjunctival  injection. No sinus tenderness. No postnasal drip. No sinus tenderness.  NECK: Supple. No JVD. No thyromegaly. Range of motion is intact.   LUNGS: Clear to auscultation bilaterally. No accessory muscle usage. No anterior chest wall tenderness on palpation.  CARDIAC: S1, S2 normal. Regular rate and rhythm. No peripheral edema.  GASTROINTESTINAL: Soft. Bowel sounds are positive in all four quadrants. Nontender, nondistended. No hepatosplenomegaly. No masses felt.  NEUROLOGIC: Awake, alert, oriented x 3. Cranial nerves II through XII are grossly intact. Motor and sensory intact. Reflexes are 2+.  EXTREMITIES: No edema. No cyanosis. No clubbing.  PSYCHIATRIC: Normal mood and affect.  MUSCULOSKELETAL: No joint effusion, tenderness or erythema.  VASCULAR: Dorsalis pedis and posterior tibialis pulses are normal.  LABS AND IMAGING STUDIES:  CK total 160, CPK-MB 1.2, troponin less than 0.02. WBC 10.3, hemoglobin 8.8, hematocrit 28.3, platelets 300.  Urinalysis yellow in color, clear in appearance, glucose 50 mg/dL, nitrites and leukocyte esterase are negative. Glucose 243, BUN 41, creatinine 1.84, sodium 138, potassium 4.3, chloride 107, CO2 26, anion gap 5, serum osmolality 294, calcium 9.0. Accu-Cheks 242. A 12-lead EKG has revealed sinus rhythm with T wave inversions. No acute ST-T wave elevations.  Abdominal series moderate stool.   ASSESSMENT AND PLAN: A 69 year old African-American male presenting to the ER with a chief complaint of 1 day history of black tarry stool and hemoglobin dropped from 12 to 8.8. Will be admitted with the following assessment and plan;  1.  Upper gastrointestinal bleed with black tarry stool. We will keep him n.p.o. with ice chips. We will provide him IV fluids. The patient is n.p.o. We will give him IV Protonix q.12 hours. Will monitor hemoglobin and hematocrit q.8 hours. Will check stool for Hemoccult on all stool sample.  2.  GI consult is placed and discussed with Dr.  Mechele Collin.  Dr. Mechele Collin is not on call in the morning and he has asked the rounding physician to notify GI on call in the a.m. 3. EKG changes with T wave inversions, but no chest pain, probably demand ischemia. Will cycle cardiac biomarkers and the patient will be on telemetry.  4. Acute kidney injury. We will provide him gentle hydration with IV fluids. Monitor for symptoms and signs of fluid overload in view of congestive heart failure.  5.  Diabetes mellitus: The patient being n.p.o. he will be on sliding scale insulin.  6.  Hypertension. We will provide him IV Cardizem as-needed basis for elevated blood pressure or heart rate.  7.  We will provide him GI prophylaxis.  8.  Deep vein thrombosis prophylaxis with sequential compression devices.   Diagnosis and plan of care was discussed in detail with the patient. He is aware of the plan.  CODE STATUS: He is full code. Wife is the medical power of attorney.   Total time spent on admission is 45 minutes.    ____________________________ Ramonita Lab, MD ag:NTS D: 04/26/2013 03:17:20 ET T: 04/26/2013 03:55:08 ET JOB#: [id number]  cc: Ramonita Lab, MD, <Dictator> Ginette A. Andrey Spearman, MD Ramonita Lab MD ELECTRONICALLY SIGNED 05/03/2013 8:19

## 2014-10-18 NOTE — Consult Note (Signed)
Pt seen and examined. Please see Dawn Harrison's notes. EGD showed erosive gastritis. No active bleeding. Hopefully, bleeding will stop off ASA/plavix. Continue daily PPI. Clear liquid diet ordered.  Electronic Signatures: Lutricia Feilh, Anasha Perfecto (MD)  (Signed on 30-Oct-14 14:09)  Authored  Last Updated: 30-Oct-14 14:09 by Lutricia Feilh, Annalea Alguire (MD)

## 2014-10-18 NOTE — H&P (Signed)
PATIENT NAME:  [name], [name] MR#:  [id number] DATE OF BIRTH:  Jun 04, 1946  DATE OF ADMISSION:  05/27/2013  REFERRING PHYSICIAN: Jene Every, MD  PRIMARY CARE PHYSICIAN: Noralee Stain, MD   PRIMARY CARDIOLOGIST: Arnoldo Hooker, MD   HISTORY OF PRESENT ILLNESS:  This is a very nice 69 year old gentleman who was recently admitted over here on 04/26/2013 with history of black, tarry stools. The patient underwent and upper endoscopy that showed normal esophagus, gastritis and otherwise normal he was told to stop his aspirin for several days and then to start on 81 mg instead of 325. The patient apparently has been off Coumadin for over 4 or 5 weeks due to ongoing dental procedures.  The patient is at high risk of stroke as he already had one. He has congestive heart failure and diabetes so he has a high [place] score, but he has not been put back on Coumadin. At this moment, he comes with a history of having nausea and sweating this morning. He felt really dizzy and lightheaded and he knew that his heart was doing something weird. He did not feel any palpitations. He did not have any significant chest pain or shortness of breath, but he knew that his heart was going fast because that is a symptomatology that he always has whenever this is the case. If the patient came and he was found to be in atrial flutter with a heart rate of 152 for which he was giving a bolus of IV Cardizem. After the bolus, his heart rate came down to the 90s and after he was going to be discharged, it started to pick up. Apparently he drinks one cup of coffee, which I do not think is the reason why it went up so quickly,  back 156. I think he needs possibly adjustment of his medications.  We are going to admit him to the Critical Care Unit on the Cardizem drip. He definitely needs to be anticoagulated, especially in these occasions when he is having significant RVR and then we need to figure out a better planned for the patient to  go home and be put back on anticoagulation on and off while he is in between procedures.  His last procedure was a month ago and the patient has not been back on Coumadin. The patient is right now having a heart rate in the 130s. The patient is admitted and we will consult Dr. Gwen Pounds in the morning.   REVIEW OF SYSTEMS:  CONSTITUTIONAL: No fever, fatigue, weight loss or weight gain.  EYES: No blurry vision, double vision.  EARS, NOSE, THROAT: No tinnitus or difficulty swallowing.   RESPIRATORY: No cough, wheezing, painful respirations or chronic obstructive pulmonary disease.  CARDIOVASCULAR: Denies any chest pain, orthopnea, edema. Positive arrhythmia. No palpitations, no syncope. No edema.  GASTROINTESTINAL: No nausea, vomiting, abdominal pain, constipation or diarrhea. No recent bleeding, but the patient was admitted on 04/26/2013, with black, tarry stools and was diagnosed with acute gastritis, likely secondary to aspirin.  GENITOURINARY: No dysuria, hematuria, changes in frequency. No prostatitis.  ENDOCRINE: No polyuria, polydipsia, polyphagia, cold or heat intolerance.  HEMATOLOGIC AND LYMPHATIC: No anemia, easy bruising or bleeding. No swollen glands.  SKIN: No rashes or petechiae.  MUSCULOSKELETAL: No neck pain, back pain or joint swelling.  NEUROLOGIC: No numbness, tingling. No vertigo, no recent neurologic changes. The patient had a stroke about five years ago.  PSYCHIATRIC: No anxiety, insomnia or nervousness.   PAST MEDICAL HISTORY: 1.  Atrial flutter.  2.  Congestive heart failure, diastolic compensated with ejection fraction of 55%.  3.  Non-insulin-dependent diabetes.  4.  Hypertension.  5.  Hyperlipidemia.  6.  Stroke five years ago.  7.  Gastritis secondary to aspirin.  8.  GERD.   PAST SURGICAL HISTORY: Dental procedures ongoing and colonoscopy in the past which was normal. EGD showed gastritis.   ALLERGIES: The patient does not have any known drug allergies.    SOCIAL HISTORY: The patient lives with his wife. He denies any alcohol, tobacco or drug use.   FAMILY HISTORY: Congestive heart failure in his mom who also has hypertension and diabetes in his sisters.   CURRENT MEDICATIONS: Off Coumadin, glipizide 5 mg twice daily, diltiazem 240 mg once daily, hydralazine 25 mg twice daily, Benicar/hydrochlorothiazide 25/40 mg once daily, aspirin 81 mg daily.   PHYSICAL EXAMINATION: VITAL SIGNS: Blood pressure 137/95, heart rate 156, respirations 18, oxygen saturation 99% on room air, temperature 98.2.  GENERAL: The patient is alert, oriented x 3, in no acute distress. No respiratory distress.  HEENT:  His pupils are equal and reactive. Extraocular movements are intact. Anicteric sclerae. Pink conjunctivae. No oral lesions. No oropharyngeal exudates.  NECK: Supple. No JVD. No thyromegaly. No adenopathy. No carotid bruits.  CARDIOVASCULAR: Irregularly irregular, tachycardic in the 130s. No displacement of PMI. No murmurs, rubs or gallops are appreciated due to heart rate.  LUNGS: Clear without any wheezing or crepitus. No use of accessory muscles. No dullness to percussion.  ABDOMEN: Soft, nontender, nondistended. No hepatosplenomegaly. No masses. Bowel sounds are positive.  GENITAL: Deferred.  EXTREMITIES: No edema, cyanosis or clubbing. Pulses +2. Capillary refill less than three distally.  NEUROLOGIC: Cranial nerves II through XII intact. Strength is 5/5 in all four extremities. Deep tendon reflexes +2.  PSYCHIATRIC: Negative for anxiety, agitation. The patient is alert, oriented x 3.  SKIN: No rashes or petechiae.  LYMPHATIC: Negative for lymphadenopathy in neck or supraclavicular areas.  MUSCULOSKELETAL: No significant joint effusions or joint swelling.   LABORATORY, DIAGNOSTIC AND RADIOLOGIC DATA:  White count 7.2. Hemoglobin is 10.4. Previous hemoglobin was 8.0 after possible gastrointestinal bleeding and his platelet count is 403. His INR was 1,  troponin was negative. Total protein is slightly elevated at 8.6, glucose 169, BUN 16, creatinine 1.84, which is exactly the same as whenever the patient came back in October. Previous creatinine done in June was 1.08, possibly due to a new baseline. His GFR is around 43, which makes him a stage III chronic kidney disease, likely.    EKG: Atrial flutter with RVR.   Chest x-ray:  Mild enlarged cardiomegaly. There is some crowding of the pulmonary vasculature, but no congestive heart failure.   ASSESSMENT AND PLAN: A 69 year old gentleman with history of congestive heart failure, diabetes, hypertension, previous stroke, atrial flutter, comes with atrial flutter with rapid ventricular response.    1.  Atrial flutter with rapid ventricular response. The patient is not on anticoagulation due to some dental work-up, although he has been off Coumadin for over a month, and I do not know if the patient had a miscommunication with his doctor cardiologist that he was not put back on Coumadin but he really needs to be on it. His last dental procedure was over a month ago and his next procedure may happen next week, but in the meantime there has to be a plan to keep him on some anticoagulation. He had a history of gastrointestinal bleeding, which was aspirin likely due to gastritis  and no ulcers. No other source of bleeding. His hemoglobin actually is back to normal for which we are going to anticoagulate him with Lovenox. Once he is discharged, if he is going to have dental procedure, he can be off of it, but would recommend to restart back as soon as possible. We are going to get Dr. Gwen PoundsKowalski on the case. As far as his rapid ventricular response, the reason of this is not clear, but possibly the patient needs to be on higher dose of diltiazem or on a beta blocker. Since the patient has congestive heart failure,  I am going to add a beta blocker and keep him on, that might help preventing this is spikes on his heart  rate. Again, Dr. Gwen PoundsKowalski will be on the case tomorrow to decide if he stays on a beta blocker or if he goes on a higher dose of Cardizem.  2.  Diabetes. The patient has uncontrolled diabetes. Check a hemoglobin A1c, continue glipizide. The patient was on metformin was taken off of it due to chronic kidney disease.  3.  Far as his hypertension, continue treatment with IV Cardizem and now, we are going to add a beta blocker,  monitor closely for hypotension.  4.  As far as his hyperlipidemia, the patient is not taking any medications for it. We are going to repeat lipid profile.  5.  History of gastrointestinal bleeding. Since the patient is going to be on anticoagulation, we  are going to put him on twice a day dose of proton pump inhibitor. Avoid high dose aspirin, but the patient is able to take 81 mg daily.  6.  As far as his previous stroke. Continue aspirin for now, but the patient should be sufficiently taken care of with Coumadin after we start Coumadin.  7.  Other medical problems are stable.   TIME SPENT: I spent about 50 minutes with this patient in critical care time, as he is significant risk for stroke, significant risk of cardiovascular collapse due to his heart rate going in the 150 sustained.    ____________________________ Felipa Furnaceoberto Sanchez Gutierrez, MD rsg:cc D: 05/27/2013 20:45:29 ET T: 05/27/2013 21:18:21 ET JOB#: [id number]  cc: Felipa Furnaceoberto Sanchez Gutierrez, MD, <Dictator> Ilhan Debenedetto Juanda ChanceSANCHEZ GUTIERRE MD ELECTRONICALLY SIGNED 06/07/2013 18:01

## 2014-10-18 NOTE — Discharge Summary (Signed)
PATIENT NAME:  [name], [name] MR#:  [id number] DATE OF BIRTH:  1946-01-15  DATE OF ADMISSION:  04/26/2013 DATE OF DISCHARGE:  04/27/2013  PRESENTING COMPLAINT: Melena.   DISCHARGE DIAGNOSES:  1. Melena secondary to acute gastritis.  2. Anemia, acute on chronic, due to gastritis.  3. Chronic atrial fibrillation.  4. Hypertension.  5. Type 2 diabetes.   PROCEDURES: Esophagogastroduodenoscopy showed normal esophagus, gastritis, normal exam of duodenum.   CONSULTATION: GI, Ezzard Standingaul Y. Bluford Kaufmannh, MD  MEDICATIONS:  1. Diltiazem 240 mg p.o. daily.  2. Benicar/hydrochlorothiazide 40/25 one tablet p.o. daily.  3. Hydralazine 25 mg b.i.d.  4. Ferrous sulfate 325 mg p.o. b.i.d.  5. Protonix 40 mg daily. 6. Multivitamin 1 p.o. daily.  7. Glipizide 5 mg b.i.d.   The patient advised to stop taking:  1. Metformin due to elevated creatinine.  2. Aspirin due to GI bleed. The patient recommended to use enteric-coated baby aspirin 81 mg after 2 to 3 weeks.   FOLLOWUP: With primary care physician, [name], MD, in 1 to 2 weeks.   LABORATORY DATA: Hemoglobin at discharge is 8.0, hematocrit is 24, white count is 5.8.  Stool culture: No pathogens noted.  Hemoccult: Positive.  Cardiac enzymes: Negative.   HISTORY AND HOSPITAL COURSE: [name] is a very pleasant 69 year old African-American gentleman with past medical history of chronic atrial fibrillation, was on Coumadin in the past, discontinued, and kept on aspirin by Dr. Gwen PoundsKowalski given him being in normal sinus rhythm, along with history of hypertension and diabetes, who comes to the Emergency Room with history of black stools. He was found to have hemoglobin drop from 12 in June to 8.8. He was admitted with: 1. Melena, which is likely due to upper gastrointestinal bleed with black tarry stools. The patient was kept n.p.o. Hemoglobin went down from 12 in June 2014 to 8.8 at admission, down to 7.4. He received 1 unit blood transfusion in the  setting of cardiac history, and hemoglobin came up to eight. Dr. Bluford Kaufmannh saw the patient and recommended EGD, which showed gastritis. The patient is recommended not to take his aspirin for the next 2 to 3 weeks, continue Protonix and then resume enteric-coated aspirin. He tolerated the procedure well.  2. History of chronic atrial fibrillation, follows with Dr. Gwen PoundsKowalski. The patient was taken off Coumadin as he remained in sinus rhythm as outpatient. Currently, only on aspirin. The patient remained in normal sinus rhythm with occasional PVCs on telemetry.  3. Type 2 diabetes. Discontinued metformin due to elevated creatinine. Started on p.o. glipizide. 4. Hypertension. The patient is to resume back on Cardizem, hydrochlorothiazide and Benicar along with hydralazine.  5. Hospital stay otherwise remained stable.   CODE STATUS: The patient remained a full code.   TIME SPENT: 40 minutes.    ____________________________ [name], MD sap:lb D: 04/28/2013 07:13:12 ET T: 04/28/2013 07:29:16 ET JOB#: [id number]  cc: Merlene Dante A. Allena KatzPatel, MD, <Dictator> Ginette A. Andrey SpearmanArchinal, MD [name], MD  Willow OraSONA A Khayman Kirsch MD ELECTRONICALLY SIGNED 05/03/2013 13:27

## 2014-10-18 NOTE — Consult Note (Signed)
Brief Consult Note: Diagnosis: Melena, suspect Upper GI bleed.  History of atrial flutter requiring chronic anticoagulant therapy.  Diabetes Mellitus, Hypertension and History of CHF.   Consult note dictated.   Discussed with Attending MD.   Comments: Patient's presentation has been discussed with Dr. Lutricia FeilPaul Oh.  Recommendation to proceed with EGD today to allow direct luminal evaluation of upper GI tract to assess for source of blood loss.  Order has already been placed by Dr. Bluford Kaufmannh.  Procedure, risk versus benefits discussed with patient.  Continue with PPI therapy.  Recommend continued serial monitoring of hemoglobin and transfuse as necessary.  Electronic Signatures: Rodman KeyHarrison, Terri Malerba S (NP)  (Signed 30-Oct-14 12:55)  Authored: Brief Consult Note   Last Updated: 30-Oct-14 12:55 by Rodman KeyHarrison, Kinzleigh Kandler S (NP)

## 2014-10-18 NOTE — Discharge Summary (Signed)
PATIENT NAME:  [name], Jay H MR#:  [id number] DATE OF BIRTH:  [birth year]  DATE OF ADMISSION:  05/27/2013 DATE OF DISCHARGE:  05/28/2013  ADMITTING DIAGNOSIS: Atrial fibrillation with rapid ventricular response.    DISCHARGE DIAGNOSES:  1.  Atrial fibrillation, rapid ventricular response, reconverted into sinus rhythm, now on Cardizem.  2.  Renal insufficiency, suspected mild dehydration.  3.  Acute bronchitis.  4.  History of hypertension.  5.  Atrial flutter on chronic anticoagulation with Coumadin therapy, now on hold due to pending dental procedures.  6.  Diastolic congestive heart failure, chronic.  7.  Diabetes mellitus, with hemoglobin A1c 7.7.  8.  Hypertension.  9.  Hyperlipidemia.  10.  Gastroesophageal reflux disease.  11.  History of gastritis.   The patient was advised to drink plenty of fluids when he comes home.  MEDICATIONS:  1.  Iron sulfate 325 mg one tablet twice daily with meals.  2.  Pantoprazole 40 mg p.o. daily.  3.  Multivitamins once daily.  4.  Glipizide 5 mg p.o. twice daily.  5.  Aspirin 81 mg p.o. daily.  6.  Olmesartan 40 mg p.o. daily.  7.  Metoprolol tartrate 25 mg p.o. twice daily.  8.  Senna 1 tablet once daily at bedtime as needed.  9.  Docusate sodium 100 mg p.o. twice daily as needed.  10.  Azithromycin 250 mg p.o. daily for 4 days to be started on the 2nd of December, 2014.   The patient was advised not to take hydralazine at all and hold Benicar HCT unless recommended by primary care physician and cardiology.   HOME OXYGEN: None.   DIET: Two grams salt, low fat, low cholesterol, carbohydrate controlled diet, regular consistency.   ACTIVITY LIMITATIONS: As tolerated.   FOLLOWUP APPOINTMENT: With Dr. Gwen PoundsKowalski in 1 week after discharge. Also the patient was advised to follow up with primary care physician, Dr. Andrey SpearmanArchinal, in 2 days after discharge.   CONSULTANTS: Dr. Juliann Paresallwood.   RADIOLOGIC STUDIES: Chest x-ray, portable single view,  the 30th of November 2014, revealed a mildly enlarged pericardial silhouette, thoracic spondylosis. Low lung volumes are present causing crowding of pulmonary vasculature according to radiologist.   HISTORY OF PRESENT ILLNESS: The patient is a 69 year old male with past medical history significant for history of atrial flutter in the past who was in sinus rhythm for a while, who presented to the hospital with complaints of not feeling well. Apparently, became dizzy, lightheaded, and he was concerned about his heart; however, he did not feel any palpitations.   On arrival to the Emergency Room, he was noted to be in A. flutter, atrial fib. Rate was 150. He was given a bolus of IV Cardizem. After bolus, his heart rate came down to 90s; however, it picked up again and it went up to 156. So he was admitted to critical care unit for Cardizem drip and converted into sinus rhythm. On arrival to the Emergency Room, the patient's blood pressure was 137/95. Heart rate was 156. Respirations were 18. Oxygen saturations were 99% on room air. Temperature was 98.2.   PHYSICAL EXAM: Unremarkable.   LABORATORIES: The patient's lab data done on arrival showed elevation of creatinine to 1.84, glucose 169; otherwise, BMP was unremarkable. The patient's liver enzymes revealed elevation of total protein of 8.6, otherwise unremarkable. Troponin, first set as well as subsequent two more sets, were within normal limits. TSH was normal at 0.789. CBC: White blood cell count was 7.2, hemoglobin was 10.4, platelet  count was 403. MCV was low at 67. Coagulation panel was unremarkable.   EKG showed atrial flutter, Afib. Initial atrial flutter was 2:1 AV conduction. The patient also had some T inversions when heart rate was high, in 150s, concerning for possible subendocardial injury.   The patient was admitted to the hospital for further evaluation. He was started on metoprolol and he, as mentioned above, converted to sinus rhythm. It  was felt that the patient's atrial fibrillation was possibly due to mild dehydration and suspected acute bronchitis, which the patient has been experiencing for the past few days.   Since he converted into sinus rhythm, he was advised to continue metoprolol tartrate 25 mg p.o. daily dose. He was also advised not to take hydralazine at all due to propensity of hydralazine to induce arrhythmias as well as tachycardia, and to hold his hydrochlorothiazide due to mild dehydration.   DISCHARGE VITAL SIGNS: Stable with temperature of 98.3. Pulse was in the 50s to 60s in sinus rhythm. Respiratory rate was 14 to 18. Blood pressure 139 over 70s. Oxygen saturation was 98% on room air at rest.   In regards to dehydration, the patient was given IV fluids and his kidney function improved but did not return yet back to normal. Creatinine on the day of discharge, 1st of December 2014, was 1.49. The patient was advised to hold hydrochlorothiazide and drink plenty of fluids and follow up with his primary care physician for further testing of his kidney function.   In regards to hypertension, the patient was advised to stop his hydrochlorothiazide as mentioned above. He is to continue, however, olmesartan, and beta blocker was prescribed as metoprolol. The patient was not to take hydralazine anymore unless recommended by primary care physician.   For acute bronchitis, the patient was advised to start erythromycin therapy. He was given 500 mg one time daily dose on the 1st of December 2014. He is to continue 250 mg p.o. daily dose for 4 more days, starting on the 2nd of December 2014.   For his chronic medical problems, hyperlipidemia, gastroesophageal disease. We did not change any medications. For diabetes mellitus, we checked his hemoglobin A1c. It was found to be 7.7. The patient was advised to continue diabetic diet as well as glipizide.   DISCHARGE CONDITION: Stable condition with the above-mentioned medications and  follow-up.   TIME SPENT: 40 minutes.   ____________________________ Katharina Caper, MD rv:np D: 05/28/2013 15:38:33 ET T: 05/28/2013 21:17:41 ET JOB#: [id number]  cc: Katharina Caper, MD, <Dictator> Ginette A. Andrey Spearman, MD Lamar Blinks, MD Katharina Caper MD ELECTRONICALLY SIGNED 06/06/2013 15:32

## 2014-10-19 NOTE — H&P (Signed)
PATIENT NAME:  [name], [name] MR#:  [id number] DATE OF BIRTH:  [birth year]  DATE OF ADMISSION:  04/27/2014  PRIMARY CARE PHYSICIAN: Dr. Neville Route   REFERRING  EMERGENCY ROOM PHYSICIAN: Gladstone Pih, MD  CHIEF COMPLAINT: Rectal bleeding today.  HISTORY OF PRESENT ILLNESS: A 69 year old African American male with a history of hypertension, diabetes and AFib on Coumadin, presented to the ED with rectal bleeding today. The patient is alert, awake, oriented, in no acute distress. The patient said he had 1 episode of bloody stool today, which is fresh. He does not know how much it is. He came to the ED for further evaluation. The patient also complains of dizziness and weakness, but denies any chest pain or palpitations. He denies any hematuria, easy bruising or bleeding. The patient has AFib. He has been on Coumadin for 2 years and has never had a bleeding issue before. In addition, the patient is taking aspirin 81 mg. He denies any NSAID intake recently.   PAST MEDICAL HISTORY: AFib, diastolic congestive heart failure with ejection fraction of 55%, diabetes, hypertension, hyperlipidemia, CVA, gastritis due to aspirin and GERD.   SOCIAL HISTORY: No smoking or drinking or illicit drugs.   FAMILY HISTORY: Congestive heart failure, hypertension, diabetes.   ALLERGIES: None.   HOME MEDICATIONS: The medication reconciliation list is not done yet; we will update.   REVIEW OF SYSTEMS:  CONSTITUTIONAL: The patient denies any fever or chills, No headache, but has dizziness and generalized weakness.  EYES: No double vision or blurred vision.  EAR, NOSE, THROAT: No postnasal drip, slurred speech or dysphagia.  CARDIOVASCULAR: No chest pain, palpitation, orthopnea, nocturnal dyspnea. No leg edema.  PULMONARY: No cough, sputum, shortness of breath, or hemoptysis.  GASTROINTESTINAL: No abdominal pain, nausea, vomiting, diarrhea. No melena, but has bloody stool.  GENITOURINARY: No dysuria, hematuria, or  incontinence.  SKIN: No rash or jaundice.  NEUROLOGY: No syncope, loss of consciousness, or seizure.  ENDOCRINE: No polyuria or polydipsia, heat or cold tolerance.  HEMATOLOGY: No easy bruising or bleeding, but has rectal bleeding.   PHYSICAL EXAMINATION:  VITAL SIGNS: Temperature 97.3, blood pressure 125/65, pulse 66, oxygen saturation 100% on room air.  GENERAL: The patient is alert, awake, oriented, in no acute distress.  HEENT: Pupils round, equal and reactive to light and accommodation. Moist oral mucosa. Clear oropharynx.  NECK: Supple. No JVD or carotid bruit. No lymphadenopathy. No thyromegaly.  CARDIOVASCULAR: S1 and S2. Regular rate and rhythm. No murmurs or gallops.  PULMONARY: Bilateral air entry. No wheezing or rales. No use of accessory muscles to breathe.  ABDOMEN: Soft. No distention or tenderness. No organomegaly. Bowel sounds present.  EXTREMITIES: No edema, clubbing or cyanosis. No calf tenderness. Bilateral pedal pulses present.  SKIN: No rash or jaundice.  NEUROLOGIC: A and O x 3. No focal deficit. Power 5/5. Sensation intact.   LABORATORY DATA: WBC 3.9; hemoglobin 7.78, it was 8.8 last December; platelets 202,000. Glucose 286, BUN 51, creatinine 1.72, sodium 141, potassium 4.3, chloride 108, bicarbonate 26. INR 5.5. Troponin less than 0.02. EKG shows normal sinus rhythm at 69 beats per minute.   IMPRESSIONS:  1.  Acute gastrointestinal bleeding, possibly due to coagulopathy.  2.  Coagulopathy.  3.  Anemia.  4.  Chronic kidney disease.  5.  Hypertension.  6.  Diabetes.  7.  History of atrial fibrillation, and now in normal sinus rhythm.  8.  History of chronic diastolic congestive heart failure, stable.  9.  Hyperlipidemia.  10.  History  of cerebrovascular accident.   PLAN OF TREATMENT:  1.  The patient will be admitted to medical floor.  2.  We will keep the patient on n.p.o., except for medications.  3.  Give IV fluid support.  4.  The patient will be  treated with FFP 1 units and Vitamin K 10 mg.  5.  We will start Protonix IV 40 mg once, and then started Protonix drip. 6.   follow up hemoglobin q. 6 hours.  7.  I discussed with Dr. Mechele CollinElliott. He will see the patient later. 8.  For atrial fibrillation, we will hold Coumadin and aspirin, and continue Lopressor.  9.  For diabetes, we will hold glipizide and start sliding scale.   I discussed the patient's condition and plan of treatment with the patient.   CODE STATUS: The patient wants Full Code.   Discussed with Dr. Lynnae Prudeobert Elliott.   TIME SPENT: About 58 minutes.    ____________________________ Shaune PollackQing Sanaz Scarlett, MD qc:MT D: 04/27/2014 14:26:22 ET T: 04/27/2014 14:46:11 ET JOB#: [id number]  cc: Shaune PollackQing Morrigan Wickens, MD, <Dictator> Shaune PollackQING Basilia Stuckert MD ELECTRONICALLY SIGNED 04/27/2014 17:58

## 2014-10-19 NOTE — Consult Note (Signed)
Due to heart failure I would like to give him a unit of blood after his third unit of FFP and recheck labs after the blood runs in.  No sign of failure at this time.  He had and EGD in 2014 showing gastritis.  He has been taking at least 2 extra asprin full strength daily for back pain and this may be the cause of the bleeding.  Will hold EGD for now since it likely is gastritis, possibly ulcer, given elevated BUN likely in upper gi tract.  Electronic Signatures: Scot JunElliott, Robert T (MD)  (Signed on 31-Oct-15 17:54)  Authored  Last Updated: 31-Oct-15 17:54 by Scot JunElliott, Robert T (MD)

## 2014-10-19 NOTE — Consult Note (Signed)
Pt had superficial ulcers, erosions, gastritis all in antrum and one spot in proximal lesser curve. Definitely area of recent bleeding. Would treat another day or two in hospital until hgb stable.  Add in carafate tid.  Repeat cbc tomorrow.  Electronic Signatures: Scot JunElliott, Jada Kuhnert T (MD)  (Signed on 02-Nov-15 17:51)  Authored  Last Updated: 02-Nov-15 17:51 by Scot JunElliott, Virna Livengood T (MD)

## 2014-10-19 NOTE — Consult Note (Signed)
PATIENT NAME:  [name], [name] MR#:  [id number] DATE OF BIRTH:  03-Dec-1945  CARDIOLOGY CONSULTATION  DATE OF CONSULTATION:  05/28/2013  REFERRING PHYSICIAN:  Dr. Cyril LoosenKinner, Emergency Room CONSULTING PHYSICIAN:  Ciarra Braddy D. Juliann Paresallwood, MD  The patient sees Dr. Andrey SpearmanArchinal, cardiology sees Dr. Gwen PoundsKowalski.   INDICATION:  Palpitations, tachycardia.   HISTORY OF PRESENT ILLNESS: The patient is a 69 year old African American male who states he was at church, began to feel funny and poorly, had noticed that he had significant palpitations and tachycardia, began to break out in a sweat and progressive weakness. The patient had been treated recently about 2 weeks ago for gastroenteritis with GI bleeding, presumably from the aspirin. He has been off of anticoagulation for his atrial fibrillation because of it. He has been off of his Coumadin for some time but still had significant bleeding. The patient felt dizzy, weak and lightheaded, so he was brought to the Emergency Room. He was found to be in rapid atrial fibrillation with heart rate of 150. He was treated with Cardizem and bolus, heart rate decreased to the 90s. He was asked to be admitted and by history he had drank 1 cup of coffee that day. He was brought to the Critical Care Unit with rapid ventricular response, controlled with Cardizem and again had been off his Coumadin because of the significant bleeding episodes. Denies any significant chest pain. Did not pass out, just felt weak and tired.   REVIEW OF SYSTEMS: Denies blackout spell, syncope. He had some mild nausea, dizziness, weakness, fatigue. Denies weight loss, weight gain. No hemoptysis or hematemesis. Coumadin.  <>  change in hearing  sputum production or cough. Does have significant palpitation and tachycardia and subsequently was admitted for further evaluation and management.  SOCIAL HISTORY:  He lives with his wife. No smoking or alcohol consumption, was at church during the episode.   FAMILY  HISTORY:  Congestive heart failure, hypertension, diabetes.  MEDICATION:  He is not on Coumadin. He is on glipizide 5 mg a day, diltiazem 240 a day, hydralazine 25 twice a day, Benicar HCTZ 25/40 once a day, aspirin 81 mg a day.   PAST MEDICAL HISTORY: Includes atrial flutter, congestive heart failure, noninsulin dependent diabetes, hypertension, hyperlipidemia, CVA, gastritis, GERD.   PAST SURGICAL HISTORY:  Dental surgery, colonoscopy, EGD.   ALLERGIES:  None.   PHYSICAL EXAMINATION: VITAL SIGNS: Blood pressure was 140/90, pulse of 100, respiratory rate of 18, afebrile.  HEENT: Normocephalic, atraumatic. Pupils equal and reactive to light.  NECK: Supple. No significant JVD, bruits or adenopathy.  LUNGS: Clear to auscultation and percussion. No significant wheezing, rhonchi or rales.  HEART: Irregularly irregular. Systolic ejection murmur left sternal border. PMI is nondisplaced.  ABDOMEN: Benign.  EXTREMITIES: Within normal limits.  NEUROLOGIC: Intact.  SKIN: Normal.  OTHER LABORATORIES:  White count 7.28, hemoglobin 10, platelet count 403. Troponin negative. Glucose 169, BUN 16, creatinine 1.84. GFR 43.   EKG: Atrial fibrillation initial rapid ventricular response. Follow-up EKG showed rates of 100. Chest x-ray mild cardiomegaly, mild pulmonary vascular congestion; otherwise negative.   ASSESSMENT: 1.  Atrial fibrillation flutter. 2.  Rapid ventricular response. 3.  Diabetes. 4.  Hypertension. 5.  Hyperlipidemia. 6.  Previous history of cerebrovascular accident.  7.  History of gastrointestinal bleed in the past.   PLAN: 1.  Again rate control for atrial fibrillation and flutter. Do not recommend anticoagulation because of his recent GI bleeding and gastroenteritis. Aspirin is all he is on. Do not recommend Coumadin,  no other high-level anticoagulation but continue to treat the patient conservatively. Increase activity and, hopefully, the patient will be discharge home and  follow up with Dr. Gwen Pounds.  2.  For diabetes, continue diabetes management, moderate and control. Continue glipizide. Followup hemoglobin A1c and fasting sugars. He is not on metformin because of renal insufficiency.  3.  Hypertension. Continue therapy with Cardizem. Continue beta blocker therapy, as well as his other blood pressure medications, like hydralazine to help control his blood pressure. 4.  Renal insufficiency. Recommend nephrology input, as necessary. Continue current therapy.  5.  Hyperlipidemia. Continue statin therapy.  6.  Continue low-dose aspirin for now. Hold off on Coumadin. 7.  Treat the patient conservatively. Have the patient follow up with cardiology as an outpatient. Do not recommend any invasive studies at this point.   ____________________________ Bobbie Stack. Juliann Pares, MD ddc:ce D: 05/28/2013 16:26:17 ET T: 05/28/2013 20:17:58 ET JOB#: [id number]  cc: Yonas Bunda D. Juliann Pares, MD, <Dictator> Alwyn Pea MD ELECTRONICALLY SIGNED 07/04/2013 10:27

## 2014-10-19 NOTE — Discharge Summary (Signed)
PATIENT NAME:  Jay ClementCOLTRANE, [name] MR#:  [id number] DATE OF BIRTH:  [birth year]  DATE OF ADMISSION:  04/27/2014 DATE OF DISCHARGE:  04/30/2014  ADDENDUM  The patient underwent an upper GI on 04/29/2014 which showed gastric ulcers with clean base, normal esophagus, acute gastritis, normal duodenum and duodenal bulb. For his GI bleed, it was secondary to coagulopathy and gastric ulcers. Gastroenterology recommended the patient stop Coumadin and start aspirin 81 mg daily for his atrial fibrillation and may restart Coumadin in the future. The patient should also avoid any kind of NSAIDs. He had no evidence of bleeding, was tolerating his diet. So, below are the following medications the patient will be discharged with: 1.  Ferrous sulfate 325 mg daily.  2.  Glipizide 5 mg 2 tablets in the morning and 1 at night.  3.  Metoprolol 25 mg b.i.d.  4.  Simvastatin 10 mg daily. 5.  Hydralazine 25 mg b.i.d.  6.  HCTZ/valsartan 25/320 daily.  7.  Pantoprazole 40 b.i.d.  8.  Aspirin 81 mg daily.  9.  Maalox 5 mL 4 times a day before meals.   NOTE: The patient will stop taking NSAIDs and Coumadin.   DISCHARGE DIET: Low sodium. Soft diet for 1 week then regular.   DISCHARGE FOLLOWUP: The patient will follow up with gastroenterology in 1 week.  The patient was stable for discharge.  TIME SPENT: 35 minutes.   ____________________________ Janyth ContesSital P. Juliene PinaMody, MD spm:sb D: 04/30/2014 11:19:50 ET T: 04/30/2014 14:15:03 ET JOB#: [id number]  cc: Jarmel Linhardt P. Juliene PinaMody, MD, <Dictator> Scot Junobert T. Elliott, MD Janyth ContesSITAL P Keilen Kahl MD ELECTRONICALLY SIGNED 04/30/2014 20:32

## 2014-10-19 NOTE — H&P (Signed)
PATIENT NAME:  [name], [name] MR#:  [id number] DATE OF BIRTH:  Apr 15, 1946  DATE OF ADMISSION:  04/27/2014  ADDENDUM:    ALLERGIES: No allergies.   HOME MEDICATIONS: Aspirin 81 mg p.o. daily, Cardizem 240 mg p.o. daily, Coumadin 6 mg p.o. daily, glipizide 5 mg p.o. daily at bedtime, Zocor 10 mg p.o. daily, hydrochlorothiazide/ losartan 25 mg/320 mg p.o. tablets once a day, Lopressor 25 mg p.o. b.i.d., ferrous sulfate 325 mg p.o. daily, hydralazine 25 mg p.o. b.i.d.   TREATMENT PLAN: We will hold hydrochlorothiazide/losartan, but will continue Cardizem and Lopressor. Continue ferrous sulfate and continue hydralazine.    ____________________________ Shaune PollackQing Alaijah Gibler, MD qc:MT D: 04/27/2014 15:22:37 ET T: 04/27/2014 16:44:31 ET JOB#: [id number]  cc: Shaune PollackQing Ivette Castronova, MD, <Dictator> Shaune PollackQING Jadarius Commons MD ELECTRONICALLY SIGNED 04/27/2014 18:03

## 2014-10-19 NOTE — Discharge Summary (Signed)
PATIENT NAME:  [name], [name] MR#:  [id number] DATE OF BIRTH:  January 22, 1946  DATE OF ADMISSION:  04/27/2014 DATE OF DISCHARGE:  04/30/2014  ADDENDUM  DISCHARGE PHYSICAL EXAMINATION: VITAL SIGNS: Temperature 98.3, pulse 53, respirations 20, blood pressure 131/70, 96% on room air.  GENERAL: The patient was alert and oriented, not in acute distress.  HEART: Regular rate and rhythm. No murmurs, gallops, or rubs. PMI was not displaced. LUNGS: Clear to auscultation without crackles, rales, rhonchi or wheezing. Normal to percussion.  ABDOMEN: Bowel sounds are positive, nontender, nondistended. No hepatosplenomegaly. EXTREMITIES: No clubbing, cyanosis or edema.  ____________________________ Dallie Patton P. Juliene PinaMody, MD spm:sb D: 04/30/2014 11:26:12 ET T: 04/30/2014 14:34:17 ET JOB#: [id number]  cc: Bennye Nix P. Juliene PinaMody, MD, <Dictator> Janyth ContesSITAL P Tiawana Forgy MD ELECTRONICALLY SIGNED 04/30/2014 20:32

## 2014-10-19 NOTE — Consult Note (Signed)
Pt reports no further bleeding, his PT is down to normal level at this time.  Advised him to take tramadol and tylenol for musculoskeletal pains which would not aggravate gastritis.  He should be on iron therapy to help rebuild his blood, either iv iron before discharge or oral iron after discharge, this can be irritating to the stomach however.  Will discuss with patient and Dr. Marva PandaSkulskie tomorrow about possible EGD before discharge since we are contemplating putting him back on lower dose coumadin.  Electronic Signatures: Scot JunElliott, Robert T (MD)  (Signed on 01-Nov-15 10:46)  Authored  Last Updated: 01-Nov-15 10:46 by Scot JunElliott, Robert T (MD)

## 2014-10-19 NOTE — Consult Note (Signed)
Pt with elevated PT while on coumadin, recently taking full strength asprin 2 or more a day due to back pain.  Started passing black/marroon bowels 3 days ago. i agree with your plans.  he had an EGD in 2014 showing gastritis.  Will give a unit of blood after the 3rd FFP.  Electronic Signatures: Scot JunElliott, Robert T (MD)  (Signed on 31-Oct-15 18:12)  Authored  Last Updated: 31-Oct-15 18:12 by Scot JunElliott, Robert T (MD)

## 2014-10-19 NOTE — Discharge Summary (Signed)
PATIENT NAME:  [name], [name] MR#:  [id number] DATE OF BIRTH:  January 26, 1946  DATE OF ADMISSION:  04/27/2014 DATE OF DISCHARGE:  04/29/2014  ADMISSION DIAGNOSIS: Gastrointestinal bleed with coagulopathy.   DISCHARGE DIAGNOSES:  1.  Gastrointestinal bleed with coagulopathy.  2.  History of atrial fibrillation.  3.  Acute blood loss anemia from coagulopathy.  4.  Essential hypertension.  5. Diabetes without complications.   CONSULTATIONS: GI.   LABORATORY AND RADIOLOGIC DATA:   2.  Discharge INR is 1.1, white blood cells 5.1, hemoglobin 6.9, hematocrit 22, platelets are 169,000.    HOSPITAL COURSE: This is a very pleasant 69 year old male who was admitted on October 31st with rectal bleeding. For further details, please refer to the [name]  1.  Lower GI bleed, likely due to coagulopathy. The patient presented with rectal bleeding. He had no episode of rectal bleeding while in the hospital. He had an EGD in 2014 which showed gastritis. Coumadin was obviously held during this hospitalization. He was also taking aspirin with the Coumadin as an outpatient for some back pain, which we discussed that he should not take nonsteroidal anti-inflammatories with the Coumadin. He is status post FFP and 2 units of PRBCs.  2.  Acute blood loss anemia from coagulopathy. The patient did receive FFP and 2 units of blood and will need a repeat CBC within the next week and followup with GI.  3.  Diabetes. No complications. The patient will continue his ADA diet.  4.  Essential hypertension. The patient was continued on diltiazem, metoprolol. Blood pressure is acceptable.  5.  History of atrial fibrillation. We will restart Coumadin but at a lower dose and avoid any other nonsteroidal anti-inflammatory. He will need to follow up with his primary care physician.   DISCHARGE MEDICATIONS:  1.  Ferrous sulfate 325 daily.  2.  Glipizide 5 mg 2 tablets in the morning and 1 at bedtime.  3.  Metoprolol 25 b.i.d.  4.  simvastatin 10 mg daily. 5.  Hydralazine 25 mg b.i.d.  6.  Hydrochlorothiazide/valsartan 25 mg daily. 7.  Diltiazem 240 mg daily.  8.  Coumadin 4 mg, goal INR 2 to 2.2.  9.  Carafate 10 mL t.i.d.  10.  Pantoprazole 40 mg p.o. b.i.d. for 1 month then daily.   DISCHARGE PHYSICAL EXAMINATION: VITAL SIGNS: At discharge, the patient is afebrile, temperature 98.2. Pulse is 59, respirations 20, blood pressure 149/76, 96% on room air.  GENERAL: The patient was alert, oriented,  not in acute distress.  LUNGS: Clear to auscultation without crackles, rales, rhonchi or wheezing. Normal to percussion.   ABDOMEN: Bowel sounds are positive. Nontender, nondistended. No hepatosplenomegaly.  CARDIOVASCULAR: Regular rate and rhythm. No murmurs, gallops or rubs. PMI was not displaced. EXTREMITIES: No clubbing, cyanosis, edema. NEUROLOGICAL: Cranial nerves II-XII were intact.   TIME SPENT: Approximately 40 minutes.  CONDITION: The patient was stable for discharge.   ____________________________ Zahki Hoogendoorn P. Juliene PinaMody, MD spm:TT D: 04/29/2014 10:48:51 ET T: 04/29/2014 19:50:51 ET JOB#: [id number]  cc: Tabytha Gradillas P. Juliene PinaMody, MD, <Dictator> Scot Junobert T. Elliott, MD Dr. Ananias Pilgriminette  Vicci Reder P Kiarah Eckstein MD ELECTRONICALLY SIGNED 04/30/2014 13:12

## 2014-10-20 NOTE — Consult Note (Signed)
Present Illness 69 year old male with known diastolic dysfunction, congestive heart failure, hypertension,hyperlipidemia, valvular heart disease with acute onset of shortness of breath, weakness with rapid heartbeat and atrial flutter with rapid ventricular rate.  Upon arrival in the EKG has shown atrial flutter with rapid ventricular rate.  The patient has previously had atrial fibrillation for which she was placed on diltiazem.  The patient also had anticoagulation with Coumadin, which was working well.  It is not clear whether the patient has had new onset abnormalities and/or problems with compliance of diltiazem use.  The patient now has much better heart rate control and has had no evidence of dizziness or syncope.  He does have some mild , congestive heart failure symptoms  Family history Mother had early onset of congestive heart failure  Social history Patient currently denies alcohol or tobacco use   Physical Exam:   GEN WD    HEENT pink conjunctivae    NECK supple    RESP wheezing  crackles    CARD Irregular rate and rhythm    ABD denies tenderness  soft    LYMPH negative neck    EXTR negative cyanosis/clubbing    SKIN No rashes    NEURO cranial nerves intact    PSYCH alert   Review of Systems:   Subjective/Chief Complaint I got short of breath    Respiratory: Short of breath    Review of Systems: All other systems were reviewed and found to be negative    Medications/Allergies Reviewed Medications/Allergies reviewed     Hypertension:    congestive heart failure:    Atrial Fibrillation:    Diabetes:    CVA/Stroke:    Hypertension:    cyst removed:   Home Medications: Medication Instructions Status  metformin tablet 500 mg 2 tab(s) orally 2 times a day  Active  pravastatin 40 mg oral tablet 1 tab(s) orally once a day (at bedtime) Active  losartan 50 mg oral tablet 1 tab(s) orally once a day Active  Coumadin 6 mg oral tablet 1 tab(s) orally  once a day Active  diltiazem 120 mg/24 hours oral capsule, extended release 1 cap(s) orally once a day Active   Routine Chem:  29-Mar-13 11:03    B-Type Natriuretic Peptide [facility]) 3580  Cardiac:  29-Mar-13 11:03    CK, Total 208   CPK-MB, Serum 2.2  Routine Hem:  29-Mar-13 11:03    WBC (CBC) 5.1   RBC (CBC) 5.64   Hemoglobin (CBC) 11.7   Hematocrit (CBC) 38.2   Platelet Count (CBC) 378   MCV 68   MCH 20.7   MCHC 30.6   RDW 16.2  Routine Chem:  29-Mar-13 11:03    Glucose, Serum 152   BUN 13   Creatinine (comp) 1.28   Sodium, Serum 142   Potassium, Serum 3.8   Chloride, Serum 107   CO2, Serum 23   Calcium (Total), Serum 9.0  Hepatic:  29-Mar-13 11:03    Bilirubin, Total 0.3   Alkaline Phosphatase 53   SGPT (ALT) 20   SGOT (AST) 18   Total Protein, Serum 8.2   Albumin, Serum 3.7  Routine Chem:  29-Mar-13 11:03    Osmolality (calc) 286   eGFR (African American) >60   eGFR (Non-African American) 60   Anion Gap 12  Cardiac:  29-Mar-13 11:03    Troponin I 0.02  Routine Coag:  29-Mar-13 11:03    Prothrombin 21.6   INR 1.8  Routine Chem:  29-Mar-13 11:03  Magnesium, Serum 1.4  Blood Glucose:  29-Mar-13 16:35    POCT Blood Glucose 111  Cardiac:  29-Mar-13 19:21    Troponin I < 0.02  Blood Glucose:  29-Mar-13 19:29    POCT Blood Glucose 172  Routine Chem:  30-Mar-13 03:02    Glucose, Serum 122   BUN 15   Creatinine (comp) 1.28   Sodium, Serum 143   Potassium, Serum 4.0   Chloride, Serum 107   CO2, Serum 24   Calcium (Total), Serum 8.3   Osmolality (calc) 287   eGFR (African American) >60   eGFR (Non-African American) 60   Anion Gap 12  Cardiac:  30-Mar-13 03:02    Troponin I < 0.02   EKG:   EKG Interp. by me    Interpretation atrial flutter with rapid ventricular rate and nonspecific ST and T-wave changes    No Known Allergies:   Vital Signs/Nurse's Notes: **Vital Signs.:   30-Mar-13 04:07   Vital Signs Type Routine   Temperature  Temperature (F) 97.9   Celsius 36.6   Pulse Pulse 54   Respirations Respirations 16   Systolic BP Systolic BP 753   Diastolic BP (mmHg) Diastolic BP (mmHg) 84   Mean BP 106   Pulse Ox % Pulse Ox % 98   Pulse Ox Activity Level  At rest   Oxygen Delivery Room Air/ 21 %     Impression 69 year old male with diastolic dysfunction, congestive hea, hypertension, hyperlipidemia, diabetes mellitus with recurrent episode of atrial flutter with rapid ventricular rate, dizziness and shortness of breath    Plan 1.  Continue diltiazem, but increase dose for better heart rate control and maintenance of normal sinus rhythm. 2.  Continue anticoagulation with a goal INR between 2-3 for further risk reduction and stroke with atrial fibrillation. 3.  Further hypertension control with ACE inhibitor and renal protection. 4.  Ambulate and follow for adjustments of medical regimen for heart rate control and blood pressure control. 5.  No further cardiac diagnostics necessary at this time   Electronic Signatures: Corey Skains (MD)  (Signed 30-Mar-13 07:27)  Authored: General Aspect/Present Illness, History and Physical Exam, Review of System, Past Medical History, Home Medications, Labs, EKG , Allergies, Vital Signs/Nurse's Notes, Impression/Plan   Last Updated: 30-Mar-13 07:27 by Corey Skains (MD)

## 2014-10-20 NOTE — Discharge Summary (Signed)
PATIENT NAME:  [name], [name] DATE OF BIRTH:  1945/09/22  DATE OF ADMISSION:  07/04/2011 DATE OF DISCHARGE:  07/05/2011  CHIEF COMPLAINT: Shortness of breath, palpitations.   DISCHARGE DIAGNOSES:  1. New onset atrial flutter.  2. Hypertension.  3. History of cerebrovascular accident. 4. Diabetes.   DISCHARGE MEDICATIONS:  1. Metformin 500 mg 2 tabs twice a day.  2. Vitamin D3 50,000 international units weekly.  3. Hydrochlorothiazide/losartan 25/100 mg one tab daily.  4. Hydrochlorothiazide 25 mg daily.  5. Losartan 50 mg daily.  6. Pravastatin 40 mg daily.  7. Fish oil 500 mg daily.  8. Diltiazem extended-release 120 mg daily.  9. Coumadin 7.5 mg daily.  10. Aspirin 81 mg daily.   DIET: Low sodium, ADA diabetic diet.   ACTIVITY: As tolerated.   DISCHARGE FOLLOWUP: Please follow-up with your PCP for INR check in the middle of this week and also at the end of this week. Please followup with Dr. Harold HedgeKenneth Fath in 2 to 3 weeks.   HISTORY OF PRESENT ILLNESS: Please see the full History and Physical dictated on 07/04/2011. In brief this is a 69 year old male with history of hypertension and congestive heart failure class II who came in with shortness of breath and palpitations for a couple of days. On arrival he was found to have atrial flutter. He was given diltiazem in the ER and started on diltiazem and then converted back to sinus. He was admitted to the hospitalist service for further evaluation.   SIGNIFICANT LABS/IMAGING: Initial creatinine 1.41, BUN 21, and glucose 223. On discharge creatinine was 1.33. Troponins negative x4. TSH 1.6. Initial WBC 5.3, hemoglobin 12.8, hematocrit 40.8, and platelets 305.   Occult feces negative.   Echocardiogram: Ejection fraction greater than 55%, trace pulmonary regurgitation and mitral regurgitation.   X-ray of the chest: Cardiomegaly. No acute cardiopulmonary disease.   HOSPITAL COURSE: The patient was admitted to  telemetry. The patient remained in sinus rhythm. His losartan dose was decreased to 50 mg so we can add Cardizem. He was seen by cardiology and an echocardiogram was ordered, with the result as above. The patient does not have any history of atrial fibrillation or flutter in the past. However with the patient being a high risk stroke patient given history of stroke present as well as hypertension, diabetes and CHADS score being 3 he was started on Coumadin on discharge. TSH was checked and it was within normal limits. His outpatient medications, including diabetes medications for his diabetes, were resumed. He is to follow up with his PCP for an INR check this week and then also with Dr. Lady GaryFath in 2 to 3 weeks for follow-up.   DISPOSITION: Home.   CODE STATUS: FULL CODE.   TOTAL TIME SPENT: 35 minutes.  ____________________________ Krystal EatonShayiq Famous Eisenhardt, MD sa:slb D: 07/05/2011 18:04:46 ET     T: 07/06/2011 16:48:42 ET        JOB#: [id number] cc: Krystal EatonShayiq Natosha Bou, MD, <Dictator> Krystal EatonSHAYIQ Vedanth Sirico MD ELECTRONICALLY SIGNED 07/09/2011 20:37

## 2014-10-20 NOTE — Consult Note (Signed)
Brief Consult Note: Diagnosis: atiral fibrillation/flutter/chf/hypertension.   Patient was seen by consultant.   Recommend further assessment or treatment.   Orders entered.   Comments: 69 yo male [facility] after presenting with shortenss of breath and dizziness. Noted to bae in afib/flutter on admission converted to sinus rhythm. CHADS score of 3. Was seen in chf clinic in the past. Possible sleep apnea with daytime somnolence and snoring. Ruled out for mi. Will review echo and order over night pulse ox regarding possible sleep apnea. Consider chronic anticoagulation given chads score prior to discharge.  Electronic Signatures: Dalia HeadingFath, Paiten Boies A (MD)  (Signed 06-Jan-13 13:34)  Authored: Brief Consult Note   Last Updated: 06-Jan-13 13:34 by Dalia HeadingFath, Londynn Sonoda A (MD)

## 2014-10-20 NOTE — H&P (Signed)
PATIENT NAME:  [name], [name] MR#:  [id number] DATE OF BIRTH:  Oct 28, 1945  DATE OF ADMISSION:  09/24/2011  PRIMARY CARE PHYSICIAN: Dr. Andrey SpearmanArchinal   CHIEF COMPLAINT: Dizziness.   HISTORY OF PRESENT ILLNESS: A 69 year old male who has history of diabetes, hypertension, hyperlipidemia, remote history of stroke and also history of congestive heart failure which is compensated. He had a new diagnosis of atrial flutter in 06/2011. He was discharged on Cardizem and he was started on Coumadin because of CHADS score of 3 with previous history of stroke. Today he comes. He said the same thing happened again. After he woke up he felt dizzy. He said he took all of morning medications at home, Cardizem and losartan. He was feeling some palpitations initially also. He felt as if he is going to pass out. He denied any chest pain, any diaphoresis, any nausea, vomiting, any headache. When he came to the Emergency Room he was found to be in rapid atrial flutter at the rate of 160 to 170. He says he has been compliant with his medications. He got a bolus of 10 mg of IV Cardizem and started on Cardizem drip at 5 mg/[name]. On the Cardizem drip his heart rate is controlled in the range of 80 to 90. He denies any dizziness or palpitation right now. His INR is also close to therapeutic, 1.8 on Coumadin 6 mg daily. He said he has difficulty regulating Coumadin. Hospitalist was asked to admit the patient because of atrial flutter with rapid ventricular response.   REVIEW OF SYSTEMS: He denies any fever, weakness. No acute change in vision. No headache. He was complaining of dizziness as if he was going to pass out. No cough. No dyspnea. No chest pain but complaining of some palpitations. No nausea, vomiting, diarrhea, abdominal pain. No GI bleed. No dysuria. No frequency. No thyroid problems. No anemia. No rash. No joint pains or swelling. No focal numbness or weakness. No anxiety.   PAST MEDICAL HISTORY:  1. New diagnosis of  atrial flutter in 06/2011. He was seen by Dr. Lady GaryFath at that time. Echocardiogram was done in January which shows that he has ejection fraction of greater than 55%, left ventricular function is normal, trace MR, right ventricular pressure is elevated at 30 to 40 mmHg.  2. Diabetes.  3. Hypertension.  4. Hyperlipidemia.  5. History of stroke. 6. History of congestive heart failure in the past but appears to be compensated at this time.   PAST SURGICAL HISTORY: None.   ALLERGIES TO MEDICATIONS: None.   HOME MEDICATIONS:  1. Coumadin 6 mg daily.  2. Diltiazem 120 mg daily.  3. Losartan 50 mg daily. 4. Metformin 1000 mg b.i.d.  5. Pravastatin 40 mg at bedtime.   SOCIAL HISTORY: Lives with his wife. No smoking, alcohol or drug use.   FAMILY HISTORY: His mother had congestive heart failure and hypertension. Sister has diabetes.   PHYSICAL EXAMINATION:  VITAL SIGNS: His vitals when he presented to the Emergency Room: Temperature 97.8, heart rate 92, respiratory rate 24, blood pressure 134/97, saturating 100% on room air. Currently, heart rate 84 to 90, blood pressure 170/83, saturating 100% on room air.   GENERAL: This is an elderly [place] male, well built, comfortably lying in bed. Symptoms have resolved right now.   HEENT: Bilateral pupils are equal. Extraocular muscles are intact. No scleral icterus. No conjunctivitis. Oral mucosa is moist. No pallor.   NECK: No thyroid tenderness, enlargement or nodule. Neck is supple. No  masses, nontender. No adenopathy. No JVD. No carotid bruit.   CHEST: Bilateral breath sounds are clear. No wheeze. Normal effort. No respiratory distress.   HEART: Heart sounds are irregularly regular. No murmur. Good peripheral pulses. No lower extremity edema.   ABDOMEN: Soft, nontender. Normal bowel sounds. No hepatomegaly. No bruit. No masses.   RECTAL: Deferred.   NEUROLOGIC: He is awake, alert, oriented to time, place, and person. Cranial nerves are  intact. Moving all extremities against gravity.   EXTREMITIES: No cyanosis. No clubbing.   SKIN: No rash. No lesions.  LABORATORY, DIAGNOSTIC AND RADIOLOGICAL DATA: White count 5.1, hemoglobin 11.7, platelet count 378,000. BMP: Sodium 142, potassium 3.8, BUN 13, creatinine 1.28. LFTs are normal. BNP 3580. Troponin negative. He had a TSH last time that was 1.6. INR 1.8. His chest x-ray: Cardiomegaly but no overt evidence of congestive heart failure. His echocardiogram shows that he has ejection fraction of greater than 55%, right ventricular pressure slightly elevated at 30 to 40 mmHg. His EKG shows that he has atrial fibrillation at the rate of 160 to 170,  atrial fibrillation/atrial flutter with some PVCs.   IMPRESSION:  1. Atrial fibrillation/atrial flutter with rapid ventricular response. 2. Dizziness, presyncope most likely secondary to rapid atrial flutter. 3. Hypertension. 4. Diabetes. 5. History of cerebrovascular accident.  6. History of congestive heart failure but compensated at this time.   PLAN: A 69 year old male with history of diabetes, hypertension, history of congestive heart failure compensated, history of stroke in the past admitted in 06/2011 because of new diagnosis of atrial flutter. He is on Coumadin now and he is on Cardizem 120 mg daily. He again presents with dizziness and rapid atrial flutter. He got a bolus of 10 mg of IV Cardizem. He is on Cardizem drip at 5 mg/[name]. I am going to start him on p.o. Cardizem at 60 mg p.o. every six hours and see if he can taper off the Cardizem drip. Will continue his Coumadin. His INR is close to therapeutic. His blood pressure is elevated. I am going to go up on Lanoxin to 100 mg daily. Continue metformin and pravastatin. His TSH was normal last time. I am continuing Coumadin for now but if he has problems with regulating his INR levels with Coumadin then he can be considered for Pradaxa.     TIME SPENT WITH ADMISSION AND COORDINATION:  50 minutes.   ____________________________ Fredia Sorrow, MD ag:cms D: 09/24/2011 13:24:29 ET T: 09/24/2011 15:53:12 ET JOB#: [id number]  cc: Fredia Sorrow, MD, <Dictator> Ginette A. Andrey Spearman, MD Fredia Sorrow MD ELECTRONICALLY SIGNED 10/18/2011 12:08

## 2014-10-20 NOTE — Discharge Summary (Signed)
PATIENT NAME:  [name], [name] MR#:  [id number] DATE OF BIRTH:  [birth year]  DATE OF ADMISSION:  09/24/2011 DATE OF DISCHARGE:  09/25/2011  DISCHARGE DIAGNOSES:  1. Dizziness secondary to rapid atrial flutter/atrial fibrillation. 2. Hypertension. 3. Diabetes. 4. Remote history of cerebrovascular accident. 5. History of congestive heart failure, compensated, most likely chronic diastolic.  CONSULT: Cardiology, Dr. Trey SailorsKowalski    HOSPITAL COURSE: This is a 69 year old male who has history of diabetes, hypertension, and remote history of CVA. He was diagnosed with new diagnosis of atrial flutter in January of 2013. His echo at that time showed EF of 55%, left ventricular function, normal right ventricular pressures at 30 to 40 mmHg. He was started on Coumadin and Cardizem. He presented on March 29th. When he presented he presented with dizziness. He was found to be in rapid atrial flutter/atrial fibrillation. His heart rate was 160's to 170's. He got a bolus of 10 mg of IV Cardizem in the Emergency Room and he was initially started on Cardizem drip at 5 mg per hour. He was restarted on p.o. Cardizem and then he converted to sinus rhythm. He is sinus bradycardic in the range of 55 to 60 so I'm going to continue his Cardizem CD at 120 mg a day that he was taking at home. His blood pressure was slightly elevated and we have increased his losartan to 100 mg daily. His blood pressure medications can be further optimized as outpatient. When he came in his hemoglobin was stable at 11.7. His creatinine was normal at 1.28. LFTs were normal. His troponins were negative. His BNP was elevated at 3580 but his chest x-ray was negative. His chest was clear so his CHF was compensated. His magnesium was 1.4 yesterday. We gave him 2 grams of mag sulfate and I'm going to give him some magnesium oxide for home for two days. He has no further dizziness. No shortness of breath. No chest pain. His creatinine is stable. His chest  x-ray shows cardiomegaly without any evidence of overt congestive heart failure. He needs to follow-up with Dr. Gwen PoundsKowalski as outpatient.   MEDICATIONS AT DISCHARGE:  1. Metformin 500 mg 2 tablets twice a day. 2. Pravastatin 40 mg at bedtime.  3. Coumadin 6 mg daily.  4. Diltiazem 120 mg daily.  5. Advance losartan to 100 mg (2 tablets of 50 mg) p.o. once daily.  6. Magnesium oxide 400 mg p.o. b.i.d. for two days.   CONDITION AT DISCHARGE: He is comfortable. T-max 98.1, heart rate 66, blood pressure 168/69, saturating 100% on room air. CHEST is clear. HEART sounds are regular. ABDOMEN soft and nontender.   DIET: Advised a low sodium, ADA diet.   FOLLOW-UP:  1. The patient should follow-up with Dr. Andrey SpearmanArchinal in one week. Follow-up INR at Dr. Threasa BeardsArchinal's office. His INR when he presented yesterday was 1.8.  2. Follow-up with Dr. Gwen PoundsKowalski in 1 to 2 weeks.   TIME SPENT WITH DISCHARGE: 40 minutes.   ____________________________ Fredia SorrowAbhinav Adea Geisel, MD ag:drc D: 09/25/2011 11:47:09 ET T: 09/27/2011 11:16:56 ET JOB#: [id number]  cc: Fredia SorrowAbhinav Jordane Hisle, MD, <Dictator> Ginette A. Andrey SpearmanArchinal, MD [name], MD Fredia SorrowABHINAV Gearldine Looney MD ELECTRONICALLY SIGNED 10/07/2011 17:17

## 2014-10-20 NOTE — H&P (Signed)
PATIENT NAME:  [name], FILLER MR#:  [id number] DATE OF BIRTH:  11-Sep-1945  DATE OF ADMISSION:  07/04/2011  PRIMARY CARE PHYSICIAN:  Dr. Andrey Spearman at [facility]  CHIEF COMPLAINT: Shortness of breath and palpitations times two days.   HISTORY OF PRESENT ILLNESS: Mr. Figgs is a 69 year old pleasant African American male with past medical history of systemic hypertension and history of congestive heart failure, [place] classification stage II. His ejection fraction is unknown to me. The patient was in his usual state of health until about two days ago when he noticed shortness of breath associated with palpitations without chest pain. He denies any cough, leg pain, or swelling. His symptoms persisted and he decided to come to the Emergency Department where he was found to have new onset of atrial flutter with rapid ventricular right. The patient responded to an intravenous dose of  diltiazem. His symptoms have resolved.   REVIEW OF SYSTEMS:  CONSTITUTIONAL: He denies fever. No chills. No night sweats.  EYES: Denies any blurring of vision. No double vision. ENT: No hearing impairment. No sore throat. No dysphagia. CARDIOVASCULAR: No chest pain. No peripheral edema. No syncope. He admits having dizziness or lightheadedness associated with his shortness of breath and palpitations. RESPIRATORY: Admits shortness of breath but no cough. No chest pain. GASTROINTESTINAL: No abdominal pain. No nausea, no vomiting. No diarrhea. GENITOURINARY: No dysuria or frequency of urination. MUSCULOSKELETAL: No joint pain or swelling. No muscular pain or swelling. INTEGUMENT: No skin rash. No ulcers.  NEUROLOGIC: No focal weakness. No seizure activity. No headache. No syncope but had lightheadedness. PSYCHIATRY: No anxiety. No depression. ENDOCRINE: No night sweats. No heat or cold intolerance.   PAST MEDICAL HISTORY:  1. History of systemic hypertension.  2. History of congestive heart failure [place] classification  stage II followed by congestive heart failure clinic. Last time he was seen was in February 2012. There is no further evaluation after that and no echocardiogram since then. He does not recall the last echocardiogram, but it was more than a year ago.  3. History of hypercholesterolemia.  4. Remote history of stroke. 5. Type 2 diabetes mellitus.  6. History of vitamin D deficiency.   FAMILY HISTORY: His mother suffered from hypertension, congestive heart failure, and diabetes. She is deceased now. He has a sister who has diabetes. There is no family history of premature coronary artery disease.   SOCIAL HISTORY: He is married, living with his wife. He is retired from working in a mill.    SOCIAL HABITS: Nonsmoker. No history of alcohol or drug abuse.   ADMISSION MEDICATIONS:  1. Pravastatin 20 mg at night. 2. Losartan with hydrochlorothiazide 100/25 once a day.  3. Metformin 500 mg twice a day.  4. Enteric-coated aspirin 325 mg once a day. 5. Vitamin D3 1000 units once a day.   6. Omega fish oil daily.   ALLERGIES: No known drug allergies.   PHYSICAL EXAMINATION:  VITAL SIGNS: Blood pressure 131/76. His initial pulse when he came in was 158. It is now 21. Respiratory rate 20, temperature 98.5, oxygen saturation is 97%.   GENERAL APPEARANCE: Elderly male laying in bed in no acute distress.   HEENT:  No pallor. No icterus. No cyanosis. ENT: Hearing was normal. Nasal mucosa, lips, tongue were normal. EYES:  Normal eyelids and conjunctivae. Pupils about 3 mm, equal, sluggishly reactive to light.   NECK: Supple. Trachea at midline. No thyromegaly. No cervical lymphadenopathy. No masses.   HEART: Normal S1, S2.  No S3, S4. No murmur. No gallop. No carotid bruits.   RESPIRATORY: Normal breathing pattern without use of accessory muscles. No rales. No wheezing.   ABDOMEN: Soft without tenderness. No hepatosplenomegaly. No masses. No hernias.   SKIN: No ulcers. No subcutaneous nodules. He  has very dry skin, especially on the lower extremities. There are deformities of his nails off both hands.   MUSCULOSKELETAL: No joint swelling. No clubbing.   NEUROLOGIC: The patient is alert and oriented times three. Cranial nerves II through XII are intact. No focal motor deficit.   PSYCHIATRIC: The patient alert and oriented times three. Mood and affect were normal.   LABORATORY, DIAGNOSTIC, AND RADIOLOGICAL DATA: EKG showed atrial flutter with rapid ventricular rate with 3:1 conduction. Poor progression of R waves. Nonspecific ST-T wave abnormalities. Subsequent EKG after conversion to normal sinus rhythm showed normal sinus rhythm at rate of 83 per minute, left axis deviation, Q waves in leads III and aVF suspicious for old inferior myocardial infarction. Incomplete right bundle branch block. Nonspecific T-wave abnormalities in the lateral leads. This EKG is unchanged compared to his EKG on 05/26/2010.   Serum glucose 223, BUN 21 creatinine 1.4, sodium 141, potassium 3.8, estimated GFR is more than 60. Total protein 8.3, albumin 3.9. Normal liver transaminases. Total CPK was slightly elevated at 405. Normal CK-MB fraction at 2.1. Normal troponin at less than 0.02. CBC showed white count 5000, hemoglobin 12.8, hematocrit 40, platelet count 305, MCV was low at 72, MCH 22, MCHC 31. Chest x-ray showed cardiomegaly. No consolidation. No effusions.   ASSESSMENT:  1. New onset of atrial flutter with rapid ventricular rate, converted to normal sinus rhythm. 2. Shortness of breath and dizziness secondary to atrial flutter. This had resolved after conversion to normal sinus rhythm.  3. Microcytic hypochromic anemia suspicious for iron deficiency anemia.  4. History of systemic hypertension.  5. History of congestive heart failure, [place] classification stage II.  6. History of diabetes mellitus, type 2.  7. History of hypercholesterolemia.  8. History of stroke.  9. History of vitamin D  deficiency.   PLAN: The patient was admitted to the telemetry unit. We will follow up on his cardiac enzymes q. 8 h. Check thyroid-stimulating hormone or TSH. Daily weights. Salt restriction. Obtain echocardiogram to assess LV function and any valvular abnormalities.  Continue ARB or angiotensin receptor blocker using losartan,  but I will reduce the dose from 100 to 50 mg to allow room to use diltiazem. Since the patient responded to intravenous diltiazem, I will continue diltiazem orally using extended-release 120 mg once a day.   For work-up for his anemia I will check serum iron, TIBC, and ferritin. I will send stool for guaiac times one.  For the issue of chronic anticoagulation, I will leave that decision to the cardiologist. I requested official cardiology consultation with Dr. Lady GaryFath. Meanwhile, I will continue aspirin once a day and deep vein thrombosis prophylaxis using Lovenox 40 mg subcutaneous once a day. Peptic ulcer disease prophylaxis using Protonix 40 mg p.o. daily.   TIME NEEDED TO EVALUATE THIS PATIENT: More than 55 minutes, including discussion with the family and outlining the line of management and work-up.     ____________________________ Carney CornersAmir M. Rudene Rearwish, MD amd:bjt D: 07/04/2011 01:08:06 ET T: 07/04/2011 11:06:30 ET JOB#: [id number]  cc: Carney CornersAmir M. Rudene Rearwish, MD, <Dictator> Ginette A. Andrey SpearmanArchinal, MD Zollie ScaleAMIR M Kamora Vossler MD ELECTRONICALLY SIGNED 07/06/2011 4:59

## 2015-02-04 ENCOUNTER — Other Ambulatory Visit: Payer: Self-pay | Admitting: Family Medicine

## 2015-02-05 ENCOUNTER — Ambulatory Visit (INDEPENDENT_AMBULATORY_CARE_PROVIDER_SITE_OTHER): Payer: Commercial Managed Care - HMO | Admitting: Family Medicine

## 2015-02-05 ENCOUNTER — Encounter: Payer: Self-pay | Admitting: Family Medicine

## 2015-02-05 VITALS — BP 132/75 | HR 65 | Temp 98.3°F | Resp 19 | Ht 69.0 in | Wt 212.8 lb

## 2015-02-05 DIAGNOSIS — I480 Paroxysmal atrial fibrillation: Secondary | ICD-10-CM | POA: Diagnosis not present

## 2015-02-05 DIAGNOSIS — K922 Gastrointestinal hemorrhage, unspecified: Secondary | ICD-10-CM | POA: Insufficient documentation

## 2015-02-05 DIAGNOSIS — E785 Hyperlipidemia, unspecified: Secondary | ICD-10-CM

## 2015-02-05 DIAGNOSIS — E1165 Type 2 diabetes mellitus with hyperglycemia: Secondary | ICD-10-CM

## 2015-02-05 DIAGNOSIS — E118 Type 2 diabetes mellitus with unspecified complications: Secondary | ICD-10-CM | POA: Diagnosis not present

## 2015-02-05 DIAGNOSIS — IMO0002 Reserved for concepts with insufficient information to code with codable children: Secondary | ICD-10-CM

## 2015-02-05 DIAGNOSIS — I10 Essential (primary) hypertension: Secondary | ICD-10-CM

## 2015-02-05 DIAGNOSIS — N183 Chronic kidney disease, stage 3 unspecified: Secondary | ICD-10-CM

## 2015-02-05 DIAGNOSIS — I4891 Unspecified atrial fibrillation: Secondary | ICD-10-CM | POA: Insufficient documentation

## 2015-02-05 MED ORDER — GLIPIZIDE 10 MG PO TABS: 10.0000 mg | ORAL_TABLET | Freq: Two times a day (BID) | ORAL | Status: DC

## 2015-02-05 MED ORDER — GABAPENTIN 300 MG PO CAPS: 300.0000 mg | ORAL_CAPSULE | Freq: Three times a day (TID) | ORAL | Status: DC

## 2015-02-05 MED ORDER — METOPROLOL TARTRATE 25 MG PO TABS: 25.0000 mg | ORAL_TABLET | Freq: Two times a day (BID) | ORAL | Status: DC

## 2015-02-05 MED ORDER — HYDRALAZINE HCL 25 MG PO TABS: 25.0000 mg | ORAL_TABLET | Freq: Three times a day (TID) | ORAL | Status: DC

## 2015-02-05 NOTE — Progress Notes (Signed)
Name: [name]   MRN: [id number]    DOB: 04/23/1946   Date:02/05/2015       Progress Note  Subjective  Chief Complaint  Chief Complaint  Patient presents with  . Follow-up    3 mo.  . Diabetes  . Hypertension  . Hyperlipidemia  . Medication Refill    Glipizide  / Metoprolol tartrate     Diabetes He presents for his follow-up diabetic visit. He has type 2 diabetes mellitus. His disease course has been stable. Pertinent negatives for hypoglycemia include no headaches. Pertinent negatives for diabetes include no blurred vision, no chest pain and no fatigue. Diabetic complications include a CVA, heart disease and peripheral neuropathy. Current diabetic treatment includes oral agent (monotherapy) and diet. He is following a diabetic diet. His breakfast blood glucose range is generally 90-110 mg/dl. An ACE inhibitor/angiotensin II receptor blocker is being taken.  Hypertension This is a chronic problem. The problem is controlled. Pertinent negatives include no blurred vision, chest pain, headaches, palpitations or shortness of breath. Past treatments include angiotensin blockers, diuretics, beta blockers and central alpha agonists. Hypertensive end-organ damage includes kidney disease, CAD/MI and CVA.  Hyperlipidemia This is a chronic problem. The problem is controlled. Recent lipid tests were reviewed and are normal. Exacerbating diseases include diabetes. Pertinent negatives include no chest pain, leg pain or shortness of breath. Current antihyperlipidemic treatment includes statins. There are no compliance problems.  Risk factors for coronary artery disease include dyslipidemia, male sex, hypertension and diabetes mellitus.     Past Medical History  Diagnosis Date  . Ventricular tachycardia 05/20/2014  . Atrial fibrillation and flutter   . GI bleeding   . Gastric ulcer   . Diabetes mellitus with complication   . Essential hypertension     Past Surgical History   Procedure Laterality Date  . Left heart catheterization with coronary angiogram N/A 05/20/2014    Procedure: LEFT HEART CATHETERIZATION WITH CORONARY ANGIOGRAM;  Surgeon: Micheline Chapman, MD;  Location: [facility] CATH LAB;  Service: Cardiovascular;  Laterality: N/A;    History reviewed. No pertinent family history.  Social History   Social History  . Marital Status: Married    Spouse Name: N/A  . Number of Children: N/A  . Years of Education: N/A   Occupational History  . Not on file.   Social History Main Topics  . Smoking status: Former Smoker    Quit date: 04/28/1964  . Smokeless tobacco: Not on file  . Alcohol Use: Not on file  . Drug Use: Not on file  . Sexual Activity: Not on file   Other Topics Concern  . Not on file   Social History Narrative   Married, lives in [place]. Retired from [employer] work. Nonsmoker, no Etoh.     Current outpatient prescriptions:  .  acetaminophen (TYLENOL) 500 MG tablet, Take 1,000 mg by mouth daily as needed (pain)., Disp: , Rfl:  .  alum & mag hydroxide-simeth (MAALOX/MYLANTA) 200-200-20 MG/5ML suspension, Take 30 mLs by mouth 4 (four) times daily - after meals and at bedtime., Disp: , Rfl:  .  CARTIA XT 120 MG 24 hr capsule, , Disp: , Rfl:  .  glipiZIDE (GLUCOTROL) 10 MG tablet, Take by mouth., Disp: , Rfl:  .  hydrALAZINE (APRESOLINE) 25 MG tablet, Take 1 tablet (25 mg total) by mouth every 8 (eight) hours., Disp: 90 tablet, Rfl: 5 .  IRON PO, Take 1 tablet by mouth daily., Disp: , Rfl:  .  metoprolol tartrate (  LOPRESSOR) 25 MG tablet, Take 25 mg by mouth 2 (two) times daily., Disp: , Rfl:  .  nitroGLYCERIN (NITROSTAT) 0.4 MG SL tablet, Place 1 tablet (0.4 mg total) under the tongue every 5 (five) minutes x 3 doses as needed for chest pain., Disp: 25 tablet, Rfl: 12 .  simvastatin (ZOCOR) 10 MG tablet, Take 10 mg by mouth daily., Disp: , Rfl:  .  valsartan-hydrochlorothiazide (DIOVAN-HCT) 320-25 MG per tablet, , Disp: , Rfl:  .  warfarin  (COUMADIN) 5 MG tablet, Take 1 tablet (5 mg total) by mouth daily., Disp: 30 tablet, Rfl: 11  No Known Allergies   Review of Systems  Constitutional: Negative for fatigue.  Eyes: Negative for blurred vision.  Respiratory: Negative for shortness of breath.   Cardiovascular: Negative for chest pain and palpitations.  Neurological: Negative for headaches.      Objective  Filed Vitals:   02/05/15 0924  BP: 132/75  Pulse: 65  Temp: 98.3 F (36.8 C)  TempSrc: Oral  Resp: 19  Height: 5\' 9"  (1.753 m)  Weight: 212 lb 12.8 oz (96.525 kg)  SpO2: 98%    Physical Exam  Constitutional: He is oriented to person, place, and time and well-developed, well-nourished, and in no distress.  HENT:  Head: Normocephalic and atraumatic.  Cardiovascular: Normal rate and regular rhythm.   Pulmonary/Chest: Effort normal and breath sounds normal.  Neurological: He is alert and oriented to person, place, and time.  Nursing note and vitals reviewed.     Assessment & Plan 1. Essential hypertension Blood pressure is stable on present therapy. Refills for metoprolol provided. - metoprolol tartrate (LOPRESSOR) 25 MG tablet; Take 1 tablet (25 mg total) by mouth 2 (two) times daily.  Dispense: 180 tablet; Refill: 1 - hydrALAZINE (APRESOLINE) 25 MG tablet; Take 1 tablet (25 mg total) by mouth 3 (three) times daily.  Dispense: 270 tablet; Refill: 1  2. CKD (chronic kidney disease) stage 3, GFR 30-59 ml/min  - Comprehensive metabolic panel  3. Diabetes mellitus type 2, uncontrolled, with complications A1c is not at goal at 9.5% in May 2016. Recheck and adjust medications as necessary. 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 Follow-up in 4 months. - glipiZIDE (GLUCOTROL) 10 MG tablet; Take 1 tablet (10 mg total) by mouth 2 (two) times daily before a meal.  Dispense: 180 tablet; Refill: 1 - HgB A1c - gabapentin (NEURONTIN) 300 MG capsule; Take 1 capsule (300 mg total) by mouth 3  (three) times daily. 300 mg po qd x 1 day, then 300 mg po bid x 1 day, then 300mg  po tid  Dispense: 270 capsule; Refill: 1  4. Dyslipidemia Recheck fasting lipid panel and follow-up. - Comprehensive metabolic panel - Lipid Profile  5. Paroxysmal atrial fibrillation Currently in sinus rhythm. Patient is on anticoagulation with Coumadin. Managed by cardiology.    Lakechia Nay Asad A. [facility] 02/05/2015 10:09 AM

## 2015-02-06 LAB — COMPREHENSIVE METABOLIC PANEL
ALT: 35 IU/L (ref 0–44)
AST: 17 IU/L (ref 0–40)
Albumin/Globulin Ratio: 1.5 (ref 1.1–2.5)
Albumin: 4.6 g/dL (ref 3.5–4.8)
Alkaline Phosphatase: 47 IU/L (ref 39–117)
BUN/Creatinine Ratio: 13 (ref 10–22)
BUN: 20 mg/dL (ref 8–27)
Bilirubin Total: 0.3 mg/dL (ref 0.0–1.2)
CALCIUM: 9.9 mg/dL (ref 8.6–10.2)
CHLORIDE: 99 mmol/L (ref 97–108)
CO2: 24 mmol/L (ref 18–29)
Creatinine, Ser: 1.52 mg/dL — ABNORMAL HIGH (ref 0.76–1.27)
GFR calc Af Amer: 53 mL/min/{1.73_m2} — ABNORMAL LOW (ref >59)
GFR calc non Af Amer: 46 mL/min/{1.73_m2} — ABNORMAL LOW (ref >59)
Globulin, Total: 3 g/dL (ref 1.5–4.5)
Glucose: 120 mg/dL — ABNORMAL HIGH (ref 65–99)
Potassium: 4.3 mmol/L (ref 3.5–5.2)
Sodium: 139 mmol/L (ref 134–144)
Total Protein: 7.6 g/dL (ref 6.0–8.5)

## 2015-02-06 LAB — LIPID PANEL
Chol/HDL Ratio: 3.3 ratio units (ref 0.0–5.0)
Cholesterol, Total: 162 mg/dL (ref 100–199)
HDL: 49 mg/dL (ref >39)
LDL CALC: 91 mg/dL (ref 0–99)
Triglycerides: 111 mg/dL (ref 0–149)
VLDL Cholesterol Cal: 22 mg/dL (ref 5–40)

## 2015-02-06 LAB — HEMOGLOBIN A1C
ESTIMATED AVERAGE GLUCOSE: 189 mg/dL
Hgb A1c MFr Bld: 8.2 % — ABNORMAL HIGH (ref 4.8–5.6)

## 2015-02-10 DIAGNOSIS — I10 Essential (primary) hypertension: Secondary | ICD-10-CM | POA: Diagnosis not present

## 2015-02-10 DIAGNOSIS — I739 Peripheral vascular disease, unspecified: Secondary | ICD-10-CM | POA: Diagnosis not present

## 2015-02-10 DIAGNOSIS — I5032 Chronic diastolic (congestive) heart failure: Secondary | ICD-10-CM | POA: Diagnosis not present

## 2015-04-16 ENCOUNTER — Inpatient Hospital Stay
Admission: EM | Admit: 2015-04-16 | Discharge: 2015-04-17 | DRG: 309 | Disposition: A | Discharge status: Home / Self Care | Payer: Commercial Managed Care - HMO | Attending: Internal Medicine | Admitting: Internal Medicine

## 2015-04-16 ENCOUNTER — Emergency Department: Payer: Commercial Managed Care - HMO

## 2015-04-16 ENCOUNTER — Encounter: Payer: Self-pay | Admitting: Emergency Medicine

## 2015-04-16 DIAGNOSIS — E1122 Type 2 diabetes mellitus with diabetic chronic kidney disease: Secondary | ICD-10-CM | POA: Diagnosis not present

## 2015-04-16 DIAGNOSIS — I472 Ventricular tachycardia: Secondary | ICD-10-CM | POA: Diagnosis present

## 2015-04-16 DIAGNOSIS — I484 Atypical atrial flutter: Secondary | ICD-10-CM | POA: Diagnosis not present

## 2015-04-16 DIAGNOSIS — I4892 Unspecified atrial flutter: Secondary | ICD-10-CM | POA: Diagnosis present

## 2015-04-16 DIAGNOSIS — E785 Hyperlipidemia, unspecified: Secondary | ICD-10-CM | POA: Diagnosis present

## 2015-04-16 DIAGNOSIS — I4891 Unspecified atrial fibrillation: Secondary | ICD-10-CM | POA: Diagnosis present

## 2015-04-16 DIAGNOSIS — Z87891 Personal history of nicotine dependence: Secondary | ICD-10-CM

## 2015-04-16 DIAGNOSIS — Z7901 Long term (current) use of anticoagulants: Secondary | ICD-10-CM | POA: Diagnosis not present

## 2015-04-16 DIAGNOSIS — Z794 Long term (current) use of insulin: Secondary | ICD-10-CM | POA: Diagnosis not present

## 2015-04-16 DIAGNOSIS — R000 Tachycardia, unspecified: Secondary | ICD-10-CM | POA: Diagnosis not present

## 2015-04-16 DIAGNOSIS — I10 Essential (primary) hypertension: Secondary | ICD-10-CM | POA: Diagnosis not present

## 2015-04-16 DIAGNOSIS — N183 Chronic kidney disease, stage 3 (moderate): Secondary | ICD-10-CM | POA: Diagnosis present

## 2015-04-16 DIAGNOSIS — I129 Hypertensive chronic kidney disease with stage 1 through stage 4 chronic kidney disease, or unspecified chronic kidney disease: Secondary | ICD-10-CM | POA: Diagnosis present

## 2015-04-16 DIAGNOSIS — Z833 Family history of diabetes mellitus: Secondary | ICD-10-CM | POA: Diagnosis not present

## 2015-04-16 DIAGNOSIS — R0600 Dyspnea, unspecified: Secondary | ICD-10-CM | POA: Diagnosis not present

## 2015-04-16 DIAGNOSIS — I5032 Chronic diastolic (congestive) heart failure: Secondary | ICD-10-CM | POA: Diagnosis not present

## 2015-04-16 DIAGNOSIS — N289 Disorder of kidney and ureter, unspecified: Secondary | ICD-10-CM

## 2015-04-16 DIAGNOSIS — R531 Weakness: Secondary | ICD-10-CM | POA: Diagnosis not present

## 2015-04-16 DIAGNOSIS — N179 Acute kidney failure, unspecified: Secondary | ICD-10-CM | POA: Diagnosis present

## 2015-04-16 DIAGNOSIS — R001 Bradycardia, unspecified: Secondary | ICD-10-CM | POA: Diagnosis present

## 2015-04-16 DIAGNOSIS — Z8711 Personal history of peptic ulcer disease: Secondary | ICD-10-CM

## 2015-04-16 DIAGNOSIS — Z79899 Other long term (current) drug therapy: Secondary | ICD-10-CM | POA: Diagnosis not present

## 2015-04-16 DIAGNOSIS — E784 Other hyperlipidemia: Secondary | ICD-10-CM | POA: Diagnosis not present

## 2015-04-16 DIAGNOSIS — I5030 Unspecified diastolic (congestive) heart failure: Secondary | ICD-10-CM | POA: Diagnosis not present

## 2015-04-16 DIAGNOSIS — Z8249 Family history of ischemic heart disease and other diseases of the circulatory system: Secondary | ICD-10-CM

## 2015-04-16 DIAGNOSIS — E119 Type 2 diabetes mellitus without complications: Secondary | ICD-10-CM | POA: Diagnosis not present

## 2015-04-16 LAB — COMPREHENSIVE METABOLIC PANEL
ALT: 23 U/L (ref 17–63)
AST: 21 U/L (ref 15–41)
Albumin: 4.1 g/dL (ref 3.5–5.0)
Alkaline Phosphatase: 39 U/L (ref 38–126)
Anion gap: 8 (ref 5–15)
BUN: 29 mg/dL — ABNORMAL HIGH (ref 6–20)
CHLORIDE: 108 mmol/L (ref 101–111)
CO2: 23 mmol/L (ref 22–32)
Calcium: 9.4 mg/dL (ref 8.9–10.3)
Creatinine, Ser: 1.65 mg/dL — ABNORMAL HIGH (ref 0.61–1.24)
GFR calc Af Amer: 47 mL/min — ABNORMAL LOW (ref >60)
GFR calc non Af Amer: 40 mL/min — ABNORMAL LOW (ref >60)
Glucose, Bld: 198 mg/dL — ABNORMAL HIGH (ref 65–99)
Potassium: 4.6 mmol/L (ref 3.5–5.1)
SODIUM: 139 mmol/L (ref 135–145)
TOTAL PROTEIN: 7.8 g/dL (ref 6.5–8.1)
Total Bilirubin: 0.2 mg/dL — ABNORMAL LOW (ref 0.3–1.2)

## 2015-04-16 LAB — CBC WITH DIFFERENTIAL/PLATELET
BASOS ABS: 0 10*3/uL (ref 0–0.1)
Basophils Relative: 1 %
EOS ABS: 0.3 10*3/uL (ref 0–0.7)
Eosinophils Relative: 6 %
HCT: 46.3 % (ref 40.0–52.0)
HEMOGLOBIN: 14.6 g/dL (ref 13.0–18.0)
LYMPHS ABS: 1.6 10*3/uL (ref 1.0–3.6)
LYMPHS PCT: 36 %
MCH: 22.7 pg — AB (ref 26.0–34.0)
MCHC: 31.6 g/dL — ABNORMAL LOW (ref 32.0–36.0)
MCV: 72 fL — AB (ref 80.0–100.0)
Monocytes Absolute: 0.5 10*3/uL (ref 0.2–1.0)
Monocytes Relative: 11 %
NEUTROS PCT: 46 %
Neutro Abs: 2.1 10*3/uL (ref 1.4–6.5)
Platelets: 295 10*3/uL (ref 150–440)
RBC: 6.43 MIL/uL — AB (ref 4.40–5.90)
RDW: 14.8 % — ABNORMAL HIGH (ref 11.5–14.5)
WBC: 4.6 10*3/uL (ref 3.8–10.6)

## 2015-04-16 LAB — TROPONIN I: TROPONIN I: 0.03 ng/mL (ref <0.031)

## 2015-04-16 LAB — MRSA PCR SCREENING: MRSA by PCR: NEGATIVE

## 2015-04-16 LAB — GLUCOSE, CAPILLARY: Glucose-Capillary: 102 mg/dL — ABNORMAL HIGH (ref 65–99)

## 2015-04-16 LAB — PROTIME-INR
INR: 1.66
PROTHROMBIN TIME: 19.8 s — AB (ref 11.4–15.0)

## 2015-04-16 MED ORDER — VALSARTAN-HYDROCHLOROTHIAZIDE 320-25 MG PO TABS: 1.0000 | ORAL_TABLET | Freq: Every day | ORAL | Status: DC

## 2015-04-16 MED ORDER — ACETAMINOPHEN 650 MG RE SUPP: 650.0000 mg | Freq: Four times a day (QID) | RECTAL | Status: DC | PRN

## 2015-04-16 MED ORDER — ADENOSINE 12 MG/4ML IV SOLN
INTRAVENOUS | Status: AC
  Filled 2015-04-16: qty 4

## 2015-04-16 MED ORDER — AMIODARONE LOAD VIA INFUSION
150.0000 mg | Freq: Once | INTRAVENOUS | Status: AC
  Administered 2015-04-16: 150 mg via INTRAVENOUS
  Filled 2015-04-16: qty 83.34

## 2015-04-16 MED ORDER — HYDROCHLOROTHIAZIDE 25 MG PO TABS
25.0000 mg | ORAL_TABLET | Freq: Every day | ORAL | Status: DC
  Administered 2015-04-16 – 2015-04-17 (×2): 25 mg via ORAL
  Filled 2015-04-16 (×2): qty 1

## 2015-04-16 MED ORDER — DEXTROSE 5 % IV SOLN: 60.0000 mg/h | Freq: Once | INTRAVENOUS | Status: DC

## 2015-04-16 MED ORDER — INSULIN ASPART 100 UNIT/ML ~~LOC~~ SOLN: 0.0000 [IU] | Freq: Every day | SUBCUTANEOUS | Status: DC

## 2015-04-16 MED ORDER — AMIODARONE HCL 200 MG PO TABS
400.0000 mg | ORAL_TABLET | Freq: Two times a day (BID) | ORAL | Status: DC
  Administered 2015-04-16: 400 mg via ORAL
  Filled 2015-04-16 (×2): qty 2

## 2015-04-16 MED ORDER — GABAPENTIN 300 MG PO CAPS
300.0000 mg | ORAL_CAPSULE | Freq: Three times a day (TID) | ORAL | Status: DC
  Administered 2015-04-16 – 2015-04-17 (×2): 300 mg via ORAL
  Filled 2015-04-16 (×2): qty 1

## 2015-04-16 MED ORDER — AMIODARONE HCL IN DEXTROSE 360-4.14 MG/200ML-% IV SOLN: 30.0000 mg/h | INTRAVENOUS | Status: DC

## 2015-04-16 MED ORDER — AMIODARONE HCL IN DEXTROSE 360-4.14 MG/200ML-% IV SOLN: 60.0000 mg/h | INTRAVENOUS | Status: DC

## 2015-04-16 MED ORDER — HYDRALAZINE HCL 20 MG/ML IJ SOLN: 10.0000 mg | INTRAMUSCULAR | Status: DC | PRN

## 2015-04-16 MED ORDER — SODIUM CHLORIDE 0.9 % IJ SOLN
3.0000 mL | Freq: Two times a day (BID) | INTRAMUSCULAR | Status: DC
  Administered 2015-04-16 – 2015-04-17 (×2): 3 mL via INTRAVENOUS

## 2015-04-16 MED ORDER — IRBESARTAN 75 MG PO TABS
300.0000 mg | ORAL_TABLET | Freq: Every day | ORAL | Status: DC
  Administered 2015-04-16 – 2015-04-17 (×2): 300 mg via ORAL
  Filled 2015-04-16 (×2): qty 4

## 2015-04-16 MED ORDER — NITROGLYCERIN 0.4 MG SL SUBL: 0.4000 mg | SUBLINGUAL_TABLET | SUBLINGUAL | Status: DC | PRN

## 2015-04-16 MED ORDER — WARFARIN SODIUM 4 MG PO TABS: 5.0000 mg | ORAL_TABLET | Freq: Every day | ORAL | Status: DC

## 2015-04-16 MED ORDER — ADENOSINE 6 MG/2ML IV SOLN
6.0000 mg | Freq: Once | INTRAVENOUS | Status: AC
  Administered 2015-04-16: 6 mg via INTRAVENOUS

## 2015-04-16 MED ORDER — METOPROLOL TARTRATE 1 MG/ML IV SOLN
5.0000 mg | Freq: Once | INTRAVENOUS | Status: AC
  Administered 2015-04-16: 5 mg via INTRAVENOUS

## 2015-04-16 MED ORDER — SODIUM CHLORIDE 0.9 % IV SOLN
INTRAVENOUS | Status: DC
  Administered 2015-04-16: 22:00:00 via INTRAVENOUS

## 2015-04-16 MED ORDER — INSULIN ASPART 100 UNIT/ML ~~LOC~~ SOLN
0.0000 [IU] | Freq: Three times a day (TID) | SUBCUTANEOUS | Status: DC
  Administered 2015-04-17: 1 [IU] via SUBCUTANEOUS
  Administered 2015-04-17: 2 [IU] via SUBCUTANEOUS
  Filled 2015-04-16: qty 2
  Filled 2015-04-16: qty 1

## 2015-04-16 MED ORDER — DOCUSATE SODIUM 100 MG PO CAPS
100.0000 mg | ORAL_CAPSULE | Freq: Two times a day (BID) | ORAL | Status: DC
  Administered 2015-04-16 – 2015-04-17 (×2): 100 mg via ORAL
  Filled 2015-04-16 (×2): qty 1

## 2015-04-16 MED ORDER — ONDANSETRON HCL 4 MG/2ML IJ SOLN: 4.0000 mg | Freq: Four times a day (QID) | INTRAMUSCULAR | Status: DC | PRN

## 2015-04-16 MED ORDER — GLIPIZIDE 5 MG PO TABS
10.0000 mg | ORAL_TABLET | Freq: Two times a day (BID) | ORAL | Status: DC
  Administered 2015-04-17: 10 mg via ORAL
  Filled 2015-04-16: qty 2

## 2015-04-16 MED ORDER — DILTIAZEM HCL ER COATED BEADS 120 MG PO CP24
120.0000 mg | ORAL_CAPSULE | Freq: Every day | ORAL | Status: DC
  Administered 2015-04-16 – 2015-04-17 (×2): 120 mg via ORAL
  Filled 2015-04-16 (×2): qty 1

## 2015-04-16 MED ORDER — METOPROLOL TARTRATE 25 MG PO TABS
25.0000 mg | ORAL_TABLET | Freq: Two times a day (BID) | ORAL | Status: DC
  Administered 2015-04-16 – 2015-04-17 (×2): 25 mg via ORAL
  Filled 2015-04-16 (×2): qty 1

## 2015-04-16 MED ORDER — METOPROLOL TARTRATE 1 MG/ML IV SOLN
INTRAVENOUS | Status: AC
  Filled 2015-04-16: qty 5

## 2015-04-16 MED ORDER — ACETAMINOPHEN 325 MG PO TABS: 650.0000 mg | ORAL_TABLET | Freq: Four times a day (QID) | ORAL | Status: DC | PRN

## 2015-04-16 MED ORDER — SIMVASTATIN 20 MG PO TABS
10.0000 mg | ORAL_TABLET | Freq: Every day | ORAL | Status: DC
Start: 2015-04-16 — End: 2015-04-17
  Administered 2015-04-16: 10 mg via ORAL
  Filled 2015-04-16: qty 1

## 2015-04-16 MED ORDER — ADENOSINE 6 MG/2ML IV SOLN
INTRAVENOUS | Status: AC
  Filled 2015-04-16: qty 2

## 2015-04-16 MED ORDER — ONDANSETRON HCL 4 MG PO TABS: 4.0000 mg | ORAL_TABLET | Freq: Four times a day (QID) | ORAL | Status: DC | PRN

## 2015-04-16 MED ORDER — AMIODARONE HCL IN DEXTROSE 360-4.14 MG/200ML-% IV SOLN
60.0000 mg/h | INTRAVENOUS | Status: DC
  Administered 2015-04-16: 60 mg/h via INTRAVENOUS
  Filled 2015-04-16: qty 200

## 2015-04-16 NOTE — ED Provider Notes (Addendum)
[facility] Emergency Department Provider Note  ____________________________________________  Time seen: 1:50 PM  I have reviewed the triage vital signs and the nursing notes.   HISTORY  Chief Complaint Hypertension and Shortness of Breath     HPI [name] is a 69 y.o. male resistance emergency department in no acute distress and looking well. He reports he is here because of high blood pressure. Upon arrival, he has a pulse of 155. EKG shows he is in atrial flutter.  The patient presents with his wife and daughter. The daughter is helpful with history as well. She reports that he is using energetic person but has been a little bit less energetic over the past 2 days. Occasionally he breaks into a sweat. The patient reports that he is having some mild shortness of breath.  The patient is not aware of his tachycardia. He denies any palpitations. He has had no near syncopal episodes. He does have a cardiac history with an MI a year or 2 ago. The patient reports he did have episodes of bradycardia needed take medication for this at one point. This started last January according to the daughter.    Past Medical History  Diagnosis Date  . Ventricular tachycardia (HCC) 05/20/2014  . Atrial fibrillation and flutter (HCC)   . GI bleeding   . Gastric ulcer   . Diabetes mellitus with complication (HCC)   . Essential hypertension     Patient Active Problem List   Diagnosis Date Noted  . A-fib (HCC) 02/05/2015  . Gastrointestinal bleeding, upper 02/05/2015  . Essential hypertension 05/21/2014  . CKD (chronic kidney disease) stage 3, GFR 30-59 ml/min 05/21/2014  . Dyslipidemia 05/21/2014  . 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 Anemia 05/21/2014  . Ventricular tachycardia (HCC) 05/20/2014  . NSTEMI (non-ST elevated myocardial infarction) (HCC) 05/20/2014    Past Surgical History  Procedure Laterality Date   . Left heart catheterization with coronary angiogram N/A 05/20/2014    Procedure: LEFT HEART CATHETERIZATION WITH CORONARY ANGIOGRAM;  Surgeon: Micheline Chapman, MD;  Location: [facility] CATH LAB;  Service: Cardiovascular;  Laterality: N/A;    Current Outpatient Rx  Name  Route  Sig  Dispense  Refill  . diltiazem (CARDIZEM CD) 120 MG 24 hr capsule   Oral   Take 120 mg by mouth daily.         Marland Kitchen gabapentin (NEURONTIN) 300 MG capsule   Oral   Take 300 mg by mouth 3 (three) times daily.         Marland Kitchen glipiZIDE (GLUCOTROL) 10 MG tablet   Oral   Take 1 tablet (10 mg total) by mouth 2 (two) times daily before a meal.   180 tablet   1   . hydrALAZINE (APRESOLINE) 25 MG tablet   Oral   Take 1 tablet (25 mg total) by mouth 3 (three) times daily.   270 tablet   1   . ibuprofen (ADVIL,MOTRIN) 200 MG tablet   Oral   Take 400 mg by mouth every 6 (six) hours as needed for headache or mild pain.         . metoprolol tartrate (LOPRESSOR) 25 MG tablet   Oral   Take 1 tablet (25 mg total) by mouth 2 (two) times daily.   180 tablet   1   . nitroGLYCERIN (NITROSTAT) 0.4 MG SL tablet   Sublingual   Place 1 tablet (0.4 mg total) under the tongue every 5 (  five) minutes x 3 doses as needed for chest pain.   25 tablet   12   . simvastatin (ZOCOR) 10 MG tablet   Oral   Take 10 mg by mouth at bedtime.         . valsartan-hydrochlorothiazide (DIOVAN-HCT) 320-25 MG tablet   Oral   Take 1 tablet by mouth daily.         Marland Kitchen warfarin (COUMADIN) 5 MG tablet   Oral   Take 1 tablet (5 mg total) by mouth daily. Patient taking differently: Take 5 mg by mouth every evening.    30 tablet   11     Dosing changes will be made at coumadin clinic on  ...     Allergies Review of patient's allergies indicates no known allergies.  No family history on file.  Social History Social History  Substance Use Topics  . Smoking status: Former Smoker    Quit date: 04/28/1964  . Smokeless tobacco: None   . Alcohol Use: No    Review of Systems  Constitutional: Negative for fever. ENT: Negative for sore throat. Cardiovascular: Negative for chest pain. Patient concerned about his hypertension. No palpitations. See history of present illness Respiratory: Negative for cough. Gastrointestinal: Negative for abdominal pain, vomiting and diarrhea. Genitourinary: Negative for dysuria. Musculoskeletal: No myalgias or injuries. Skin: Negative for rash. Notable for diaphoresis. Neurological: Negative for paresthesia or weakness   10-point ROS otherwise negative.  ____________________________________________   PHYSICAL EXAM:  VITAL SIGNS: ED Triage Vitals  Enc Vitals Group     BP 04/16/15 1342 134/92 mmHg     Pulse Rate 04/16/15 1342 155     Resp 04/16/15 1342 20     Temp 04/16/15 1342 97.9 F (36.6 C)     Temp Source 04/16/15 1342 Oral     SpO2 04/16/15 1342 96 %     Weight 04/16/15 1342 204 lb (92.534 kg)     Height 04/16/15 1342  (1.803 m)     Head Cir --      Peak Flow --      Pain Score --      Pain Loc --      Pain Edu? --      Excl. in GC? --     Constitutional: Alert and oriented. Well appearing and in no distress. ENT   Head: Normocephalic and atraumatic.   Nose: No congestion/rhinnorhea.    Cardiovascular: Tachycardic at 155., regular rhythm, no murmur noted Respiratory:  Normal respiratory effort, no tachypnea.    Breath sounds are clear and equal bilaterally.  Gastrointestinal: Soft and nontender. No distention.  Back: No muscle spasm, no tenderness, no CVA tenderness. Musculoskeletal: No deformity noted. Nontender with normal range of motion in all extremities.  No noted edema. Neurologic:  Normal speech and language. No gross focal neurologic deficits are appreciated.  Skin:  Skin is warm, dry. No rash noted. Psychiatric: Mood and affect are normal. Speech and behavior are normal.  ____________________________________________    LABS (pertinent  positives/negatives)  Labs Reviewed  COMPREHENSIVE METABOLIC PANEL - Abnormal; Notable for the following:    Glucose, Bld 198 (*)    BUN 29 (*)    Creatinine, Ser 1.65 (*)    Total Bilirubin 0.2 (*)    GFR calc non Af Amer 40 (*)    GFR calc Af Amer 47 (*)    All other components within normal limits  TROPONIN I  CBC WITH DIFFERENTIAL/PLATELET     ____________________________________________  EKG  ED ECG REPORT I, Daja Shuping W, the attending physician, personally viewed and interpreted this ECG.   Date: 04/16/2015  EKG Time: 1343  Rate: 156  Rhythm: Atrial flutter with 2-1 conduction  Axis: Left axis deviation -34  Intervals: QTC prolonged to 518  ST&T Change: Nonspecific ST-T segment changes.   ____________________________________________    RADIOLOGY  Chest x-ray: Pending  ____________________________________________   PROCEDURES  CRITICAL CARE Performed by: Darien RamusKAMINSKI,Isahi Godwin W   Total critical care time: 4-5 minutes due to the patient's critical condition upon arrival with a heart rate of 155, requiring close monitoring and the use of IV medications and adenosine.  Critical care time was exclusive of separately billable procedures and treating other patients.  Critical care was necessary to treat or prevent imminent or life-threatening deterioration.  Critical care was time spent personally by me on the following activities: development of treatment plan with patient and/or surrogate as well as nursing, discussions with consultants, evaluation of patient's response to treatment, examination of patient, obtaining history from patient or surrogate, ordering and performing treatments and interventions, ordering and review of laboratory studies, ordering and review of radiographic studies, pulse oximetry and re-evaluation of patient's condition.  ____________________________________________   INITIAL IMPRESSION / ASSESSMENT AND PLAN / ED  COURSE  Pertinent labs & imaging results that were available during my care of the patient were reviewed by me and considered in my medical decision making (see chart for details).  Surprisingly well-appearing 69 year old male with a heart rate of 155. It is unclear how long this rate has been going on. He has felt a little weak for 2 days with episodes of diaphoresis. There is a reasonable chance that his heart rate has been elevated for most of this timeframe.  We gave the patient 5 mg of metoprolol IV to attempt to slow the rate down. The rate did not change and we gave him adenosine, 6 mg, fast push, to be sure that this was not an SVT rhythm, to look at the underlying rhythm. 6 mg did give caused to conduction and we could see the atrial flutter underneath. I then called the patient's cardiologist, Dr. Gwen PoundsKowalski, who agreed with the use of amiodarone and admission to the hospital.  ----------------------------------------- 3:05 PM on 04/16/2015 -----------------------------------------  Labs are still pending. I discussed the case with Dr. Elpidio AnisSudini for admission to the hospital. ____________________________________________   FINAL CLINICAL IMPRESSION(S) / ED DIAGNOSES  Final diagnoses:  Atypical atrial flutter [facility](HCC)  Essential hypertension  Tachycardia      Darien Ramusavid W Nickalaus Crooke, MD 04/16/15 1506  Darien Ramusavid W Keeli Roberg, MD 04/16/15 [phone]

## 2015-04-16 NOTE — ED Notes (Signed)
Pt states his bp has been high today, taken multiple times at home and drug store. Occasional SOB. Pt appears in no distress.

## 2015-04-16 NOTE — H&P (Signed)
[facility] code, wife is the healthcare power of attorney full code full codeospital Physicians - [place] at [facility]   PATIENT NAME: [name]    MR#:  [id number]  DATE OF BIRTH:  Jan 11, 1946  DATE OF ADMISSION:  04/16/2015  PRIMARY CARE PHYSICIAN: Brayton El, MD   REQUESTING/REFERRING PHYSICIAN: Dr. Carollee Massed  CHIEF COMPLAINT:   Elevated blood pressure and shortness of breath HISTORY OF PRESENT ILLNESS:  Jawuan Robb  is a 69 y.o. male with a known history of atrial fibrillation, on Coumadin, essential hypertension and diabetic mellitus is presenting to the ED with a chief complaint of elevated blood pressure and some shortness of breath. Patient denies any chest pain but felt dizzy. Denies any loss of consciousness. In the ED upon arrival his pulse was at 155. EKG has revealed atrial flutter. ED physician has discussed with the on-call cardiology Dr. Gwen Pounds who has recommended to start the patient on amiodarone drip. During my examination patient denies any chest pain or chest discomfort. Denies any shortness of breath. Resting comfortably. Asymptomatic. Wife and daughter at bedside. Patient's daughter is reporting that recently patient is started on some medication to increase his heart rate as he was bradycardic before  PAST MEDICAL HISTORY:   Past Medical History  Diagnosis Date  . Ventricular tachycardia (HCC) 05/20/2014  . Atrial fibrillation and flutter (HCC)   . GI bleeding   . Gastric ulcer   . Diabetes mellitus with complication (HCC)   . Essential hypertension     PAST SURGICAL HISTOIRY:   Past Surgical History  Procedure Laterality Date  . Left heart catheterization with coronary angiogram N/A 05/20/2014    Procedure: LEFT HEART CATHETERIZATION WITH CORONARY ANGIOGRAM;  Surgeon: Micheline Chapman, MD;  Location: [facility] CATH LAB;  Service: Cardiovascular;  Laterality: N/A;    SOCIAL HISTORY:   Social History  Substance Use Topics  . Smoking  status: Former Smoker    Quit date: 04/28/1964  . Smokeless tobacco: Not on file  . Alcohol Use: No    FAMILY HISTORY:  Diabetes and hypertension runs in his family  DRUG ALLERGIES:  No Known Allergies  REVIEW OF SYSTEMS:  CONSTITUTIONAL: No fever, fatigue or weakness.  EYES: No blurred or double vision.  EARS, NOSE, AND THROAT: No tinnitus or ear pain.  RESPIRATORY: No cough, shortness of breath, wheezing or hemoptysis.  CARDIOVASCULAR: No chest pain, orthopnea, edema. He felt dizzy GASTROINTESTINAL: No nausea, vomiting, diarrhea or abdominal pain.  GENITOURINARY: No dysuria, hematuria.  ENDOCRINE: No polyuria, nocturia,  HEMATOLOGY: No anemia, easy bruising or bleeding SKIN: No rash or lesion. MUSCULOSKELETAL: No joint pain or arthritis.   NEUROLOGIC: No tingling, numbness, weakness.  PSYCHIATRY: No anxiety or depression.   MEDICATIONS AT HOME:   Prior to Admission medications   Medication Sig Start Date End Date Taking? Authorizing Provider  diltiazem (CARDIZEM CD) 120 MG 24 hr capsule Take 120 mg by mouth daily.   Yes Historical Provider, MD  gabapentin (NEURONTIN) 300 MG capsule Take 300 mg by mouth 3 (three) times daily.   Yes Historical Provider, MD  glipiZIDE (GLUCOTROL) 10 MG tablet Take 1 tablet (10 mg total) by mouth 2 (two) times daily before a meal. 02/05/15  Yes Ellyn Hack, MD  hydrALAZINE (APRESOLINE) 25 MG tablet Take 1 tablet (25 mg total) by mouth 3 (three) times daily. 02/05/15  Yes Ellyn Hack, MD  ibuprofen (ADVIL,MOTRIN) 200 MG tablet Take 400 mg by mouth every 6 (six) hours  as needed for headache or mild pain.   Yes Historical Provider, MD  metoprolol tartrate (LOPRESSOR) 25 MG tablet Take 1 tablet (25 mg total) by mouth 2 (two) times daily. 02/05/15  Yes Ellyn HackSyed Asad A Shah, MD  nitroGLYCERIN (NITROSTAT) 0.4 MG SL tablet Place 1 tablet (0.4 mg total) under the tongue every 5 (five) minutes x 3 doses as needed for chest pain. 05/27/14  Yes Dwana MelenaBryan W  [name], PA-C  simvastatin (ZOCOR) 10 MG tablet Take 10 mg by mouth at bedtime.   Yes Historical Provider, MD  valsartan-hydrochlorothiazide (DIOVAN-HCT) 320-25 MG tablet Take 1 tablet by mouth daily.   Yes Historical Provider, MD  warfarin (COUMADIN) 5 MG tablet Take 1 tablet (5 mg total) by mouth daily. Patient taking differently: Take 5 mg by mouth every evening.  05/27/14  Yes Dwana MelenaBryan W [name], PA-C      VITAL SIGNS:  Blood pressure 127/96, pulse 150, temperature 97.9 F (36.6 C), temperature source Oral, resp. rate 22, height 5\' 11"  (1.803 m), weight 92.534 kg (204 lb), SpO2 97 %.  PHYSICAL EXAMINATION:  GENERAL:  69 y.o.-year-old patient lying in the bed with no acute distress.  EYES: Pupils equal, round, reactive to light and accommodation. No scleral icterus. Extraocular muscles intact.  HEENT: Head atraumatic, normocephalic. Oropharynx and nasopharynx clear.  NECK:  Supple, no jugular venous distention. No thyroid enlargement, no tenderness.  LUNGS: Normal breath sounds bilaterally, no wheezing, rales,rhonchi or crepitation. No use of accessory muscles of respiration.  CARDIOVASCULAR: Tachycardic. No murmurs, rubs, or gallops.  ABDOMEN: Soft, nontender, nondistended. Bowel sounds present. No organomegaly or mass.  EXTREMITIES: No pedal edema, cyanosis, or clubbing.  NEUROLOGIC: Cranial nerves II through XII are intact. Muscle strength 5/5 in all extremities. Sensation intact. Gait not checked.  PSYCHIATRIC: The patient is alert and oriented x 3.  SKIN: No obvious rash, lesion, or ulcer.   LABORATORY PANEL:   CBC  Recent Labs Lab 04/16/15 1359  WBC 4.6  HGB 14.6  HCT 46.3  PLT 295   ------------------------------------------------------------------------------------------------------------------  Chemistries   Recent Labs Lab 04/16/15 1359  NA 139  K 4.6  CL 108  CO2 23  GLUCOSE 198*  BUN 29*  CREATININE 1.65*  CALCIUM 9.4  AST 21  ALT 23  ALKPHOS 39  BILITOT  0.2*   ------------------------------------------------------------------------------------------------------------------  Cardiac Enzymes  Recent Labs Lab 04/16/15 1359  TROPONINI 0.03   ------------------------------------------------------------------------------------------------------------------  RADIOLOGY:  Dg Chest 1 View  04/16/2015  CLINICAL DATA:  Tachycardia.  Weakness. EXAM: CHEST 1 VIEW COMPARISON:  05/21/2014 chest radiograph FINDINGS: Pacer pad overlies the left chest. Stable cardiomediastinal silhouette with mild to moderate cardiomegaly. No pneumothorax. No pleural effusion. Clear lungs, with no focal lung consolidation and no pulmonary edema. IMPRESSION: Stable mild-to-moderate cardiomegaly without pulmonary edema. Lungs appear clear. Electronically Signed   By: Delbert PhenixJason A Poff M.D.   On: 04/16/2015 15:04    EKG:   Orders placed or performed during the hospital encounter of 04/16/15  . EKG 12-Lead  . EKG 12-Lead  . ED EKG  . ED EKG    IMPRESSION AND PLAN:  Judye BosWilliam Mower  is a 69 y.o. male with a known history of atrial fibrillation, on Coumadin, essential hypertension and diabetic mellitus is presenting to the ED with a chief complaint of elevated blood pressure and some shortness of breath. Patient denies any chest pain but felt dizzy. Denies any loss of consciousness. In the ED upon arrival his pulse was at 155. EKG has revealed atrial  flutter. ED physician has discussed with the on-call cardiology Dr. Gwen Pounds who has recommended to start the patient on amiodarone drip. During my examination patient denies any chest pain or chest discomfort. Denies any shortness of breath. Resting comfortably  1. SOB 2/2 Atrial flutter  Pt is started on amiodarone gtt Cycle cardiac biomarkers Cardiac consult is placed to Dr. Gwen Pounds Patient is on Coumadin Will check PT/INR and Coumadin management based on INR results Discontinued hydralazine which is his home medication  which was started recently  2. History of V. Tach Currently patient is on amiodarone drip  3. Acute kidney injury on chronic renal insufficiency Provide IV fluids Avoid nephrotoxins and check a.m. Labs   4. Non-insulin requiring diabetes mellitus Hold home medications Provide sliding scale insulin and get Accu-Cheks  5. Hypertension Continue his home medications except hydralazine   DVT prophylaxis patient is on Coumadin  All the records are reviewed and case discussed with ED provider. Management plans discussed with the patient, family and they are in agreement.  CODE STATUS: Full code, wife is the HCPOA  TOTAL CRITIC TIME TAKING CARE OF THIS PATIENT: 45  minutes.    Ramonita Lab M.D on 04/16/2015 at 3:37 PM  Between 7am to 6pm - Pager - [phone]  After 6pm go to [email or web address] - password [email or web address] [facility]  [place] [facility]  Office  [phone]  CC: Primary care physician; Brayton El, MD

## 2015-04-16 NOTE — Progress Notes (Addendum)
ANTICOAGULATION CONSULT NOTE - Initial Consult  Pharmacy Consult for Warfarin Indication: atrial fibrillation  No Known Allergies  Patient Measurements: Height: 5\' 11"  (180.3 cm) Weight: 208 lb 8.9 oz (94.6 kg) IBW/kg (Calculated) : 75.3 Heparin Dosing Weight:   Vital Signs: Temp: 98.1 F (36.7 C) (10/19 2027) Temp Source: Oral (10/19 2027) BP: 163/102 mmHg (10/19 2027) Pulse Rate: 89 (10/19 2027)  Labs:  Recent Labs  04/16/15 1359  HGB 14.6  HCT 46.3  PLT 295  CREATININE 1.65*  TROPONINI 0.03    Estimated Creatinine Clearance: 48.9 mL/min (by C-G formula based on Cr of 1.65).   Medical History: Past Medical History  Diagnosis Date  . Ventricular tachycardia (HCC) 05/20/2014  . Atrial fibrillation and flutter (HCC)   . GI bleeding   . Gastric ulcer   . Diabetes mellitus with complication (HCC)   . Essential hypertension     Medications:  Prescriptions prior to admission  Medication Sig Dispense Refill Last Dose  . diltiazem (CARDIZEM CD) 120 MG 24 hr capsule Take 120 mg by mouth daily.   04/16/2015 at Unknown time  . gabapentin (NEURONTIN) 300 MG capsule Take 300 mg by mouth 3 (three) times daily.   04/16/2015 at Unknown time  . glipiZIDE (GLUCOTROL) 10 MG tablet Take 1 tablet (10 mg total) by mouth 2 (two) times daily before a meal. 180 tablet 1 04/16/2015 at Unknown time  . hydrALAZINE (APRESOLINE) 25 MG tablet Take 1 tablet (25 mg total) by mouth 3 (three) times daily. 270 tablet 1 04/16/2015 at Unknown time  . ibuprofen (ADVIL,MOTRIN) 200 MG tablet Take 400 mg by mouth every 6 (six) hours as needed for headache or mild pain.   04/15/2015 at 2230  . metoprolol tartrate (LOPRESSOR) 25 MG tablet Take 1 tablet (25 mg total) by mouth 2 (two) times daily. 180 tablet 1 04/16/2015 at 0815  . nitroGLYCERIN (NITROSTAT) 0.4 MG SL tablet Place 1 tablet (0.4 mg total) under the tongue every 5 (five) minutes x 3 doses as needed for chest pain. 25 tablet 12 PRN at PRN  .  simvastatin (ZOCOR) 10 MG tablet Take 10 mg by mouth at bedtime.   04/15/2015 at Unknown time  . valsartan-hydrochlorothiazide (DIOVAN-HCT) 320-25 MG tablet Take 1 tablet by mouth daily.   04/16/2015 at Unknown time  . warfarin (COUMADIN) 5 MG tablet Take 1 tablet (5 mg total) by mouth daily. (Patient taking differently: Take 5 mg by mouth every evening. ) 30 tablet 11 04/15/2015 at 1700    Assessment: Pt was on Warfarin 5 mg PO Q evening at home.  10/19:  INR = 1.66 10/20   INR = 1.93  Goal of Therapy:  INR 2-3   Plan:  Home dose of 5 mg daily. Will reduce to 3 mg daily due to pt being started on amiodarone therapy.   Fulton ReekMatt Alyce Inscore, PharmD, BCPS  04/17/2015

## 2015-04-17 DIAGNOSIS — I484 Atypical atrial flutter: Secondary | ICD-10-CM

## 2015-04-17 DIAGNOSIS — R0600 Dyspnea, unspecified: Secondary | ICD-10-CM

## 2015-04-17 DIAGNOSIS — N289 Disorder of kidney and ureter, unspecified: Secondary | ICD-10-CM

## 2015-04-17 DIAGNOSIS — R001 Bradycardia, unspecified: Secondary | ICD-10-CM

## 2015-04-17 LAB — BASIC METABOLIC PANEL
Anion gap: 6 (ref 5–15)
BUN: 28 mg/dL — ABNORMAL HIGH (ref 6–20)
CHLORIDE: 110 mmol/L (ref 101–111)
CO2: 23 mmol/L (ref 22–32)
CREATININE: 1.51 mg/dL — AB (ref 0.61–1.24)
Calcium: 8.6 mg/dL — ABNORMAL LOW (ref 8.9–10.3)
GFR calc non Af Amer: 45 mL/min — ABNORMAL LOW (ref >60)
GFR, EST AFRICAN AMERICAN: 52 mL/min — AB (ref >60)
Glucose, Bld: 194 mg/dL — ABNORMAL HIGH (ref 65–99)
POTASSIUM: 4.1 mmol/L (ref 3.5–5.1)
Sodium: 139 mmol/L (ref 135–145)

## 2015-04-17 LAB — CBC
HEMATOCRIT: 41.8 % (ref 40.0–52.0)
HEMOGLOBIN: 13.1 g/dL (ref 13.0–18.0)
MCH: 22.5 pg — AB (ref 26.0–34.0)
MCHC: 31.3 g/dL — ABNORMAL LOW (ref 32.0–36.0)
MCV: 71.9 fL — ABNORMAL LOW (ref 80.0–100.0)
Platelets: 216 10*3/uL (ref 150–440)
RBC: 5.81 MIL/uL (ref 4.40–5.90)
RDW: 14.5 % (ref 11.5–14.5)
WBC: 4.1 10*3/uL (ref 3.8–10.6)

## 2015-04-17 LAB — PROTIME-INR
INR: 1.93
Prothrombin Time: 22.2 seconds — ABNORMAL HIGH (ref 11.4–15.0)

## 2015-04-17 LAB — MAGNESIUM: MAGNESIUM: 1.7 mg/dL (ref 1.7–2.4)

## 2015-04-17 LAB — GLUCOSE, CAPILLARY
Glucose-Capillary: 128 mg/dL — ABNORMAL HIGH (ref 65–99)
Glucose-Capillary: 152 mg/dL — ABNORMAL HIGH (ref 65–99)

## 2015-04-17 LAB — TSH: TSH: 0.99 u[IU]/mL (ref 0.350–4.500)

## 2015-04-17 MED ORDER — WARFARIN SODIUM 1 MG PO TABS: 3.0000 mg | ORAL_TABLET | Freq: Every day | ORAL | Status: DC

## 2015-04-17 MED ORDER — WARFARIN SODIUM 1 MG PO TABS
3.0000 mg | ORAL_TABLET | Freq: Once | ORAL | Status: AC
  Administered 2015-04-17: 3 mg via ORAL
  Filled 2015-04-17: qty 3

## 2015-04-17 MED ORDER — AMIODARONE HCL 200 MG PO TABS
400.0000 mg | ORAL_TABLET | Freq: Every day | ORAL | Status: DC
  Administered 2015-04-17: 400 mg via ORAL
  Filled 2015-04-17: qty 2

## 2015-04-17 MED ORDER — AMIODARONE HCL 400 MG PO TABS: 400.0000 mg | ORAL_TABLET | Freq: Every day | ORAL | Status: AC

## 2015-04-17 NOTE — Progress Notes (Signed)
Up and ambulating around unit, no complaints, minimal assist

## 2015-04-17 NOTE — Discharge Summary (Signed)
[facility] [place] [facility]   PATIENT NAME: Jay BosWilliam [name]    MR#:  [id number]  DATE OF BIRTH:  10/13/1945  DATE OF ADMISSION:  04/16/2015 ADMITTING PHYSICIAN: Ramonita LabAruna Gouru, MD  DATE OF DISCHARGE: 04/17/2015  1:36 PM  PRIMARY CARE PHYSICIAN: Brayton ElSyed Shah, MD     ADMISSION DIAGNOSIS:  Tachycardia [R00.0] Atypical atrial flutter (HCC) [I48.4] Essential hypertension [I10]  DISCHARGE DIAGNOSIS:  Active Problems:   Atrial flutter (HCC)   Dyspnea   Atypical atrial flutter (HCC)   Acute renal insufficiency   Sinus bradycardia   SECONDARY DIAGNOSIS:   Past Medical History  Diagnosis Date  . Ventricular tachycardia (HCC) 05/20/2014  . Atrial fibrillation and flutter (HCC)   . GI bleeding   . Gastric ulcer   . Diabetes mellitus with complication (HCC)   . Essential hypertension     .pro HOSPITAL COURSE:   The patient is a 69 year old African-American male with history of V. tach in the past, history of atrial fibrillation and flutter who presents to the hospital with complaints of dyspnea. He was noted to have a heart rate of 150 and he was in atrial flutter. He was apparently recently initiated on hydralazine. Now he is converted into sinus rhythm and remains stable with heart rate of 50s, denies any chest pains. Initial labs revealed elevation of creatinine to 1.65 which is slightly up from baseline, chest x-ray showed no congestive heart failure. Patient was given IV fluids and his kidney function improved to baseline Discussion by problem 1. Atrial flutter a RVR, status post amiodarone, converted into sinus rhythm, patient is to continue amiodarone, Cardizem ,  metoprolol, Coumadin. Patient's INR is 1.93 which is close to therapeutic. urine drug screen was not taken. The patient is to follow-up with Dr. Gwen PoundsKowalski as outpatient in the next few days for further recommendations  2. Dyspnea, likely due to A. flutter RVR, not congestive heart failure,  patient has improved significantly, ambulated, asymptomatic 3. Acute on chronic renal insufficiency, now is close to baseline, follow as outpatient, UA, not taken 4. Hypertension, essential. Seems to be relatively well controlled on current therapy DISCHARGE CONDITIONS:   Stable  CONSULTS OBTAINED:  Treatment Team:  Ramonita LabAruna Gouru, MD Lamar BlinksBruce J Kowalski, MD  DRUG ALLERGIES:  No Known Allergies  DISCHARGE MEDICATIONS:   Discharge Medication List as of 04/17/2015  1:12 PM    START taking these medications   Details  amiodarone (PACERONE) 400 MG tablet Take 1 tablet (400 mg total) by mouth daily., Starting 04/17/2015, Until Discontinued, Normal      CONTINUE these medications which have NOT CHANGED   Details  diltiazem (CARDIZEM CD) 120 MG 24 hr capsule Take 120 mg by mouth daily., Until Discontinued, Historical Med    gabapentin (NEURONTIN) 300 MG capsule Take 300 mg by mouth 3 (three) times daily., Until Discontinued, Historical Med    glipiZIDE (GLUCOTROL) 10 MG tablet Take 1 tablet (10 mg total) by mouth 2 (two) times daily before a meal., Starting 02/05/2015, Until Discontinued, Normal    metoprolol tartrate (LOPRESSOR) 25 MG tablet Take 1 tablet (25 mg total) by mouth 2 (two) times daily., Starting 02/05/2015, Until Discontinued, Normal    nitroGLYCERIN (NITROSTAT) 0.4 MG SL tablet Place 1 tablet (0.4 mg total) under the tongue every 5 (five) minutes x 3 doses as needed for chest pain., Starting 05/27/2014, Until Discontinued, Normal    simvastatin (ZOCOR) 10 MG tablet Take 10 mg by mouth at bedtime., Until Discontinued, Historical  Med    valsartan-hydrochlorothiazide (DIOVAN-HCT) 320-25 MG tablet Take 1 tablet by mouth daily., Until Discontinued, Historical Med    warfarin (COUMADIN) 5 MG tablet Take 1 tablet (5 mg total) by mouth daily., Starting 05/27/2014, Until Discontinued, Normal      STOP taking these medications     hydrALAZINE (APRESOLINE) 25 MG tablet       ibuprofen (ADVIL,MOTRIN) 200 MG tablet          DISCHARGE INSTRUCTIONS:    Patient is to follow-up with Dr. Gwen Pounds in the next few days after discharge  If you experience worsening of your admission symptoms, develop shortness of breath, life threatening emergency, suicidal or homicidal thoughts you must seek medical attention immediately by calling 911 or calling your MD immediately  if symptoms less severe.  You Must read complete instructions/literature along with all the possible adverse reactions/side effects for all the Medicines you take and that have been prescribed to you. Take any new Medicines after you have completely understood and accept all the possible adverse reactions/side effects.   Please note  You were cared for by a hospitalist during your hospital stay. If you have any questions about your discharge medications or the care you received while you were in the hospital after you are discharged, you can call the unit and asked to speak with the hospitalist on call if the hospitalist that took care of you is not available. Once you are discharged, your primary care physician will handle any further medical issues. Please note that NO REFILLS for any discharge medications will be authorized once you are discharged, as it is imperative that you return to your primary care physician (or establish a relationship with a primary care physician if you do not have one) for your aftercare needs so that they can reassess your need for medications and monitor your lab values.    Today   CHIEF COMPLAINT:   Chief Complaint  Patient presents with  . Hypertension  . Shortness of Breath    HISTORY OF PRESENT ILLNESS:  [name]  is a 69 y.o. male with a known history of  V. tach in the past, history of atrial fibrillation and flutter who presents to the hospital with complaints of dyspnea. He was noted to have a heart rate of 150 and he was in atrial flutter. He was  apparently recently initiated on hydralazine. Now he is converted into sinus rhythm and remains stable with heart rate of 50s, denies any chest pains. Initial labs revealed elevation of creatinine to 1.65 which is slightly up from baseline, chest x-ray showed no congestive heart failure. Patient was given IV fluids and his kidney function improved to baseline Discussion by problem 1. Atrial flutter a RVR, status post amiodarone, converted into sinus rhythm, patient is to continue amiodarone, Cardizem ,  metoprolol, Coumadin. Patient's INR is 1.93 which is close to therapeutic. urine drug screen was not taken. The patient is to follow-up with Dr. Gwen Pounds as outpatient in the next few days for further recommendations  2. Dyspnea, likely due to A. flutter RVR, not congestive heart failure, patient has improved significantly, ambulated, asymptomatic 3. Acute on chronic renal insufficiency, now is close to baseline, follow as outpatient, UA, not taken 4. Hypertension, essential. Seems to be relatively well controlled on current therapy   VITAL SIGNS:  Blood pressure 139/70, pulse 46, temperature 97.5 F (36.4 C), temperature source Oral, resp. rate 18, height  (1.803 m), weight 94.6 kg (208 lb  8.9 oz), SpO2 98 %.  I/O:   Intake/Output Summary (Last 24 hours) at 04/17/15 1420 Last data filed at 04/17/15 1300  Gross per 24 hour  Intake 1946.67 ml  Output      0 ml  Net 1946.67 ml    PHYSICAL EXAMINATION:  GENERAL:  69 y.o.-year-old patient lying in the bed with no acute distress.  EYES: Pupils equal, round, reactive to light and accommodation. No scleral icterus. Extraocular muscles intact.  HEENT: Head atraumatic, normocephalic. Oropharynx and nasopharynx clear.  NECK:  Supple, no jugular venous distention. No thyroid enlargement, no tenderness.  LUNGS: Normal breath sounds bilaterally, no wheezing, rales,rhonchi or crepitation. No use of accessory muscles of respiration.   CARDIOVASCULAR: S1, S2 normal. No murmurs, rubs, or gallops.  ABDOMEN: Soft, non-tender, non-distended. Bowel sounds present. No organomegaly or mass.  EXTREMITIES: No pedal edema, cyanosis, or clubbing.  NEUROLOGIC: Cranial nerves II through XII are intact. Muscle strength 5/5 in all extremities. Sensation intact. Gait not checked.  PSYCHIATRIC: The patient is alert and oriented x 3.  SKIN: No obvious rash, lesion, or ulcer.   DATA REVIEW:   CBC  Recent Labs Lab 04/17/15 0032  WBC 4.1  HGB 13.1  HCT 41.8  PLT 216    Chemistries   Recent Labs Lab 04/16/15 1359 04/17/15 0032  NA 139 139  K 4.6 4.1  CL 108 110  CO2 23 23  GLUCOSE 198* 194*  BUN 29* 28*  CREATININE 1.65* 1.51*  CALCIUM 9.4 8.6*  MG  --  1.7  AST 21  --   ALT 23  --   ALKPHOS 39  --   BILITOT 0.2*  --     Cardiac Enzymes  Recent Labs Lab 04/16/15 1359  TROPONINI 0.03    Microbiology Results  Results for orders placed or performed during the hospital encounter of 04/16/15  MRSA PCR Screening     Status: None   Collection Time: 04/16/15  8:18 PM  Result Value Ref Range Status   MRSA by PCR NEGATIVE NEGATIVE Final    Comment:        The GeneXpert MRSA Assay (FDA approved for NASAL specimens only), is one component of a comprehensive MRSA colonization surveillance program. It is not intended to diagnose MRSA infection nor to guide or monitor treatment for MRSA infections.     RADIOLOGY:  Dg Chest 1 View  04/16/2015  CLINICAL DATA:  Tachycardia.  Weakness. EXAM: CHEST 1 VIEW COMPARISON:  05/21/2014 chest radiograph FINDINGS: Pacer pad overlies the left chest. Stable cardiomediastinal silhouette with mild to moderate cardiomegaly. No pneumothorax. No pleural effusion. Clear lungs, with no focal lung consolidation and no pulmonary edema. IMPRESSION: Stable mild-to-moderate cardiomegaly without pulmonary edema. Lungs appear clear. Electronically Signed   By: Delbert Phenix M.D.   On:  04/16/2015 15:04    EKG:   Orders placed or performed during the hospital encounter of 04/16/15  . EKG 12-Lead  . EKG 12-Lead  . ED EKG  . ED EKG      Management plans discussed with the patient, family and they are in agreement.  CODE STATUS:     Code Status Orders        Start     Ordered   04/16/15 2017  Full code   Continuous     04/16/15 2016      TOTAL TIME TAKING CARE OF THIS PATIENT: 40 minutes.  Discussed with Dr. Agnes Lawrence M.D on 04/17/2015 at 2:20 PM  Between 7am to 6pm - Pager - [phone]  After 6pm go to [email or web address] - password [email or web address] [facility]  [place]  Hospitalists  Office  [phone]  CC: Primary care physician; Brayton El, MD

## 2015-04-17 NOTE — Progress Notes (Signed)
[facility] - [place] at [facility]   PATIENT NAME: [name]    MR#:  [id number]  DATE OF BIRTH:  April 12, 1946  SUBJECTIVE:  CHIEF COMPLAINT:   Chief Complaint  Patient presents with  . Hypertension  . Shortness of Breath   patient is a 69 year old African-American male with history of V. tach in the past, history of atrial fibrillation and flutter who presents to the hospital with complaints of dyspnea. He was noted to have a heart rate of 150 and he was in atrial flutter. He was apparently recently initiated on hydralazine. Now he is converted into sinus rhythm and remains stable with heart rate of 50s, denies any chest pains. Initial labs revealed elevation of creatinine to 1.65 which is slightly up from baseline, chest x-ray showed no congestive heart failure. Patient was given IV fluids and his kidney function improved to baseline. He wants to go home. Cardiology recommendations to follow. He is off hydralazine.   Review of Systems  Constitutional: Negative for fever, chills and weight loss.  HENT: Negative for congestion.   Eyes: Negative for blurred vision and double vision.  Respiratory: Negative for cough, sputum production, shortness of breath and wheezing.   Cardiovascular: Negative for chest pain, palpitations, orthopnea, leg swelling and PND.  Gastrointestinal: Negative for nausea, vomiting, abdominal pain, diarrhea, constipation and blood in stool.  Genitourinary: Negative for dysuria, urgency, frequency and hematuria.  Musculoskeletal: Negative for falls.  Neurological: Negative for dizziness, tremors, focal weakness and headaches.  Endo/Heme/Allergies: Does not bruise/bleed easily.  Psychiatric/Behavioral: Negative for depression. The patient does not have insomnia.     VITAL SIGNS: Blood pressure 140/81, pulse 55, temperature 97.5 F (36.4 C), temperature source Oral, resp. rate 19, height  (1.803 m), weight 94.6 kg (208 lb 8.9 oz),  SpO2 99 %.  PHYSICAL EXAMINATION:   GENERAL:  69 y.o.-year-old patient lying in the bed with no acute distress.  EYES: Pupils equal, round, reactive to light and accommodation. No scleral icterus. Extraocular muscles intact.  HEENT: Head atraumatic, normocephalic. Oropharynx and nasopharynx clear.  NECK:  Supple, no jugular venous distention. No thyroid enlargement, no tenderness.  LUNGS: Normal breath sounds bilaterally, no wheezing, rales,rhonchi or crepitation. No use of accessory muscles of respiration.  CARDIOVASCULAR: S1, S2 normal. No murmurs, rubs, or gallops.  ABDOMEN: Soft, nontender, nondistended. Bowel sounds present. No organomegaly or mass.  EXTREMITIES: No pedal edema, cyanosis, or clubbing.  NEUROLOGIC: Cranial nerves II through XII are intact. Muscle strength 5/5 in all extremities. Sensation intact. Gait not checked.  PSYCHIATRIC: The patient is alert and oriented x 3.  SKIN: No obvious rash, lesion, or ulcer.   ORDERS/RESULTS REVIEWED:   CBC  Recent Labs Lab 04/16/15 1359 04/17/15 0032  WBC 4.6 4.1  HGB 14.6 13.1  HCT 46.3 41.8  PLT 295 216  MCV 72.0* 71.9*  MCH 22.7* 22.5*  MCHC 31.6* 31.3*  RDW 14.8* 14.5  LYMPHSABS 1.6  --   MONOABS 0.5  --   EOSABS 0.3  --   BASOSABS 0.0  --    ------------------------------------------------------------------------------------------------------------------  Chemistries   Recent Labs Lab 04/16/15 1359 04/17/15 0032  NA 139 139  K 4.6 4.1  CL 108 110  CO2 23 23  GLUCOSE 198* 194*  BUN 29* 28*  CREATININE 1.65* 1.51*  CALCIUM 9.4 8.6*  MG  --  1.7  AST 21  --   ALT 23  --   ALKPHOS 39  --   BILITOT 0.2*  --    ------------------------------------------------------------------------------------------------------------------  estimated creatinine clearance is 53.4 mL/min (by C-G formula based on Cr of  1.51). ------------------------------------------------------------------------------------------------------------------  Recent Labs  04/17/15 0032  TSH 0.990    Cardiac Enzymes  Recent Labs Lab 04/16/15 1359  TROPONINI 0.03   ------------------------------------------------------------------------------------------------------------------ Invalid input(s): POCBNP ---------------------------------------------------------------------------------------------------------------  RADIOLOGY: Dg Chest 1 View  04/16/2015  CLINICAL DATA:  Tachycardia.  Weakness. EXAM: CHEST 1 VIEW COMPARISON:  05/21/2014 chest radiograph FINDINGS: Pacer pad overlies the left chest. Stable cardiomediastinal silhouette with mild to moderate cardiomegaly. No pneumothorax. No pleural effusion. Clear lungs, with no focal lung consolidation and no pulmonary edema. IMPRESSION: Stable mild-to-moderate cardiomegaly without pulmonary edema. Lungs appear clear. Electronically Signed   By: Delbert PhenixJason A Poff M.D.   On: 04/16/2015 15:04    EKG:  Orders placed or performed during the hospital encounter of 04/16/15  . EKG 12-Lead  . EKG 12-Lead  . ED EKG  . ED EKG    ASSESSMENT AND PLAN:  Active Problems:   Atrial flutter (HCC)   Dyspnea   Atypical atrial flutter (HCC)   Acute renal insufficiency   Sinus bradycardia 1. Atrial flutter a RVR, status post amiodarone, converted into sinus rhythm, patient likely should continue amiodarone. However, due to his bradycardic Heart rate he apparently was recently initiated on hydralazine, so we will await for cardiology's recommendations in regards to amiodarone initiation for this patient. Continue all other medications including Cardizem as well as metoprolol, Coumadin. Patient's INR is 1.93 which is close to therapeutic. Get urine drug screen.  2. Dyspnea, likely due to A. flutter RVR, not congestive heart failure, patient has improved significantly, ambulate him and watch  for symptoms 3. Acute on chronic renal insufficiency, now is close to baseline, follow as outpatient, get UA 4. Hypertension, essential. Seems to be relatively well controlled    Management plans discussed with the patient, family and they are in agreement.   DRUG ALLERGIES: No Known Allergies  CODE STATUS:     Code Status Orders        Start     Ordered   04/16/15 2017  Full code   Continuous     04/16/15 2016      TOTAL TIME TAKING CARE OF THIS PATIENT: 40 minutes.    Katharina CaperVAICKUTE,Rhylee Nunn M.D on 04/17/2015 at 11:20 AM  Between 7am to 6pm - Pager - [phone]  After 6pm go to [email or web address] - password [email or web address] [facility]  [facility]  Office  [phone]  CC: Primary care physician; Brayton ElSyed Shah, MD

## 2015-04-17 NOTE — Progress Notes (Signed)
D/c home per md order, vss, no complaints, instructions given, prescriptions called to pharmacy by MD, iv's d/c, escorted out via wheelchair accompanied by auxillary and family.

## 2015-04-17 NOTE — Consult Note (Signed)
[facility] Consultation [facility]  Patient ID: [name], MRN: [id number], DOB/AGE: 70/01/1946 70 y.o. Admit date: 04/16/2015   Date of Consult: 04/17/2015 Primary Physician: Brayton ElSyed Shah, MD Primary Cardiologist: Gwen PoundsKowalski  Chief Complaint:  Chief Complaint  Patient presents with  . Hypertension  . Shortness of Breath   Reason for Consult: atrial fibrillation with rapid ventricular rate  HPI: 69 y.o. male with known paroxysmal nonvalvular atrial fibrillation hypertension diabetes with complication with chronic kidney disease stage III and known diastolic dysfunction congestive heart failure who is had acute onset of atrial fibrillation with rapid ventricular rate causing weakness fatigue as well as diaphoresis. This also continuing with the significant shortness of breath. The patient had been placed on amiodarone intravenously to convert to normal sinus rhythm for which he converted spontaneously and then had significant bradycardia. This bradycardia now is heart rate of 50 bpm but stable. All of his symptoms have significantly improved and resolved. He has had hypertension control in the past with appropriate medication management including hydralazine and metoprolol. Shortly the patient has had use of angiotensin receptor blocker. The patient also has had stable hyperlipidemia with appropriate medication management including simvastatin. Tinea on warfarin for further risk reduction of stroke for which she has not had any bleeding complications. Currently his been ambulating with no evidence of further symptoms and he is maintaining normal sinus rhythm  Past Medical History  Diagnosis Date  . Ventricular tachycardia (HCC) 05/20/2014  . Atrial fibrillation and flutter (HCC)   . GI bleeding   . Gastric ulcer   . Diabetes mellitus with complication (HCC)   . Essential hypertension       Surgical History:  Past Surgical History  Procedure Laterality Date  . Left heart  catheterization with coronary angiogram N/A 05/20/2014    Procedure: LEFT HEART CATHETERIZATION WITH CORONARY ANGIOGRAM;  Surgeon: Micheline ChapmanMichael D Cooper, MD;  [facility];  Service: Cardiovascular;  Laterality: N/A;     Home Meds: Prior to Admission medications   Medication Sig Start Date End Date Taking? Authorizing Provider  diltiazem (CARDIZEM CD) 120 MG 24 hr capsule Take 120 mg by mouth daily.   Yes Historical Provider, MD  gabapentin (NEURONTIN) 300 MG capsule Take 300 mg by mouth 3 (three) times daily.   Yes Historical Provider, MD  glipiZIDE (GLUCOTROL) 10 MG tablet Take 1 tablet (10 mg total) by mouth 2 (two) times daily before a meal. 02/05/15  Yes Ellyn HackSyed Asad A Shah, MD  hydrALAZINE (APRESOLINE) 25 MG tablet Take 1 tablet (25 mg total) by mouth 3 (three) times daily. 02/05/15  Yes Ellyn HackSyed Asad A Shah, MD  ibuprofen (ADVIL,MOTRIN) 200 MG tablet Take 400 mg by mouth every 6 (six) hours as needed for headache or mild pain.   Yes Historical Provider, MD  metoprolol tartrate (LOPRESSOR) 25 MG tablet Take 1 tablet (25 mg total) by mouth 2 (two) times daily. 02/05/15  Yes Ellyn HackSyed Asad A Shah, MD  nitroGLYCERIN (NITROSTAT) 0.4 MG SL tablet Place 1 tablet (0.4 mg total) under the tongue every 5 (five) minutes x 3 doses as needed for chest pain. 05/27/14  Yes Dwana MelenaBryan W [name], PA-C  simvastatin (ZOCOR) 10 MG tablet Take 10 mg by mouth at bedtime.   Yes Historical Provider, MD  valsartan-hydrochlorothiazide (DIOVAN-HCT) 320-25 MG tablet Take 1 tablet by mouth daily.   Yes Historical Provider, MD  warfarin (COUMADIN) 5 MG tablet Take 1 tablet (5 mg total) by mouth daily. Patient taking differently: Take 5  mg by mouth every evening.  05/27/14  Yes Dwana Melena, PA-C  amiodarone (PACERONE) 400 MG tablet Take 1 tablet (400 mg total) by mouth daily. 04/17/15   Katharina Caper, MD    Inpatient Medications:  . amiodarone  400 mg Oral BID  . amiodarone  400 mg Oral Daily  . diltiazem  120 mg Oral Daily  .  docusate sodium  100 mg Oral BID  . gabapentin  300 mg Oral TID  . glipiZIDE  10 mg Oral BID AC  . irbesartan  300 mg Oral Daily   And  . hydrochlorothiazide  25 mg Oral Daily  . insulin aspart  0-5 Units Subcutaneous QHS  . insulin aspart  0-9 Units Subcutaneous TID WC  . metoprolol tartrate  25 mg Oral BID  . simvastatin  10 mg Oral QHS  . sodium chloride  3 mL Intravenous Q12H  . warfarin  3 mg Oral q1800      Allergies: No Known Allergies  Social History   Social History  . Marital Status: Married    Spouse Name: N/A  . Number of Children: N/A  . Years of Education: N/A   Occupational History  . Not on file.   Social History Main Topics  . Smoking status: Former Smoker    Quit date: 04/28/1964  . Smokeless tobacco: Not on file  . Alcohol Use: No  . Drug Use: Not on file  . Sexual Activity: Not on file   Other Topics Concern  . Not on file   Social History Narrative   Married, lives in [place]. Retired from [employer] work. Nonsmoker, no Etoh.     No family history on file.   Review of Systems Positive for diaphoresis weakness and fatigue Negative for: General:  chills, fever, night sweats or weight changes.  Cardiovascular: PND orthopnea syncope dizziness  Dermatological skin lesions rashes Respiratory: Cough congestion Urologic: Frequent urination urination at night and hematuria Abdominal: negative for nausea, vomiting, diarrhea, bright red blood per rectum, melena, or hematemesis Neurologic: negative for visual changes, and/or hearing changes  All other systems reviewed and are otherwise negative except as noted above.  Labs:  Recent Labs  04/16/15 1359  TROPONINI 0.03   Lab Results  Component Value Date   WBC 4.1 04/17/2015   HGB 13.1 04/17/2015   HCT 41.8 04/17/2015   MCV 71.9* 04/17/2015   PLT 216 04/17/2015    Recent Labs Lab 04/16/15 1359 04/17/15 0032  NA 139 139  K 4.6 4.1  CL 108 110  CO2 23 23  BUN 29* 28*  CREATININE 1.65*  1.51*  CALCIUM 9.4 8.6*  PROT 7.8  --   BILITOT 0.2*  --   ALKPHOS 39  --   ALT 23  --   AST 21  --   GLUCOSE 198* 194*   Lab Results  Component Value Date   CHOL 162 02/05/2015   HDL 49 02/05/2015   LDLCALC 91 02/05/2015   TRIG 111 02/05/2015   No results found for: DDIMER  Radiology/Studies:  Dg Chest 1 View  04/16/2015  CLINICAL DATA:  Tachycardia.  Weakness. EXAM: CHEST 1 VIEW COMPARISON:  05/21/2014 chest radiograph FINDINGS: Pacer pad overlies the left chest. Stable cardiomediastinal silhouette with mild to moderate cardiomegaly. No pneumothorax. No pleural effusion. Clear lungs, with no focal lung consolidation and no pulmonary edema. IMPRESSION: Stable mild-to-moderate cardiomegaly without pulmonary edema. Lungs appear clear. Electronically Signed   By: Delbert Phenix M.D.   On:  04/16/2015 15:04    EKG: Sinus bradycardia with nonspecific ST and T-wave changes  Weights: Filed Weights   04/16/15 1342 04/16/15 2027  Weight: 204 lb (92.534 kg) 208 lb 8.9 oz (94.6 kg)     Physical Exam: Blood pressure 139/92, pulse 48, temperature 97.5 F (36.4 C), temperature source Oral, resp. rate 17, height  (1.803 m), weight 208 lb 8.9 oz (94.6 kg), SpO2 100 %. Body mass index is 29.1 kg/(m^2). General: Well developed, well nourished, in no acute distress. Head eyes ears nose throat: Normocephalic, atraumatic, sclera non-icteric, no xanthomas, nares are without discharge. No apparent thyromegaly and/or mass  Lungs: Normal respiratory effort.  Few wheezes, no rales, no rhonchi.  Heart: RRR with normal S1 S2. no murmur gallop, no rub, PMI is normal size and placement, carotid upstroke normal without bruit, jugular venous pressure is normal Abdomen: Soft, non-tender, non-distended with normoactive bowel sounds. No hepatomegaly. No rebound/guarding. No obvious abdominal masses. Abdominal aorta is normal size without bruit Extremities: Trace edema. no cyanosis, no clubbing, no ulcers   Peripheral : 2+ bilateral upper extremity pulses, 2+ bilateral femoral pulses, 2+ bilateral dorsal pedal pulse Neuro: Alert and oriented. No facial asymmetry. No focal deficit. Moves all extremities spontaneously. Musculoskeletal: Normal muscle tone without kyphosis Psych:  Responds to questions appropriately with a normal affect.    Assessment: 69 year old male with paroxysmal nonvalvular atrial fibrillation with exacerbation and rapid ventricular rate causing diaphoresis weakness and fatigue without evidence of the exacerbation of chronic diastolic dysfunction heart failure or myocardial infarction  Plan: 1. Continue amiodarone for maintenance of normal sinus rhythm at 400 mg 2. Tension control with angiotensin receptor blocker hydralazine and beta blocker 3. Begin ambulation and follow for any further significant symptoms with possible discharged home with follow-up later on next week for adjustments of medication management 4. High intensity cholesterol therapy with simvastatin 5. Anticoagulation with warfarin with a goal INR between 2-3 6. No further cardiac diagnostics necessary at this time  Signed, Lamar Blinks M.D. [facility] 04/17/2015, 12:56 PM

## 2015-04-17 NOTE — Progress Notes (Signed)
ANTICOAGULATION CONSULT NOTE - Initial Consult  Pharmacy Consult for Warfarin Indication: atrial fibrillation  No Known Allergies  Patient Measurements: Height: 5\' 11"  (180.3 cm) Weight: 208 lb 8.9 oz (94.6 kg) IBW/kg (Calculated) : 75.3 Heparin Dosing Weight:   Vital Signs: Temp: 98.3 F (36.8 C) (10/20 0200) Temp Source: Oral (10/19 2027) BP: 119/57 mmHg (10/20 0600) Pulse Rate: 45 (10/20 0600)  Labs:  Recent Labs  04/16/15 1354 04/16/15 1359 04/17/15 0032  HGB  --  14.6 13.1  HCT  --  46.3 41.8  PLT  --  295 216  LABPROT 19.8*  --  22.2*  INR 1.66  --  1.93  CREATININE  --  1.65* 1.51*  TROPONINI  --  0.03  --     Estimated Creatinine Clearance: 53.4 mL/min (by C-G formula based on Cr of 1.51).   Medical History: Past Medical History  Diagnosis Date  . Ventricular tachycardia (HCC) 05/20/2014  . Atrial fibrillation and flutter (HCC)   . GI bleeding   . Gastric ulcer   . Diabetes mellitus with complication (HCC)   . Essential hypertension     Medications:  Prescriptions prior to admission  Medication Sig Dispense Refill Last Dose  . diltiazem (CARDIZEM CD) 120 MG 24 hr capsule Take 120 mg by mouth daily.   04/16/2015 at Unknown time  . gabapentin (NEURONTIN) 300 MG capsule Take 300 mg by mouth 3 (three) times daily.   04/16/2015 at Unknown time  . glipiZIDE (GLUCOTROL) 10 MG tablet Take 1 tablet (10 mg total) by mouth 2 (two) times daily before a meal. 180 tablet 1 04/16/2015 at Unknown time  . hydrALAZINE (APRESOLINE) 25 MG tablet Take 1 tablet (25 mg total) by mouth 3 (three) times daily. 270 tablet 1 04/16/2015 at Unknown time  . ibuprofen (ADVIL,MOTRIN) 200 MG tablet Take 400 mg by mouth every 6 (six) hours as needed for headache or mild pain.   04/15/2015 at 2230  . metoprolol tartrate (LOPRESSOR) 25 MG tablet Take 1 tablet (25 mg total) by mouth 2 (two) times daily. 180 tablet 1 04/16/2015 at 0815  . nitroGLYCERIN (NITROSTAT) 0.4 MG SL tablet Place 1  tablet (0.4 mg total) under the tongue every 5 (five) minutes x 3 doses as needed for chest pain. 25 tablet 12 PRN at PRN  . simvastatin (ZOCOR) 10 MG tablet Take 10 mg by mouth at bedtime.   04/15/2015 at Unknown time  . valsartan-hydrochlorothiazide (DIOVAN-HCT) 320-25 MG tablet Take 1 tablet by mouth daily.   Past Month at Unknown time  . warfarin (COUMADIN) 5 MG tablet Take 1 tablet (5 mg total) by mouth daily. (Patient taking differently: Take 5 mg by mouth every evening. ) 30 tablet 11 04/15/2015 at 1700    Assessment: 69 y/o M admitted with SOB secondary to aflutter with a h/o afib on Coumadin. Patient with subtherapeutic INR on admission on outpatient dosing of 5 mg daily. Patient is starting on amiodarone which may potentiate the affects of warfarin.   10/19 INR: 1.66 No Coumadin given 10/20 INR: 1.93  Goal of Therapy:  INR 2-3   Plan:  Will continue with plan for Coumadin dosing of 3 mg daily reduced due to initiation of amiodarone. Patient did not receive a dose of Coumadin yesterday so will give one dose of Coumadin this AM and another this PM. Will f/u AM labs.   Luisa HartScott Laurens Matheny, PharmD

## 2015-04-18 DIAGNOSIS — I4891 Unspecified atrial fibrillation: Secondary | ICD-10-CM | POA: Diagnosis not present

## 2015-04-21 DIAGNOSIS — I480 Paroxysmal atrial fibrillation: Secondary | ICD-10-CM | POA: Diagnosis not present

## 2015-04-21 DIAGNOSIS — E782 Mixed hyperlipidemia: Secondary | ICD-10-CM | POA: Diagnosis not present

## 2015-04-21 DIAGNOSIS — R001 Bradycardia, unspecified: Secondary | ICD-10-CM | POA: Diagnosis not present

## 2015-04-21 DIAGNOSIS — I5032 Chronic diastolic (congestive) heart failure: Secondary | ICD-10-CM | POA: Diagnosis not present

## 2015-05-09 ENCOUNTER — Encounter: Payer: Self-pay | Admitting: Family Medicine

## 2015-05-09 ENCOUNTER — Ambulatory Visit (INDEPENDENT_AMBULATORY_CARE_PROVIDER_SITE_OTHER): Payer: Medicare HMO | Admitting: Family Medicine

## 2015-05-09 VITALS — BP 142/62 | HR 51 | Temp 97.8°F | Resp 16 | Ht 71.0 in | Wt 212.1 lb

## 2015-05-09 DIAGNOSIS — E1122 Type 2 diabetes mellitus with diabetic chronic kidney disease: Secondary | ICD-10-CM | POA: Diagnosis not present

## 2015-05-09 DIAGNOSIS — E785 Hyperlipidemia, unspecified: Secondary | ICD-10-CM | POA: Diagnosis not present

## 2015-05-09 DIAGNOSIS — I10 Essential (primary) hypertension: Secondary | ICD-10-CM

## 2015-05-09 DIAGNOSIS — N183 Chronic kidney disease, stage 3 unspecified: Secondary | ICD-10-CM

## 2015-05-09 DIAGNOSIS — I480 Paroxysmal atrial fibrillation: Secondary | ICD-10-CM

## 2015-05-09 LAB — POCT GLYCOSYLATED HEMOGLOBIN (HGB A1C): Hemoglobin A1C: 7.6

## 2015-05-09 NOTE — Progress Notes (Signed)
Name: [name]   MRN: [id number]    DOB: 1946-06-17   Date:05/09/2015       Progress Note  Subjective  Chief Complaint  Chief Complaint  Patient presents with  . Hypertension    pt here for 3 month follow up  . Diabetes  . Chronic Kidney Disease  . Atrial Fibrillation    Hypertension This is a chronic problem. The problem is controlled. Pertinent negatives include no blurred vision, chest pain, headaches, palpitations or shortness of breath. Past treatments include angiotensin blockers, diuretics, beta blockers, calcium channel blockers and direct vasodilators. There is no history of CVA or retinopathy.  Diabetes He presents for his follow-up diabetic visit. He has type 2 diabetes mellitus. His disease course has been improving. There are no hypoglycemic associated symptoms. Pertinent negatives for hypoglycemia include no headaches. Associated symptoms include foot paresthesias (Gabapentin not working). Pertinent negatives for diabetes include no blurred vision, no chest pain, no fatigue, no polydipsia and no polyuria. Diabetic complications include peripheral neuropathy. Pertinent negatives for diabetic complications include no CVA, heart disease or retinopathy. Current diabetic treatment includes oral agent (monotherapy). He is compliant with treatment all of the time. His weight is stable. He is following a diabetic diet. He participates in exercise daily. His breakfast blood glucose range is generally 90-110 mg/dl. An ACE inhibitor/angiotensin II receptor blocker is being taken. Eye exam is current.  Hyperlipidemia This is a chronic problem. The problem is controlled. Recent lipid tests were reviewed and are normal. Pertinent negatives include no chest pain, leg pain, myalgias or shortness of breath. Current antihyperlipidemic treatment includes statins. Risk factors for coronary artery disease include dyslipidemia, male sex, hypertension and diabetes mellitus.     Past Medical  History  Diagnosis Date  . Ventricular tachycardia (HCC) 05/20/2014  . Atrial fibrillation and flutter (HCC)   . GI bleeding   . Gastric ulcer   . Diabetes mellitus with complication (HCC)   . Essential hypertension     Past Surgical History  Procedure Laterality Date  . Left heart catheterization with coronary angiogram N/A 05/20/2014    Procedure: LEFT HEART CATHETERIZATION WITH CORONARY ANGIOGRAM;  Surgeon: Micheline Chapman, MD;  Location: [facility] CATH LAB;  Service: Cardiovascular;  Laterality: N/A;    No family history on file.  Social History   Social History  . Marital Status: Married    Spouse Name: N/A  . Number of Children: N/A  . Years of Education: N/A   Occupational History  . Not on file.   Social History Main Topics  . Smoking status: Former Smoker    Quit date: 04/28/1964  . Smokeless tobacco: Not on file  . Alcohol Use: No  . Drug Use: Not on file  . Sexual Activity: Not on file   Other Topics Concern  . Not on file   Social History Narrative   Married, lives in [place]. Retired from [employer] work. Nonsmoker, no Etoh.     Current outpatient prescriptions:  .  hydrALAZINE (APRESOLINE) 25 MG tablet, Take by mouth., Disp: , Rfl:  .  amiodarone (PACERONE) 400 MG tablet, Take 1 tablet (400 mg total) by mouth daily., Disp: 30 tablet, Rfl: 6 .  diltiazem (CARDIZEM CD) 120 MG 24 hr capsule, Take 120 mg by mouth daily., Disp: , Rfl:  .  gabapentin (NEURONTIN) 300 MG capsule, Take 300 mg by mouth 3 (three) times daily., Disp: , Rfl:  .  glipiZIDE (GLUCOTROL) 10 MG tablet, Take 1 tablet (10 mg  total) by mouth 2 (two) times daily before a meal., Disp: 180 tablet, Rfl: 1 .  metoprolol tartrate (LOPRESSOR) 25 MG tablet, Take 1 tablet (25 mg total) by mouth 2 (two) times daily., Disp: 180 tablet, Rfl: 1 .  nitroGLYCERIN (NITROSTAT) 0.4 MG SL tablet, Place 1 tablet (0.4 mg total) under the tongue every 5 (five) minutes x 3 doses as needed for chest pain., Disp: 25  tablet, Rfl: 12 .  simvastatin (ZOCOR) 10 MG tablet, Take 10 mg by mouth at bedtime., Disp: , Rfl:  .  valsartan-hydrochlorothiazide (DIOVAN-HCT) 320-25 MG tablet, Take 1 tablet by mouth daily., Disp: , Rfl:  .  warfarin (COUMADIN) 5 MG tablet, Take 1 tablet (5 mg total) by mouth daily. (Patient taking differently: Take 5 mg by mouth every evening. ), Disp: 30 tablet, Rfl: 11  No Known Allergies   Review of Systems  Constitutional: Negative for fever and fatigue.  Eyes: Negative for blurred vision.  Respiratory: Negative for shortness of breath.   Cardiovascular: Negative for chest pain and palpitations.  Musculoskeletal: Negative for myalgias.  Neurological: Negative for headaches.  Endo/Heme/Allergies: Negative for polydipsia.    Objective  Filed Vitals:   05/09/15 0830  BP: 142/62  Pulse: 51  Temp: 97.8 F (36.6 C)  Resp: 16  Height: 5\' 11"  (1.803 m)  Weight: 212 lb 2 oz (96.219 kg)  SpO2: 98%    Physical Exam  Constitutional: He is oriented to person, place, and time and well-developed, well-nourished, and in no distress.  HENT:  Head: Normocephalic and atraumatic.  Cardiovascular: Normal rate, regular rhythm and normal heart sounds.   No murmur heard. Pulmonary/Chest: Effort normal and breath sounds normal. He has no wheezes.  Abdominal: Soft. Bowel sounds are normal. There is no tenderness.  Neurological: He is alert and oriented to person, place, and time.  Psychiatric: Mood, memory, affect and judgment normal.  Nursing note and vitals reviewed.    Assessment & Plan  1. Type 2 diabetes mellitus with stage 3 chronic kidney disease, without long-term current use of insulin (HCC) A1c improving, no change in therapy. - POCT HgB A1C - Comprehensive Metabolic Panel (CMET) - Urine Microalbumin w/creat. ratio  2. Essential hypertension BP stable on current antihypertensive therapy.  3. Dyslipidemia Stable, continue statin therapy. - Lipid Profile -  Comprehensive Metabolic Panel (CMET)  4. Paroxysmal atrial fibrillation (HCC) Followed by cardiology.     Jakorey Mcconathy Asad A. Faylene KurtzShah [facility] 05/09/2015 8:55 AM

## 2015-05-10 LAB — LIPID PANEL
CHOL/HDL RATIO: 3.4 ratio (ref 0.0–5.0)
Cholesterol, Total: 166 mg/dL (ref 100–199)
HDL: 49 mg/dL (ref >39)
LDL CALC: 94 mg/dL (ref 0–99)
Triglycerides: 114 mg/dL (ref 0–149)
VLDL CHOLESTEROL CAL: 23 mg/dL (ref 5–40)

## 2015-05-10 LAB — COMPREHENSIVE METABOLIC PANEL
A/G RATIO: 1.4 (ref 1.1–2.5)
ALK PHOS: 49 IU/L (ref 39–117)
ALT: 26 IU/L (ref 0–44)
AST: 22 IU/L (ref 0–40)
Albumin: 4.2 g/dL (ref 3.5–4.8)
BILIRUBIN TOTAL: 0.3 mg/dL (ref 0.0–1.2)
BUN / CREAT RATIO: 16 (ref 10–22)
BUN: 26 mg/dL (ref 8–27)
CALCIUM: 9.8 mg/dL (ref 8.6–10.2)
CO2: 23 mmol/L (ref 18–29)
CREATININE: 1.64 mg/dL — AB (ref 0.76–1.27)
Chloride: 101 mmol/L (ref 97–106)
GFR, EST AFRICAN AMERICAN: 48 mL/min/{1.73_m2} — AB (ref >59)
GFR, EST NON AFRICAN AMERICAN: 42 mL/min/{1.73_m2} — AB (ref >59)
Globulin, Total: 3 g/dL (ref 1.5–4.5)
Glucose: 139 mg/dL — ABNORMAL HIGH (ref 65–99)
Potassium: 4.7 mmol/L (ref 3.5–5.2)
SODIUM: 141 mmol/L (ref 136–144)
Total Protein: 7.2 g/dL (ref 6.0–8.5)

## 2015-05-10 LAB — MICROALBUMIN / CREATININE URINE RATIO
Creatinine, Urine: 195.6 mg/dL
MICROALB/CREAT RATIO: 414.1 mg/g creat — ABNORMAL HIGH (ref 0.0–30.0)
MICROALBUM., U, RANDOM: 809.9 ug/mL

## 2015-05-21 DIAGNOSIS — E119 Type 2 diabetes mellitus without complications: Secondary | ICD-10-CM | POA: Diagnosis not present

## 2015-05-21 DIAGNOSIS — R0602 Shortness of breath: Secondary | ICD-10-CM | POA: Diagnosis not present

## 2015-05-21 DIAGNOSIS — I739 Peripheral vascular disease, unspecified: Secondary | ICD-10-CM | POA: Diagnosis not present

## 2015-05-21 DIAGNOSIS — I10 Essential (primary) hypertension: Secondary | ICD-10-CM | POA: Diagnosis not present

## 2015-05-21 DIAGNOSIS — N289 Disorder of kidney and ureter, unspecified: Secondary | ICD-10-CM | POA: Diagnosis not present

## 2015-05-21 DIAGNOSIS — E782 Mixed hyperlipidemia: Secondary | ICD-10-CM | POA: Diagnosis not present

## 2015-05-21 DIAGNOSIS — I5032 Chronic diastolic (congestive) heart failure: Secondary | ICD-10-CM | POA: Diagnosis not present

## 2015-05-21 DIAGNOSIS — I480 Paroxysmal atrial fibrillation: Secondary | ICD-10-CM | POA: Diagnosis not present

## 2015-05-29 ENCOUNTER — Other Ambulatory Visit: Payer: Self-pay | Admitting: Physician Assistant

## 2015-06-10 ENCOUNTER — Telehealth: Payer: Self-pay | Admitting: Family Medicine

## 2015-06-10 NOTE — Telephone Encounter (Signed)
Received a call from [facility] stating that patient is scheduled to see Dr. Wyn Quakerew on Friday, 06/13/15 and the office was in need of a [facility] referral.  I put referral in the [employer] Portal and the authorization # is [id number] for 6 visits starting on 06/13/15.

## 2015-07-01 ENCOUNTER — Other Ambulatory Visit: Payer: Self-pay | Admitting: Physician Assistant

## 2015-07-13 ENCOUNTER — Emergency Department: Payer: Commercial Managed Care - HMO

## 2015-07-13 ENCOUNTER — Emergency Department
Admission: EM | Admit: 2015-07-13 | Discharge: 2015-07-13 | Disposition: A | Discharge status: Home / Self Care | Payer: Commercial Managed Care - HMO | Source: Home / Self Care | Attending: Emergency Medicine | Admitting: Emergency Medicine

## 2015-07-13 DIAGNOSIS — M1712 Unilateral primary osteoarthritis, left knee: Secondary | ICD-10-CM

## 2015-07-13 DIAGNOSIS — R008 Other abnormalities of heart beat: Secondary | ICD-10-CM

## 2015-07-13 DIAGNOSIS — Z79899 Other long term (current) drug therapy: Secondary | ICD-10-CM | POA: Insufficient documentation

## 2015-07-13 DIAGNOSIS — R0989 Other specified symptoms and signs involving the circulatory and respiratory systems: Secondary | ICD-10-CM

## 2015-07-13 DIAGNOSIS — Z87891 Personal history of nicotine dependence: Secondary | ICD-10-CM | POA: Insufficient documentation

## 2015-07-13 DIAGNOSIS — N183 Chronic kidney disease, stage 3 (moderate): Secondary | ICD-10-CM

## 2015-07-13 DIAGNOSIS — R05 Cough: Secondary | ICD-10-CM | POA: Insufficient documentation

## 2015-07-13 DIAGNOSIS — Z7901 Long term (current) use of anticoagulants: Secondary | ICD-10-CM

## 2015-07-13 DIAGNOSIS — R000 Tachycardia, unspecified: Secondary | ICD-10-CM | POA: Diagnosis not present

## 2015-07-13 DIAGNOSIS — I129 Hypertensive chronic kidney disease with stage 1 through stage 4 chronic kidney disease, or unspecified chronic kidney disease: Secondary | ICD-10-CM

## 2015-07-13 DIAGNOSIS — E118 Type 2 diabetes mellitus with unspecified complications: Secondary | ICD-10-CM | POA: Insufficient documentation

## 2015-07-13 DIAGNOSIS — M25562 Pain in left knee: Secondary | ICD-10-CM | POA: Diagnosis not present

## 2015-07-13 LAB — COMPREHENSIVE METABOLIC PANEL
ALT: 34 U/L (ref 17–63)
ANION GAP: 9 (ref 5–15)
AST: 23 U/L (ref 15–41)
Albumin: 3.6 g/dL (ref 3.5–5.0)
Alkaline Phosphatase: 55 U/L (ref 38–126)
BILIRUBIN TOTAL: 0.4 mg/dL (ref 0.3–1.2)
BUN: 29 mg/dL — ABNORMAL HIGH (ref 6–20)
CHLORIDE: 107 mmol/L (ref 101–111)
CO2: 22 mmol/L (ref 22–32)
Calcium: 9 mg/dL (ref 8.9–10.3)
Creatinine, Ser: 1.57 mg/dL — ABNORMAL HIGH (ref 0.61–1.24)
GFR, EST AFRICAN AMERICAN: 49 mL/min — AB (ref >60)
GFR, EST NON AFRICAN AMERICAN: 43 mL/min — AB (ref >60)
Glucose, Bld: 196 mg/dL — ABNORMAL HIGH (ref 65–99)
POTASSIUM: 4.3 mmol/L (ref 3.5–5.1)
Sodium: 138 mmol/L (ref 135–145)
TOTAL PROTEIN: 7.6 g/dL (ref 6.5–8.1)

## 2015-07-13 LAB — CBC WITH DIFFERENTIAL/PLATELET
BASOS ABS: 0.1 10*3/uL (ref 0–0.1)
Basophils Relative: 2 %
EOS PCT: 5 %
Eosinophils Absolute: 0.2 10*3/uL (ref 0–0.7)
HEMATOCRIT: 41.6 % (ref 40.0–52.0)
Hemoglobin: 12.9 g/dL — ABNORMAL LOW (ref 13.0–18.0)
LYMPHS PCT: 21 %
Lymphs Abs: 1 10*3/uL (ref 1.0–3.6)
MCH: 21.9 pg — ABNORMAL LOW (ref 26.0–34.0)
MCHC: 30.9 g/dL — AB (ref 32.0–36.0)
MCV: 70.8 fL — AB (ref 80.0–100.0)
MONO ABS: 0.6 10*3/uL (ref 0.2–1.0)
MONOS PCT: 13 %
NEUTROS ABS: 2.9 10*3/uL (ref 1.4–6.5)
Neutrophils Relative %: 59 %
PLATELETS: 405 10*3/uL (ref 150–440)
RBC: 5.88 MIL/uL (ref 4.40–5.90)
RDW: 14.3 % (ref 11.5–14.5)
WBC: 4.9 10*3/uL (ref 3.8–10.6)

## 2015-07-13 LAB — PROTIME-INR
INR: 3.5
Prothrombin Time: 34.4 seconds — ABNORMAL HIGH (ref 11.4–15.0)

## 2015-07-13 MED ORDER — METOPROLOL TARTRATE 1 MG/ML IV SOLN
5.0000 mg | Freq: Once | INTRAVENOUS | Status: AC
  Administered 2015-07-13: 5 mg via INTRAVENOUS
  Filled 2015-07-13: qty 5

## 2015-07-13 NOTE — ED Notes (Signed)
Report to Angela, RN

## 2015-07-13 NOTE — ED Notes (Signed)
MD at bedside. 

## 2015-07-13 NOTE — ED Notes (Addendum)
Pt reports left knee pain for past for 4 days. Denies injury. Pt heart rate irregular on exam. ECK. States aflutter ? stemi

## 2015-07-13 NOTE — Discharge Instructions (Signed)
Osteoarthritis Osteoarthritis is a disease that causes soreness and inflammation of a joint. It occurs when the cartilage at the affected joint wears down. Cartilage acts as a cushion, covering the ends of bones where they meet to form a joint. Osteoarthritis is the most common form of arthritis. It often occurs in older people. The joints affected most often by this condition include those in the:  Ends of the fingers.  Thumbs.  Neck.  Lower back.  Knees.  Hips. CAUSES  Over time, the cartilage that covers the ends of bones begins to wear away. This causes bone to rub on bone, producing pain and stiffness in the affected joints.  RISK FACTORS Certain factors can increase your chances of having osteoarthritis, including:  Older age.  Excessive body weight.  Overuse of joints.  Previous joint injury. SIGNS AND SYMPTOMS   Pain, swelling, and stiffness in the joint.  Over time, the joint may lose its normal shape.  Small deposits of bone (osteophytes) may grow on the edges of the joint.  Bits of bone or cartilage can break off and float inside the joint space. This may cause more pain and damage. DIAGNOSIS  Your health care provider will do a physical exam and ask about your symptoms. Various tests may be ordered, such as:  X-rays of the affected joint.  Blood tests to rule out other types of arthritis. Additional tests may be used to diagnose your condition. TREATMENT  Goals of treatment are to control pain and improve joint function. Treatment plans may include:  A prescribed exercise program that allows for rest and joint relief.  A weight control plan.  Pain relief techniques, such as:  Properly applied heat and cold.  Electric pulses delivered to nerve endings under the skin (transcutaneous electrical nerve stimulation [TENS]).  Massage.  Certain nutritional supplements.  Medicines to control pain, such as:  Acetaminophen.  Nonsteroidal  anti-inflammatory drugs (NSAIDs), such as naproxen.  Narcotic or central-acting agents, such as tramadol.  Corticosteroids. These can be given orally or as an injection.  Surgery to reposition the bones and relieve pain (osteotomy) or to remove loose pieces of bone and cartilage. Joint replacement may be needed in advanced states of osteoarthritis. HOME CARE INSTRUCTIONS   Take medicines only as directed by your health care provider.  Maintain a healthy weight. Follow your health care provider's instructions for weight control. This may include dietary instructions.  Exercise as directed. Your health care provider can recommend specific types of exercise. These may include:  Strengthening exercises. These are done to strengthen the muscles that support joints affected by arthritis. They can be performed with weights or with exercise bands to add resistance.  Aerobic activities. These are exercises, such as brisk walking or low-impact aerobics, that get your heart pumping.  Range-of-motion activities. These keep your joints limber.  Balance and agility exercises. These help you maintain daily living skills.  Rest your affected joints as directed by your health care provider.  Keep all follow-up visits as directed by your health care provider. SEEK MEDICAL CARE IF:   Your skin turns red.  You develop a rash in addition to your joint pain.  You have worsening joint pain.  You have a fever along with joint or muscle aches. SEEK IMMEDIATE MEDICAL CARE IF:  You have a significant loss of weight or appetite.  You have night sweats. [place] of Arthritis and Musculoskeletal and Skin Diseases: www.niams.SouthExposed.es  National Institute on  Aging: https://walker.com/www.nia.nih.gov  American College of Rheumatology: www.rheumatology.org   This information is not intended to replace advice given to you by your health care provider. Make sure you discuss any questions you  have with your health care provider.   Document Released: 06/14/2005 Document Revised: 07/05/2014 Document Reviewed: 02/19/2013 Elsevier Interactive Patient Education [employer]2016 Elsevier Inc.  Please return immediately if condition worsens. Please contact her primary physician or the physician you were given for referral. If you have any specialist physicians involved in her treatment and plan please also contact them. Thank you for using [facility].

## 2015-07-13 NOTE — ED Notes (Signed)
Left knee pain, tender to touch X 4 days. Worse at night and with movement.

## 2015-07-13 NOTE — ED Notes (Signed)
Patient transported to X-ray 

## 2015-07-13 NOTE — ED Provider Notes (Signed)
Time Seen: Approximately 0 8:15 I have reviewed the triage notes  Chief Complaint: Knee Pain and Irregular Heart Beat   History of Present Illness: [name] is a 70 y.o. male who presents with left-sided knee pain over the last 4 days. He states the knee pain is worse with ambulation and also at night. Went to an area just superior to the left knee. He denies any direct injury to his knee, knee sprain, blood in the joint, fever, or any other concerns. Patient was noted in the triage area to have a fast heart rate and EKG showed atrial flutter. Review of the patient's record and discussion with him at the bedside the atrial flutter is old and he is RA taken his normal daily medications. Patient's on chronic Coumadin. He denies any other joint pain or swelling.   Past Medical History  Diagnosis Date  . Ventricular tachycardia (HCC) 05/20/2014  . Atrial fibrillation and flutter (HCC)   . GI bleeding   . Gastric ulcer   . Diabetes mellitus with complication (HCC)   . Essential hypertension     Patient Active Problem List   Diagnosis Date Noted  . Dyspnea 04/17/2015  . Atypical atrial flutter (HCC) 04/17/2015  . Acute renal insufficiency 04/17/2015  . Sinus bradycardia 04/17/2015  . Atrial flutter (HCC) 04/16/2015  . A-fib (HCC) 02/05/2015  . Gastrointestinal bleeding, upper 02/05/2015  . Essential hypertension 05/21/2014  . CKD (chronic kidney disease) stage 3, GFR 30-59 ml/min 05/21/2014  . Dyslipidemia 05/21/2014  . 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 Anemia 05/21/2014  . Ventricular tachycardia (HCC) 05/20/2014  . NSTEMI (non-ST elevated myocardial infarction) (HCC) 05/20/2014    Past Surgical History  Procedure Laterality Date  . Left heart catheterization with coronary angiogram N/A 05/20/2014    Procedure: LEFT HEART CATHETERIZATION WITH CORONARY ANGIOGRAM;  Surgeon: Micheline ChapmanMichael D Cooper, MD;  [facility] LAB;   Service: Cardiovascular;  Laterality: N/A;    Past Surgical History  Procedure Laterality Date  . Left heart catheterization with coronary angiogram N/A 05/20/2014    Procedure: LEFT HEART CATHETERIZATION WITH CORONARY ANGIOGRAM;  Surgeon: Micheline ChapmanMichael D Cooper, MD;  [facility];  Service: Cardiovascular;  Laterality: N/A;    Current Outpatient Rx  Name  Route  Sig  Dispense  Refill  . amiodarone (PACERONE) 400 MG tablet   Oral   Take 1 tablet (400 mg total) by mouth daily.   30 tablet   6   . diltiazem (CARDIZEM CD) 120 MG 24 hr capsule   Oral   Take 120 mg by mouth daily.         Marland Kitchen. gabapentin (NEURONTIN) 300 MG capsule   Oral   Take 300 mg by mouth 3 (three) times daily.         Marland Kitchen. glipiZIDE (GLUCOTROL) 10 MG tablet   Oral   Take 1 tablet (10 mg total) by mouth 2 (two) times daily before a meal.   180 tablet   1   . hydrALAZINE (APRESOLINE) 25 MG tablet   Oral   Take by mouth.         . metoprolol tartrate (LOPRESSOR) 25 MG tablet   Oral   Take 1 tablet (25 mg total) by mouth 2 (two) times daily.   180 tablet   1   . nitroGLYCERIN (NITROSTAT) 0.4 MG SL tablet   Sublingual   Place 1 tablet (0.4 mg total) under the tongue every  5 (five) minutes x 3 doses as needed for chest pain.   25 tablet   12   . simvastatin (ZOCOR) 10 MG tablet   Oral   Take 10 mg by mouth at bedtime.         . valsartan-hydrochlorothiazide (DIOVAN-HCT) 320-25 MG tablet   Oral   Take 1 tablet by mouth daily.         Marland Kitchen warfarin (COUMADIN) 5 MG tablet      TAKE ONE TABLET BY MOUTH ONCE DAILY   30 tablet   0     Allergies:  Review of patient's allergies indicates no known allergies.  Family History: No family history on file.  Social History: Social History  Substance Use Topics  . Smoking status: Former Smoker    Quit date: 04/28/1964  . Smokeless tobacco: Not on file  . Alcohol Use: No     Review of Systems:   10 point review of systems was performed  and was otherwise negative:  Constitutional: Patient's had some cough and cold symptoms and was seen and evaluated by his primary physician this past week and was diagnosed with viral bronchitis Eyes: No visual disturbances ENT: No sore throat, ear pain Cardiac: No chest pain Respiratory: No shortness of breath, wheezing, or stridor Abdomen: No abdominal pain, no vomiting, No diarrhea Endocrine: No weight loss, No night sweats Extremities: No peripheral edema, cyanosis Skin: No rashes, easy bruising Neurologic: No focal weakness, trouble with speech or swollowing Urologic: No dysuria, Hematuria, or urinary frequency   Physical Exam:  ED Triage Vitals  Enc Vitals Group     BP 07/13/15 0842 109/76 mmHg     Pulse Rate 07/13/15 0842 59     Resp 07/13/15 0842 18     Temp 07/13/15 0842 98.9 F (37.2 C)     Temp Source 07/13/15 0842 Oral     SpO2 07/13/15 0842 100 %     Weight 07/13/15 0842 203 lb (92.08 kg)     Height 07/13/15 0842 5\' 11"  (1.803 m)     Head Cir --      Peak Flow --      Pain Score 07/13/15 0842 9     Pain Loc --      Pain Edu? --      Excl. in GC? --     General: Awake , Alert , and Oriented times 3; GCS 15 Head: Normal cephalic , atraumatic Eyes: Pupils equal , round, reactive to light Nose/Throat: No nasal drainage, patent upper airway without erythema or exudate.  Neck: Supple, Full range of motion, No anterior adenopathy or palpable thyroid masses Lungs: Clear to ascultation without wheezes , rhonchi, or rales Heart: Irregular rate, irregular rhythm without murmurs , gallops , or rubs Abdomen: Soft, non tender without rebound, guarding , or rigidity; bowel sounds positive and symmetric in all 4 quadrants. No organomegaly .        Extremities: Examination of left knee shows tenderness left knee patellar region. There is no joint swelling or effusion. No erythema or warmth. Joint is stable to Lachman and testing also stable to anterior posterior drawer sign  and varus and vagal manipulation Neurologic: normal ambulation, Motor symmetric without deficits, sensory intact Skin: warm, dry, no rashes   Labs:   All laboratory work was reviewed including any pertinent negatives or positives listed below:  Labs Reviewed  PROTIME-INR - Abnormal; Notable for the following:    Prothrombin Time 34.4 (*)    All  other components within normal limits  CBC WITH DIFFERENTIAL/PLATELET - Abnormal; Notable for the following:    Hemoglobin 12.9 (*)    MCV 70.8 (*)    MCH 21.9 (*)    MCHC 30.9 (*)    All other components within normal limits  COMPREHENSIVE METABOLIC PANEL - Abnormal; Notable for the following:    Glucose, Bld 196 (*)    BUN 29 (*)    Creatinine, Ser 1.57 (*)    GFR calc non Af Amer 43 (*)    GFR calc Af Amer 49 (*)    All other components within normal limits   reviewed the patient's laboratory work showed no significant findings that are new for the patient. INR 3.50 EKG:  ED ECG REPORT I, Jennye Moccasin, the attending physician, personally viewed and interpreted this ECG.  Date: 07/13/2015 EKG Time: 0854  Rate: 114 Rhythm: normal sinus rhythm QRS Axis: normal Intervals: normal ST/T Wave abnormalities: normal Conduction Disutrbances: none Narrative Interpretation: unremarkable No acute ischemic changes Atrial flutter is of various conduction between 4:1 to 2:1  Radiology:    EXAM: CHEST 2 VIEW  COMPARISON: 04/16/2015  FINDINGS: Lungs are adequately inflated without consolidation or effusion. Mild stable cardiomegaly. Mild degenerative change of the spine.  IMPRESSION: No acute cardiopulmonary disease.  Mild stable cardiomegaly.   Electronically Signed By: Elberta Fortis M.D. On: 07/13/2015 10:08          DG Knee 2 Views Left (Final result) Result time: 07/13/15 10:10:21   Final result by Rad Results In Interface (07/13/15 10:10:21)   Narrative:   CLINICAL DATA: Left anterior knee pain 4 days.  Tender to touch. No injury.  EXAM: LEFT KNEE - 1-2 VIEW  COMPARISON: None.  FINDINGS: There is no evidence of fracture, dislocation, or joint effusion. There is no evidence of arthropathy or other focal bone abnormality. Soft tissues are unremarkable.  IMPRESSION: Negative.         I personally reviewed the radiologic studies   P ED Course:  Patient's stay here was uneventful and his flutter rate slowed well he was here in emergency department. He was given just a single dose of 5 mg of Lopressor. I felt the slowing of his atrial flutter was most likely due to his normal daily medications. He is asymptomatic with his atrial flutter and I felt it did not require any further investigation as this seems to be a chronic phenomenon for the patient. Knee discomfort seems to be osteoarthritis or osteochondritis with tenderness that seems to be sitting behind the superior part of his patella. Signs of a traumatic or septic joint and there is no joint effusion.   Assessment: Osteoarthritis left knee   Final Clinical Impression:   Final diagnoses:  Osteoarthritis of left knee, unspecified osteoarthritis type     Plan: * Patient will have a left knee immobilizer applied he has been able to bear weight on the joint. He was advised continue with his current medications and return here if he develops a fever or any other concerns. Patient was advised to return immediately if condition worsens. Patient was advised to follow up with their primary care physician or other specialized physicians involved in their outpatient care             Jennye Moccasin, MD 07/13/15 1127

## 2015-07-16 ENCOUNTER — Emergency Department: Payer: Commercial Managed Care - HMO

## 2015-07-16 ENCOUNTER — Inpatient Hospital Stay
Admission: EM | Admit: 2015-07-16 | Discharge: 2015-07-21 | DRG: 309 | Disposition: A | Discharge status: Home Health | Payer: Commercial Managed Care - HMO | Attending: Internal Medicine | Admitting: Internal Medicine

## 2015-07-16 ENCOUNTER — Ambulatory Visit (INDEPENDENT_AMBULATORY_CARE_PROVIDER_SITE_OTHER): Payer: Commercial Managed Care - HMO | Admitting: Family Medicine

## 2015-07-16 ENCOUNTER — Encounter: Payer: Self-pay | Admitting: Family Medicine

## 2015-07-16 ENCOUNTER — Encounter: Payer: Self-pay | Admitting: Emergency Medicine

## 2015-07-16 VITALS — BP 139/76 | HR 155 | Temp 97.9°F | Resp 20 | Ht 71.0 in | Wt 204.8 lb

## 2015-07-16 DIAGNOSIS — I482 Chronic atrial fibrillation, unspecified: Secondary | ICD-10-CM

## 2015-07-16 DIAGNOSIS — R791 Abnormal coagulation profile: Secondary | ICD-10-CM | POA: Diagnosis present

## 2015-07-16 DIAGNOSIS — I10 Essential (primary) hypertension: Secondary | ICD-10-CM

## 2015-07-16 DIAGNOSIS — I493 Ventricular premature depolarization: Secondary | ICD-10-CM | POA: Diagnosis present

## 2015-07-16 DIAGNOSIS — T45515A Adverse effect of anticoagulants, initial encounter: Secondary | ICD-10-CM | POA: Diagnosis present

## 2015-07-16 DIAGNOSIS — Z7901 Long term (current) use of anticoagulants: Secondary | ICD-10-CM

## 2015-07-16 DIAGNOSIS — R002 Palpitations: Secondary | ICD-10-CM | POA: Diagnosis not present

## 2015-07-16 DIAGNOSIS — Z87891 Personal history of nicotine dependence: Secondary | ICD-10-CM | POA: Diagnosis not present

## 2015-07-16 DIAGNOSIS — I480 Paroxysmal atrial fibrillation: Secondary | ICD-10-CM | POA: Diagnosis not present

## 2015-07-16 DIAGNOSIS — I2510 Atherosclerotic heart disease of native coronary artery without angina pectoris: Secondary | ICD-10-CM | POA: Diagnosis present

## 2015-07-16 DIAGNOSIS — D631 Anemia in chronic kidney disease: Secondary | ICD-10-CM | POA: Diagnosis not present

## 2015-07-16 DIAGNOSIS — I252 Old myocardial infarction: Secondary | ICD-10-CM

## 2015-07-16 DIAGNOSIS — Z8249 Family history of ischemic heart disease and other diseases of the circulatory system: Secondary | ICD-10-CM

## 2015-07-16 DIAGNOSIS — I4892 Unspecified atrial flutter: Secondary | ICD-10-CM | POA: Diagnosis not present

## 2015-07-16 DIAGNOSIS — E785 Hyperlipidemia, unspecified: Secondary | ICD-10-CM | POA: Diagnosis present

## 2015-07-16 DIAGNOSIS — I471 Supraventricular tachycardia: Secondary | ICD-10-CM | POA: Diagnosis present

## 2015-07-16 DIAGNOSIS — I248 Other forms of acute ischemic heart disease: Secondary | ICD-10-CM | POA: Diagnosis not present

## 2015-07-16 DIAGNOSIS — E1165 Type 2 diabetes mellitus with hyperglycemia: Secondary | ICD-10-CM | POA: Diagnosis not present

## 2015-07-16 DIAGNOSIS — Z743 Need for continuous supervision: Secondary | ICD-10-CM | POA: Diagnosis not present

## 2015-07-16 DIAGNOSIS — I499 Cardiac arrhythmia, unspecified: Secondary | ICD-10-CM | POA: Diagnosis not present

## 2015-07-16 DIAGNOSIS — Z8711 Personal history of peptic ulcer disease: Secondary | ICD-10-CM

## 2015-07-16 DIAGNOSIS — I5042 Chronic combined systolic (congestive) and diastolic (congestive) heart failure: Secondary | ICD-10-CM | POA: Diagnosis present

## 2015-07-16 DIAGNOSIS — M25562 Pain in left knee: Secondary | ICD-10-CM | POA: Diagnosis not present

## 2015-07-16 DIAGNOSIS — I130 Hypertensive heart and chronic kidney disease with heart failure and stage 1 through stage 4 chronic kidney disease, or unspecified chronic kidney disease: Secondary | ICD-10-CM | POA: Diagnosis present

## 2015-07-16 DIAGNOSIS — N183 Chronic kidney disease, stage 3 (moderate): Secondary | ICD-10-CM | POA: Diagnosis not present

## 2015-07-16 DIAGNOSIS — I4891 Unspecified atrial fibrillation: Secondary | ICD-10-CM | POA: Diagnosis present

## 2015-07-16 DIAGNOSIS — M25462 Effusion, left knee: Secondary | ICD-10-CM | POA: Diagnosis present

## 2015-07-16 DIAGNOSIS — T380X5A Adverse effect of glucocorticoids and synthetic analogues, initial encounter: Secondary | ICD-10-CM | POA: Diagnosis present

## 2015-07-16 DIAGNOSIS — E119 Type 2 diabetes mellitus without complications: Secondary | ICD-10-CM | POA: Diagnosis not present

## 2015-07-16 DIAGNOSIS — IMO0002 Reserved for concepts with insufficient information to code with codable children: Secondary | ICD-10-CM

## 2015-07-16 DIAGNOSIS — R000 Tachycardia, unspecified: Secondary | ICD-10-CM | POA: Diagnosis not present

## 2015-07-16 DIAGNOSIS — E118 Type 2 diabetes mellitus with unspecified complications: Secondary | ICD-10-CM

## 2015-07-16 HISTORY — DX: Heart failure, unspecified: I50.9

## 2015-07-16 HISTORY — DX: Acute myocardial infarction, unspecified: I21.9

## 2015-07-16 LAB — SYNOVIAL CELL COUNT + DIFF, W/ CRYSTALS
CRYSTALS FLUID: NONE SEEN
LYMPHOCYTES-SYNOVIAL FLD: 24 %
Monocyte-Macrophage-Synovial Fluid: 54 %
NEUTROPHIL, SYNOVIAL: 22 %
WBC, SYNOVIAL: 4889 /mm3 — AB (ref 0–200)

## 2015-07-16 LAB — GLUCOSE, CAPILLARY
GLUCOSE-CAPILLARY: 170 mg/dL — AB (ref 65–99)
GLUCOSE-CAPILLARY: 230 mg/dL — AB (ref 65–99)
GLUCOSE-CAPILLARY: 159 mg/dL — AB (ref 65–99)

## 2015-07-16 LAB — CBC
HEMATOCRIT: 34.8 % — AB (ref 40.0–52.0)
Hemoglobin: 10.8 g/dL — ABNORMAL LOW (ref 13.0–18.0)
MCH: 22.2 pg — AB (ref 26.0–34.0)
MCHC: 31 g/dL — ABNORMAL LOW (ref 32.0–36.0)
MCV: 71.8 fL — AB (ref 80.0–100.0)
PLATELETS: 347 10*3/uL (ref 150–440)
RBC: 4.84 MIL/uL (ref 4.40–5.90)
RDW: 14.1 % (ref 11.5–14.5)
WBC: 4.8 10*3/uL (ref 3.8–10.6)

## 2015-07-16 LAB — PROTIME-INR
INR: 3.25
Prothrombin Time: 32.5 seconds — ABNORMAL HIGH (ref 11.4–15.0)

## 2015-07-16 LAB — MRSA PCR SCREENING: MRSA BY PCR: NEGATIVE

## 2015-07-16 LAB — BASIC METABOLIC PANEL
Anion gap: 9 (ref 5–15)
BUN: 35 mg/dL — ABNORMAL HIGH (ref 6–20)
CHLORIDE: 105 mmol/L (ref 101–111)
CO2: 26 mmol/L (ref 22–32)
CREATININE: 1.52 mg/dL — AB (ref 0.61–1.24)
Calcium: 9 mg/dL (ref 8.9–10.3)
GFR calc non Af Amer: 44 mL/min — ABNORMAL LOW (ref >60)
GFR, EST AFRICAN AMERICAN: 51 mL/min — AB (ref >60)
Glucose, Bld: 191 mg/dL — ABNORMAL HIGH (ref 65–99)
POTASSIUM: 3.9 mmol/L (ref 3.5–5.1)
SODIUM: 140 mmol/L (ref 135–145)

## 2015-07-16 LAB — TROPONIN I
TROPONIN I: 0.04 ng/mL — AB (ref <0.031)
Troponin I: 0.03 ng/mL (ref <0.031)

## 2015-07-16 LAB — URIC ACID: Uric Acid, Serum: 10.8 mg/dL — ABNORMAL HIGH (ref 4.4–7.6)

## 2015-07-16 MED ORDER — PREDNISONE 20 MG PO TABS
20.0000 mg | ORAL_TABLET | Freq: Once | ORAL | Status: AC
  Administered 2015-07-16: 20 mg via ORAL
  Filled 2015-07-16: qty 1

## 2015-07-16 MED ORDER — ADENOSINE 12 MG/4ML IV SOLN: 12.0000 mg | Freq: Once | INTRAVENOUS | Status: DC

## 2015-07-16 MED ORDER — GABAPENTIN 300 MG PO CAPS: 300.0000 mg | ORAL_CAPSULE | Freq: Three times a day (TID) | ORAL | Status: DC

## 2015-07-16 MED ORDER — OXYCODONE HCL 5 MG PO TABS
5.0000 mg | ORAL_TABLET | ORAL | Status: DC | PRN
  Administered 2015-07-16 – 2015-07-21 (×4): 5 mg via ORAL
  Filled 2015-07-16 (×5): qty 1

## 2015-07-16 MED ORDER — DILTIAZEM LOAD VIA INFUSION: 5.0000 mg | Freq: Once | INTRAVENOUS | Status: AC

## 2015-07-16 MED ORDER — SODIUM CHLORIDE 0.9 % IJ SOLN
3.0000 mL | Freq: Two times a day (BID) | INTRAMUSCULAR | Status: DC
  Administered 2015-07-16 – 2015-07-18 (×4): 3 mL via INTRAVENOUS

## 2015-07-16 MED ORDER — SIMVASTATIN 10 MG PO TABS
10.0000 mg | ORAL_TABLET | Freq: Every day | ORAL | Status: DC
  Administered 2015-07-16 – 2015-07-20 (×5): 10 mg via ORAL
  Filled 2015-07-16 (×3): qty 1
  Filled 2015-07-16: qty 2
  Filled 2015-07-16 (×2): qty 1

## 2015-07-16 MED ORDER — GLIPIZIDE 5 MG PO TABS
10.0000 mg | ORAL_TABLET | Freq: Two times a day (BID) | ORAL | Status: DC
  Administered 2015-07-16 – 2015-07-21 (×10): 10 mg via ORAL
  Filled 2015-07-16 (×10): qty 2

## 2015-07-16 MED ORDER — DEXTROSE 5 % IV SOLN
5.0000 mg/h | INTRAVENOUS | Status: DC
  Administered 2015-07-16 (×2): 5 mg/h via INTRAVENOUS
  Administered 2015-07-17: 17.5 mg/h via INTRAVENOUS
  Administered 2015-07-17: 12.5 mg/h via INTRAVENOUS
  Administered 2015-07-17: 15 mg/h via INTRAVENOUS
  Administered 2015-07-17: 10 mg/h via INTRAVENOUS
  Administered 2015-07-18 (×2): 15 mg/h via INTRAVENOUS
  Administered 2015-07-19: 10 mg/h via INTRAVENOUS
  Filled 2015-07-16 (×11): qty 100

## 2015-07-16 MED ORDER — INSULIN ASPART 100 UNIT/ML ~~LOC~~ SOLN
0.0000 [IU] | Freq: Three times a day (TID) | SUBCUTANEOUS | Status: DC
  Administered 2015-07-16 – 2015-07-17 (×2): 3 [IU] via SUBCUTANEOUS
  Administered 2015-07-17: 7 [IU] via SUBCUTANEOUS
  Filled 2015-07-16 (×2): qty 3

## 2015-07-16 MED ORDER — METOPROLOL TARTRATE 50 MG PO TABS
50.0000 mg | ORAL_TABLET | Freq: Two times a day (BID) | ORAL | Status: DC
  Administered 2015-07-16 – 2015-07-17 (×3): 50 mg via ORAL
  Filled 2015-07-16 (×3): qty 1

## 2015-07-16 MED ORDER — WARFARIN SODIUM 1 MG PO TABS: 5.0000 mg | ORAL_TABLET | Freq: Every day | ORAL | Status: DC

## 2015-07-16 MED ORDER — INSULIN ASPART 100 UNIT/ML ~~LOC~~ SOLN
0.0000 [IU] | Freq: Every day | SUBCUTANEOUS | Status: DC
  Filled 2015-07-16: qty 7

## 2015-07-16 MED ORDER — ACETAMINOPHEN 650 MG RE SUPP
650.0000 mg | Freq: Four times a day (QID) | RECTAL | Status: DC | PRN
Start: 2015-07-16 — End: 2015-07-21

## 2015-07-16 MED ORDER — DILTIAZEM HCL 25 MG/5ML IV SOLN
5.0000 mg | Freq: Once | INTRAVENOUS | Status: AC
  Administered 2015-07-16: 5 mg via INTRAVENOUS

## 2015-07-16 MED ORDER — METOPROLOL TARTRATE 1 MG/ML IV SOLN
5.0000 mg | Freq: Once | INTRAVENOUS | Status: AC
  Administered 2015-07-16: 5 mg via INTRAVENOUS
  Filled 2015-07-16: qty 5

## 2015-07-16 MED ORDER — ACETAMINOPHEN 325 MG PO TABS
650.0000 mg | ORAL_TABLET | Freq: Four times a day (QID) | ORAL | Status: DC | PRN
Start: 2015-07-16 — End: 2015-07-21

## 2015-07-16 MED ORDER — NITROGLYCERIN 0.4 MG SL SUBL: 0.4000 mg | SUBLINGUAL_TABLET | SUBLINGUAL | Status: DC | PRN

## 2015-07-16 MED ORDER — AMIODARONE HCL 200 MG PO TABS
400.0000 mg | ORAL_TABLET | Freq: Every day | ORAL | Status: DC
  Administered 2015-07-17 – 2015-07-21 (×5): 400 mg via ORAL
  Filled 2015-07-16 (×5): qty 2

## 2015-07-16 MED ORDER — TRIAMCINOLONE ACETONIDE 40 MG/ML IJ SUSP
80.0000 mg | Freq: Once | INTRAMUSCULAR | Status: AC
  Administered 2015-07-16: 80 mg via INTRA_ARTICULAR
  Filled 2015-07-16: qty 2

## 2015-07-16 MED ORDER — ADENOSINE 12 MG/4ML IV SOLN
INTRAVENOUS | Status: AC
  Administered 2015-07-16: 12 mg
  Filled 2015-07-16: qty 4

## 2015-07-16 MED ORDER — BUPIVACAINE HCL (PF) 0.5 % IJ SOLN
10.0000 mL | Freq: Once | INTRAMUSCULAR | Status: AC
  Administered 2015-07-16: 10 mL
  Filled 2015-07-16: qty 30

## 2015-07-16 MED ORDER — DILTIAZEM HCL ER COATED BEADS 120 MG PO CP24
120.0000 mg | ORAL_CAPSULE | Freq: Every day | ORAL | Status: DC
  Administered 2015-07-16 – 2015-07-17 (×2): 120 mg via ORAL
  Filled 2015-07-16 (×3): qty 1

## 2015-07-16 MED ORDER — DILTIAZEM HCL 25 MG/5ML IV SOLN
INTRAVENOUS | Status: AC
  Administered 2015-07-16: 5 mg
  Filled 2015-07-16: qty 5

## 2015-07-16 NOTE — H&P (Signed)
[facility] - [place] at [facility]   PATIENT NAME: [name]    MR#:  [id number]  DATE OF BIRTH:  Feb 19, 1946  DATE OF ADMISSION:  07/16/2015  PRIMARY CARE PHYSICIAN: Brayton El, MD   REQUESTING/REFERRING PHYSICIAN: Dorothea Glassman  CHIEF COMPLAINT:   Chief Complaint  Patient presents with  . Knee Pain  . Atrial Fibrillation    HISTORY OF PRESENT ILLNESS:  [name]  is a 70 y.o. male patient went to his primary care doctor to follow-up for his knee pain has been going on for several days. At his primary care's office they found his heart rate to be up in 150 and they sent him in for further evaluation. The patient feels okay he does not even feel any palpitations no chest pain or shortness of breath. In the ER, he was given a dose of adenosine 6 mg by the ER physician for possible SVT and it slowed down a little bit and it looked like atrial flutter. When I saw him his heart rate was still going at 150 bpm. The patient's major complaint was knee pain left knee. He does not know of any trauma to the knee or any falls. He is having a hard time walking.  PAST MEDICAL HISTORY:   Past Medical History  Diagnosis Date  . Ventricular tachycardia (HCC) 05/20/2014  . Atrial fibrillation and flutter (HCC)   . GI bleeding   . Gastric ulcer   . Diabetes mellitus with complication (HCC)   . Essential hypertension   . Myocardial infarction (HCC)   . CHF (congestive heart failure) (HCC)     PAST SURGICAL HISTORY:   Past Surgical History  Procedure Laterality Date  . Left heart catheterization with coronary angiogram N/A 05/20/2014    Procedure: LEFT HEART CATHETERIZATION WITH CORONARY ANGIOGRAM;  Surgeon: Micheline Chapman, MD;  Location: [facility] CATH LAB;  Service: Cardiovascular;  Laterality: N/A;  . Hernia repair      SOCIAL HISTORY:   Social History  Substance Use Topics  . Smoking status: Former Smoker    Quit date: 04/28/1964  . Smokeless  tobacco: Never Used  . Alcohol Use: No    FAMILY HISTORY:   Family History  Problem Relation Age of Onset  . Congestive Heart Failure Mother     DRUG ALLERGIES:  No Known Allergies  REVIEW OF SYSTEMS:  CONSTITUTIONAL: No fever, fatigue or weakness. Positive for sweats EYES: No blurred or double vision.  EARS, NOSE, AND THROAT: No tinnitus or ear pain. No sore throat. Positive for recent cold. RESPIRATORY: No cough, shortness of breath, wheezing or hemoptysis.  CARDIOVASCULAR: No chest pain, orthopnea, edema.  GASTROINTESTINAL: A few days ago had nausea. No vomiting, diarrhea or abdominal pain. No blood in bowel movements GENITOURINARY: No dysuria, hematuria.  ENDOCRINE: No polyuria, nocturia,  HEMATOLOGY: No anemia, easy bruising or bleeding SKIN: No rash or lesion. MUSCULOSKELETAL: Left knee pain  NEUROLOGIC: No tingling, numbness, weakness.  PSYCHIATRY: No anxiety or depression.   MEDICATIONS AT HOME:   Prior to Admission medications   Medication Sig Start Date End Date Taking? Authorizing Provider  acetaminophen (TYLENOL) 500 MG tablet Take 1,000 mg by mouth every 6 (six) hours as needed for mild pain or headache.   Yes Historical Provider, MD  amiodarone (PACERONE) 400 MG tablet Take 1 tablet (400 mg total) by mouth daily. 04/17/15  Yes Katharina Caper, MD  diltiazem (CARDIZEM CD) 120 MG 24 hr capsule Take 120 mg by mouth  daily.   Yes Historical Provider, MD  glipiZIDE (GLUCOTROL) 10 MG tablet Take 1 tablet (10 mg total) by mouth 2 (two) times daily before a meal. 02/05/15  Yes Ellyn Hack, MD  hydrALAZINE (APRESOLINE) 25 MG tablet Take 50 mg by mouth daily.    Yes Historical Provider, MD  metoprolol tartrate (LOPRESSOR) 25 MG tablet Take 1 tablet (25 mg total) by mouth 2 (two) times daily. 02/05/15  Yes Ellyn Hack, MD  nitroGLYCERIN (NITROSTAT) 0.4 MG SL tablet Place 1 tablet (0.4 mg total) under the tongue every 5 (five) minutes x 3 doses as needed for chest pain.  05/27/14  Yes Dwana Melena, PA-C  simvastatin (ZOCOR) 10 MG tablet Take 10 mg by mouth at bedtime.   Yes Historical Provider, MD  valsartan-hydrochlorothiazide (DIOVAN-HCT) 320-25 MG tablet Take 1 tablet by mouth daily.   Yes Historical Provider, MD  warfarin (COUMADIN) 5 MG tablet Take 2.5-5 mg by mouth daily. Pt takes one tablet Monday-Friday and one-half tablet Saturday and Sunday.   Yes Historical Provider, MD      VITAL SIGNS:  Blood pressure 122/75, pulse 72, temperature 97.5 F (36.4 C), temperature source Oral, resp. rate 20, height  (1.803 m), weight 91 kg (200 lb 9.9 oz), SpO2 100 %.  PHYSICAL EXAMINATION:  GENERAL:  70 y.o.-year-old patient lying in the bed with no acute distress.  EYES: Pupils equal, round, reactive to light and accommodation. No scleral icterus. Extraocular muscles intact.  HEENT: Head atraumatic, normocephalic. Oropharynx and nasopharynx clear.  NECK:  Supple, no jugular venous distention. No thyroid enlargement, no tenderness.  LUNGS: Normal breath sounds bilaterally, no wheezing, rales,rhonchi or crepitation. No use of accessory muscles of respiration.  CARDIOVASCULAR: S1, S2 irregularly irregular tachycardic.Marland Kitchen No murmurs, rubs, or gallops.  ABDOMEN: Soft, nontender, nondistended. Bowel sounds present. No organomegaly or mass.  EXTREMITIES: No pedal edema, cyanosis, or clubbing. Left knee pain in the suprapatellar area. And swelling there. Limited motion with his left knee. NEUROLOGIC: Cranial nerves II through XII are intact. Muscle strength 5/5 in all extremities. Sensation intact. Gait not checked.  PSYCHIATRIC: The patient is alert and oriented x 3.  SKIN: No rash, lesion, or ulcer.   LABORATORY PANEL:   CBC  Recent Labs Lab 07/16/15 1122  WBC 4.8  HGB 10.8*  HCT 34.8*  PLT 347   ------------------------------------------------------------------------------------------------------------------  Chemistries   Recent Labs Lab  07/13/15 0915 07/16/15 1122  NA 138 140  K 4.3 3.9  CL 107 105  CO2 22 26  GLUCOSE 196* 191*  BUN 29* 35*  CREATININE 1.57* 1.52*  CALCIUM 9.0 9.0  AST 23  --   ALT 34  --   ALKPHOS 55  --   BILITOT 0.4  --    ------------------------------------------------------------------------------------------------------------------  Cardiac Enzymes  Recent Labs Lab 07/16/15 1547  TROPONINI 0.03   ------------------------------------------------------------------------------------------------------------------  RADIOLOGY:  Dg Chest Portable 1 View  07/16/2015  CLINICAL DATA:  Tachycardia. EXAM: PORTABLE CHEST 1 VIEW COMPARISON:  July 13, 2015. FINDINGS: The heart size and mediastinal contours are within normal limits. Both lungs are clear. No pneumothorax or pleural effusion is noted. The visualized skeletal structures are unremarkable. IMPRESSION: No acute cardiopulmonary abnormality seen. Electronically Signed   By: Lupita Raider, M.D.   On: 07/16/2015 13:13    EKG:   SVT 150 bpm  IMPRESSION AND PLAN:   1. SVT, rapid atrial fibrillation. I gave 5 mg IV metoprolol which slowed him down a little  bit and I gave oral metoprolol 50 mg twice a day. The ER physician already had him on a Cardizem drip. We will start oral Cardizem and amiodarone that he takes at home. A cardiology consultation. Patient is therapeutic on his Coumadin already. Cardiac enzymes to be sent off. 2. Left knee pain. Seems inflammatory in nature. I will get an orthopedic consultation. Give 20 mg oral prednisone. Check a uric acid. 3. Essential hypertension- hold the patient's other medications besides his rate controlling medications 4. Diabetes type 2- continue oral medications 5. History of congestive heart failure- currently no signs.   All the records are reviewed and case discussed with ED provider. Management plans discussed with the patient, family and they are in agreement.  CODE STATUS: Full  code  TOTAL TIME TAKING CARE OF THIS PATIENT: 50 minutes, patient will be admitted to the CCU stepdown for heart rate control.    Alford Highland M.D on 07/16/2015 at 4:34 PM  Between 7am to 6pm - Pager - [phone]  After 6pm call admission pager [phone]  [facility]  Office  [phone]  CC: Primary care physician; Brayton El, MD

## 2015-07-16 NOTE — Consult Note (Signed)
[facility] Card[facility]  CARDIOLOGY CONSULT NOTE  Patient ID: [name] MRN: [id number] DOB/AGE: 71/25/1947 71 y.o.  Admit date: 07/16/2015 Referring Physician [name] Primary Physician [name] Cardiologist Gwen Pounds Reason for Consultation atrial fibrillation/atrial flutter  HPI: 70 year old gentleman referred for evaluation of atrial fibrillation/atrial flutter. The patient has known history of paroxysmal atrial fibrillation. He was in his usual state of health until day of admission when he presented to [facility] emergency room with worsening left knee pain. The patient was noted to be tachycardic, and atrial fibrillation/atrial flutter, rate has improved on Cardizem drip. Patient denies chest pain or shortness of breath. Troponin is borderline elevated 0.04.  Review of systems complete and found to be negative unless listed above     Past Medical History  Diagnosis Date  . Ventricular tachycardia (HCC) 05/20/2014  . Atrial fibrillation and flutter (HCC)   . GI bleeding   . Gastric ulcer   . Diabetes mellitus with complication (HCC)   . Essential hypertension   . Myocardial infarction (HCC)   . CHF (congestive heart failure) [facility])     Past Surgical History  Procedure Laterality Date  . Left heart catheterization with coronary angiogram N/A 05/20/2014    Procedure: LEFT HEART CATHETERIZATION WITH CORONARY ANGIOGRAM;  Surgeon: Micheline Chapman, MD;  Location: [facility] CATH LAB;  Service: Cardiovascular;  Laterality: N/A;    Prescriptions prior to admission  Medication Sig Dispense Refill Last Dose  . acetaminophen (TYLENOL) 500 MG tablet Take 1,000 mg by mouth every 6 (six) hours as needed for mild pain or headache.   07/15/2015 at 2330  . amiodarone (PACERONE) 400 MG tablet Take 1 tablet (400 mg total) by mouth daily. 30 tablet 6 07/16/2015 at Unknown time  . diltiazem (CARDIZEM CD) 120 MG 24 hr capsule Take 120 mg by mouth daily.   07/16/2015 at Unknown time  . glipiZIDE (GLUCOTROL) 10  MG tablet Take 1 tablet (10 mg total) by mouth 2 (two) times daily before a meal. 180 tablet 1 07/16/2015 at Unknown time  . hydrALAZINE (APRESOLINE) 25 MG tablet Take 50 mg by mouth daily.    07/16/2015 at Unknown time  . metoprolol tartrate (LOPRESSOR) 25 MG tablet Take 1 tablet (25 mg total) by mouth 2 (two) times daily. 180 tablet 1 07/16/2015 at 0800  . nitroGLYCERIN (NITROSTAT) 0.4 MG SL tablet Place 1 tablet (0.4 mg total) under the tongue every 5 (five) minutes x 3 doses as needed for chest pain. 25 tablet 12 PRN at PRN  . simvastatin (ZOCOR) 10 MG tablet Take 10 mg by mouth at bedtime.   07/15/2015 at Unknown time  . valsartan-hydrochlorothiazide (DIOVAN-HCT) 320-25 MG tablet Take 1 tablet by mouth daily.   07/16/2015 at Unknown time  . warfarin (COUMADIN) 5 MG tablet Take 2.5-5 mg by mouth daily. Pt takes one tablet Monday-Friday and one-half tablet Saturday and Sunday.   07/16/2015 at 0800   Social History   Social History  . Marital Status: Married    Spouse Name: N/A  . Number of Children: N/A  . Years of Education: N/A   Occupational History  . Not on file.   Social History Main Topics  . Smoking status: Former Smoker    Quit date: 04/28/1964  . Smokeless tobacco: Never Used  . Alcohol Use: No  . Drug Use: No  . Sexual Activity: Yes   Other Topics Concern  . Not on file   Social History Narrative   Married, lives in [place]. Retired from  [employer] work. Nonsmoker, no Etoh.    History reviewed. No pertinent family history.    Review of systems complete and found to be negative unless listed above      PHYSICAL EXAM  General: Well developed, well nourished, in no acute distress HEENT:  Normocephalic and atramatic Neck:  No JVD.  Lungs: Clear bilaterally to auscultation and percussion. Heart: HRRR . Normal S1 and S2 without gallops or murmurs.  Abdomen: Bowel sounds are positive, abdomen soft and non-tender  Msk:  Back normal, normal gait. Normal strength and tone  for age. Extremities: No clubbing, cyanosis or edema.   Neuro: Alert and oriented X 3. Psych:  Good affect, responds appropriately  Labs:   Lab Results  Component Value Date   WBC 4.8 07/16/2015   HGB 10.8* 07/16/2015   HCT 34.8* 07/16/2015   MCV 71.8* 07/16/2015   PLT 347 07/16/2015    Recent Labs Lab 07/13/15 0915 07/16/15 1122  NA 138 140  K 4.3 3.9  CL 107 105  CO2 22 26  BUN 29* 35*  CREATININE 1.57* 1.52*  CALCIUM 9.0 9.0  PROT 7.6  --   BILITOT 0.4  --   ALKPHOS 55  --   ALT 34  --   AST 23  --   GLUCOSE 196* 191*   Lab Results  Component Value Date   CKTOTAL 232 05/20/2014   CKMB 1.8 05/20/2014   TROPONINI 0.04* 07/16/2015    Lab Results  Component Value Date   CHOL 166 05/09/2015   CHOL 162 02/05/2015   CHOL 128 05/21/2014   Lab Results  Component Value Date   HDL 49 05/09/2015   HDL 49 02/05/2015   HDL 41 05/21/2014   Lab Results  Component Value Date   LDLCALC 94 05/09/2015   LDLCALC 91 02/05/2015   LDLCALC 63 05/21/2014   Lab Results  Component Value Date   TRIG 114 05/09/2015   TRIG 111 02/05/2015   TRIG 121 05/21/2014   Lab Results  Component Value Date   CHOLHDL 3.4 05/09/2015   CHOLHDL 3.3 02/05/2015   CHOLHDL 3.1 05/21/2014   No results found for: LDLDIRECT    Radiology: Dg Chest 2 View  07/13/2015  CLINICAL DATA:  Tachycardia and recent cough. EXAM: CHEST  2 VIEW COMPARISON:  04/16/2015 FINDINGS: Lungs are adequately inflated without consolidation or effusion. Mild stable cardiomegaly. Mild degenerative change of the spine. IMPRESSION: No acute cardiopulmonary disease. Mild stable cardiomegaly. Electronically Signed   By: Elberta Fortis M.D.   On: 07/13/2015 10:08   Dg Knee 2 Views Left  07/13/2015  CLINICAL DATA:  Left anterior knee pain 4 days. Tender to touch. No injury. EXAM: LEFT KNEE - 1-2 VIEW COMPARISON:  None. FINDINGS: There is no evidence of fracture, dislocation, or joint effusion. There is no evidence of  arthropathy or other focal bone abnormality. Soft tissues are unremarkable. IMPRESSION: Negative. Electronically Signed   By: Elberta Fortis M.D.   On: 07/13/2015 10:10   Dg Chest Portable 1 View  07/16/2015  CLINICAL DATA:  Tachycardia. EXAM: PORTABLE CHEST 1 VIEW COMPARISON:  July 13, 2015. FINDINGS: The heart size and mediastinal contours are within normal limits. Both lungs are clear. No pneumothorax or pleural effusion is noted. The visualized skeletal structures are unremarkable. IMPRESSION: No acute cardiopulmonary abnormality seen. Electronically Signed   By: Lupita Raider, M.D.   On: 07/16/2015 13:13    EKG: Atrial flutter at a rate of 75 bpm  ASSESSMENT  AND PLAN:   1. Paroxysmal atrial fibrillation/atrial flutter, with rapid ventricular rate compensatory with knee pain, improved following Cardizem drip 2. Borderline elevated troponin, likely demand supply ischemia secondary to atrial fibrillation with a rapid ventricular rate 3. Left knee pain, steroid injection pending  Recommendations  1. Agree with overall current therapy 2. Continue warfarin for stroke prevention 3. Continue to taper Cardizem drip to off 4. Defer further cardiac diagnostics at this time    Signed: Graciella Arment MD,PhD, [facility] 07/16/2015, 4:27 PM

## 2015-07-16 NOTE — Progress Notes (Signed)
Name: [name]   MRN: [id number]    DOB: [birth year]   Date:07/16/2015       Progress Note  Subjective  Chief Complaint  Chief Complaint  Patient presents with  . Follow-up    ER / knee pain / heart beating fast  . Diabetes  . Hypertension  . Shortness of Breath  . Hyperlipidemia    Knee Pain  There was no injury mechanism. The pain is present in the left knee. The quality of the pain is described as aching. The pain is at a severity of 10/10. The pain is severe. The symptoms are aggravated by movement (bending makes it worse). He has tried acetaminophen for the symptoms. The treatment provided no relief.   Pt. Was seen in the [facility] ED on 07/13/15 for progressive left knee pain. X ray of the knee was negative. He was given a knee immobilizer which does not help relieve the pain. Difficult to walk due to the pain.  No fevers but knee joint feels warm and swollen.  Past Medical History  Diagnosis Date  . Ventricular tachycardia ([place]) 05/20/2014  . Atrial fibrillation and flutter ([place])   . GI bleeding   . Gastric ulcer   . Diabetes mellitus with complication ([place])   . Essential hypertension     Past Surgical History  Procedure Laterality Date  . Left heart catheterization with coronary angiogram N/A 05/20/2014    Procedure: LEFT HEART CATHETERIZATION WITH CORONARY ANGIOGRAM;  Surgeon: Blane Ohara, MD;  Location: [facility] CATH LAB;  Service: Cardiovascular;  Laterality: N/A;    History reviewed. No pertinent family history.  Social History   Social History  . Marital Status: Married    Spouse Name: N/A  . Number of Children: N/A  . Years of Education: N/A   Occupational History  . Not on file.   Social History Main Topics  . Smoking status: Former Smoker    Quit date: 04/28/1964  . Smokeless tobacco: Not on file  . Alcohol Use: No  . Drug Use: Not on file  . Sexual Activity: Not on file   Other Topics Concern  . Not on file   Social History Narrative   Married, lives in [place]. Retired from [employer] work. Nonsmoker, no Etoh.     Current outpatient prescriptions:  .  amiodarone (PACERONE) 400 MG tablet, Take 1 tablet (400 mg total) by mouth daily., Disp: 30 tablet, Rfl: 6 .  diltiazem (CARDIZEM CD) 120 MG 24 hr capsule, Take 120 mg by mouth daily., Disp: , Rfl:  .  gabapentin (NEURONTIN) 300 MG capsule, Take 300 mg by mouth 3 (three) times daily., Disp: , Rfl:  .  glipiZIDE (GLUCOTROL) 10 MG tablet, Take 1 tablet (10 mg total) by mouth 2 (two) times daily before a meal., Disp: 180 tablet, Rfl: 1 .  hydrALAZINE (APRESOLINE) 25 MG tablet, Take by mouth., Disp: , Rfl:  .  metoprolol tartrate (LOPRESSOR) 25 MG tablet, Take 1 tablet (25 mg total) by mouth 2 (two) times daily., Disp: 180 tablet, Rfl: 1 .  nitroGLYCERIN (NITROSTAT) 0.4 MG SL tablet, Place 1 tablet (0.4 mg total) under the tongue every 5 (five) minutes x 3 doses as needed for chest pain., Disp: 25 tablet, Rfl: 12 .  simvastatin (ZOCOR) 10 MG tablet, Take 10 mg by mouth at bedtime., Disp: , Rfl:  .  valsartan-hydrochlorothiazide (DIOVAN-HCT) 320-25 MG tablet, Take 1 tablet by mouth daily., Disp: , Rfl:  .  warfarin (COUMADIN) 5 MG  tablet, TAKE ONE TABLET BY MOUTH ONCE DAILY, Disp: 30 tablet, Rfl: 0  No Known Allergies   Review of Systems  Constitutional: Negative for fever, chills, weight loss and malaise/fatigue.  Musculoskeletal: Positive for joint pain.    Objective  Filed Vitals:   07/16/15 0940  BP: 139/76  Pulse: 155  Temp: 97.9 F (36.6 C)  TempSrc: Oral  Resp: 20  Height: 5' 11"  (1.803 m)  Weight: 204 lb 12.8 oz (92.897 kg)  SpO2: 98%    Physical Exam  Constitutional: He is well-developed, well-nourished, and in no distress.  HENT:  Head: Normocephalic and atraumatic.  Cardiovascular: Regular rhythm.  Tachycardia present.   Pulmonary/Chest: Breath sounds normal.  Musculoskeletal:       Left knee: He exhibits decreased range of motion and swelling.  Tenderness found.       Legs: Marked tenderness to palpation over the supra-patellar part of left knee, feels warm to touch, swollen, limited ROM to pain.  Nursing note and vitals reviewed.    Recent Results (from the past 2160 hour(s))  Glucose, capillary     Status: Abnormal   Collection Time: 04/17/15 12:36 PM  Result Value Ref Range   Glucose-Capillary 152 (H) 65 - 99 mg/dL  POCT HgB A1C     Status: None   Collection Time: 05/09/15  9:00 AM  Result Value Ref Range   Hemoglobin A1C 7.6   Lipid Profile     Status: None   Collection Time: 05/09/15  9:22 AM  Result Value Ref Range   Cholesterol, Total 166 100 - 199 mg/dL   Triglycerides 114 0 - 149 mg/dL   HDL 49 >39 mg/dL    Comment: According to ATP-III Guidelines, HDL-C >59 mg/dL is considered a negative risk factor for CHD.    VLDL Cholesterol Cal 23 5 - 40 mg/dL   LDL Calculated 94 0 - 99 mg/dL   Chol/HDL Ratio 3.4 0.0 - 5.0 ratio units    Comment:                                   T. Chol/HDL Ratio                                             Men  Women                               1/2 Avg.Risk  3.4    3.3                                   Avg.Risk  5.0    4.4                                2X Avg.Risk  9.6    7.1                                3X Avg.Risk 23.4   11.0   Comprehensive Metabolic Panel (CMET)     Status: Abnormal   Collection Time: 05/09/15  9:22 AM  Result Value Ref Range   Glucose 139 (H) 65 - 99 mg/dL   BUN 26 8 - 27 mg/dL   Creatinine, Ser 1.64 (H) 0.76 - 1.27 mg/dL   GFR calc non Af Amer 42 (L) >59 mL/min/1.73   GFR calc Af Amer 48 (L) >59 mL/min/1.73   BUN/Creatinine Ratio 16 10 - 22   Sodium 141 136 - 144 mmol/L   Potassium 4.7 3.5 - 5.2 mmol/L   Chloride 101 97 - 106 mmol/L   CO2 23 18 - 29 mmol/L   Calcium 9.8 8.6 - 10.2 mg/dL   Total Protein 7.2 6.0 - 8.5 g/dL   Albumin 4.2 3.5 - 4.8 g/dL   Globulin, Total 3.0 1.5 - 4.5 g/dL   Albumin/Globulin Ratio 1.4 1.1 - 2.5   Bilirubin Total 0.3  0.0 - 1.2 mg/dL   Alkaline Phosphatase 49 39 - 117 IU/L   AST 22 0 - 40 IU/L   ALT 26 0 - 44 IU/L  Microalbumin / creatinine urine ratio     Status: Abnormal   Collection Time: 05/09/15  9:22 AM  Result Value Ref Range   Creatinine, Urine 195.6 Not Estab. mg/dL   Microalbum.,U,Random 809.9 Not Estab. ug/mL    Comment: Results confirmed on dilution.    MICROALB/CREAT RATIO 414.1 (H) 0.0 - 30.0 mg/g creat  Protime-INR     Status: Abnormal   Collection Time: 07/13/15  9:15 AM  Result Value Ref Range   Prothrombin Time 34.4 (H) 11.4 - 15.0 seconds   INR 3.50   CBC with Differential/Platelet     Status: Abnormal   Collection Time: 07/13/15  9:15 AM  Result Value Ref Range   WBC 4.9 3.8 - 10.6 K/uL   RBC 5.88 4.40 - 5.90 MIL/uL   Hemoglobin 12.9 (L) 13.0 - 18.0 g/dL   HCT 41.6 40.0 - 52.0 %   MCV 70.8 (L) 80.0 - 100.0 fL   MCH 21.9 (L) 26.0 - 34.0 pg   MCHC 30.9 (L) 32.0 - 36.0 g/dL   RDW 14.3 11.5 - 14.5 %   Platelets 405 150 - 440 K/uL   Neutrophils Relative % 59 %   Neutro Abs 2.9 1.4 - 6.5 K/uL   Lymphocytes Relative 21 %   Lymphs Abs 1.0 1.0 - 3.6 K/uL   Monocytes Relative 13 %   Monocytes Absolute 0.6 0.2 - 1.0 K/uL   Eosinophils Relative 5 %   Eosinophils Absolute 0.2 0 - 0.7 K/uL   Basophils Relative 2 %   Basophils Absolute 0.1 0 - 0.1 K/uL  Comprehensive metabolic panel     Status: Abnormal   Collection Time: 07/13/15  9:15 AM  Result Value Ref Range   Sodium 138 135 - 145 mmol/L   Potassium 4.3 3.5 - 5.1 mmol/L   Chloride 107 101 - 111 mmol/L   CO2 22 22 - 32 mmol/L   Glucose, Bld 196 (H) 65 - 99 mg/dL   BUN 29 (H) 6 - 20 mg/dL   Creatinine, Ser 1.57 (H) 0.61 - 1.24 mg/dL   Calcium 9.0 8.9 - 10.3 mg/dL   Total Protein 7.6 6.5 - 8.1 g/dL   Albumin 3.6 3.5 - 5.0 g/dL   AST 23 15 - 41 U/L   ALT 34 17 - 63 U/L   Alkaline Phosphatase 55 38 - 126 U/L   Total Bilirubin 0.4 0.3 - 1.2 mg/dL   GFR calc non Af Amer 43 (L) >60 mL/min   GFR calc Af Wyvonnia Lora  49 (L) >60  mL/min    Comment: (NOTE) The eGFR has been calculated using the CKD EPI equation. This calculation has not been validated in all clinical situations. eGFR's persistently <60 mL/min signify possible Chronic Kidney Disease.    Anion gap 9 5 - 15     Assessment & Plan  1. Pain and swelling of left knee Rule out potential etiologies including  Inflammatory, degenerative, infectious (less likely). X-ray of left knee was unremarkable in the ER on 07/13/2015. We will order an MRI and orthopedic consultation - CBC with Differential - Uric acid - Sed Rate (ESR) - Comprehensive Metabolic Panel (CMET) - MR Knee Left  Wo Contrast; Future - Ambulatory referral to Orthopedic Surgery  2. Atrial flutter, unspecified type ([place]) Upon auscultation, patient was found to be tachycardic. He was found to be in atrial flutter in the ER on 07/13/2015. We obtained an EKG which showed atrial flutter with heart rate of 155BPM. Patient denies any chest pain, dyspnea, palpitations. We will send to ER for management of atrial tachycardia. Case discussed with charge nurse at the ER. Patient and wife in agreement. - EKG 12-Lead   Leonides Minder [facility] 07/16/2015 9:55 AM

## 2015-07-16 NOTE — ED Provider Notes (Signed)
[facility] Emergency Department Provider Note  ____________________________________________  Time seen: Approximately 12:27 PM  I have reviewed the triage vital signs and the nursing notes.   HISTORY  Chief Complaint Knee Pain and Atrial Fibrillation    HPI [name] is a 70 y.o. male who went to the doctor today to follow-up for his knee pain that he's had for a week. The doctor took his pulse and found he was tachycardic and sent him to the emergency room. Patient has a history of MI a flutter and A. fib. Patient is feeling fine except for his knee. Nothing hurts she is not short of breath he has no tightness anywhere and he is taking all his medicines. Patient was unable to complete a vagal maneuver therefore we gave him adenosine 6 IV this produced 2 or 3 PVCs and showed atrial flutter. Patient was given diltiazem 5 repeat dose of 500 on a drip. Patient's heart rate is beginning to slow.   Past Medical History  Diagnosis Date  . Ventricular tachycardia (HCC) 05/20/2014  . Atrial fibrillation and flutter (H[facility])   . GI bleeding   . Gastric ulcer   . Diabetes mellitus with complication (HCC)   . Essential hypertension   . Myocardial infarction (H[facility])   . CHF (congestive heart failure) [facility])     Patient Active Problem List   Diagnosis Date Noted  . Pain and swelling of left knee 07/16/2015  . Atrial fibrillation (HCC) 07/16/2015  . Dyspnea 04/17/2015  . Atypical atrial flutter (HCC) 04/17/2015  . Acute renal insufficiency 04/17/2015  . Sinus bradycardia 04/17/2015  . Atrial flutter (HCC) 04/16/2015  . A-fib (HCC) 02/05/2015  . Gastrointestinal bleeding, upper 02/05/2015  . Essential hypertension 05/21/2014  . CKD (chronic kidney disease) stage 3, GFR 30-59 ml/min 05/21/2014  . Dyslipidemia 05/21/2014  . 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 Anemia 05/21/2014  . Ventricular tachycardia (HCC)  05/20/2014  . NSTEMI (non-ST elevated myocardial infarction) (HCC) 05/20/2014    Past Surgical History  Procedure Laterality Date  . Left heart catheterization with coronary angiogram N/A 05/20/2014    Procedure: LEFT HEART CATHETERIZATION WITH CORONARY ANGIOGRAM;  Surgeon: Micheline Chapman, MD;  Location: [facility] CATH LAB;  Service: Cardiovascular;  Laterality: N/A;    No current outpatient prescriptions on file.  Allergies Review of patient's allergies indicates no known allergies.  History reviewed. No pertinent family history.  Social History Social History  Substance Use Topics  . Smoking status: Former Smoker    Quit date: 04/28/1964  . Smokeless tobacco: Never Used  . Alcohol Use: No    Review of Systems Constitutional: No fever/chills Eyes: No visual changes. ENT: No sore throat. Cardiovascular: Denies chest pain. Respiratory: Denies shortness of breath. Gastrointestinal: No abdominal pain.  No nausea, no vomiting.  No diarrhea.  No constipation. Genitourinary: Negative for dysuria. Musculoskeletal: Negative for back pain. Skin: Negative for rash. Neurological: Negative for headaches, focal weakness or numbness.  10-point ROS otherwise negative.  ____________________________________________   PHYSICAL EXAM:  VITAL SIGNS: ED Triage Vitals  Enc Vitals Group     BP 07/16/15 1116 128/93 mmHg     Pulse Rate 07/16/15 1116 155     Resp 07/16/15 1116 16     Temp 07/16/15 1116 97.9 F (36.6 C)     Temp Source 07/16/15 1116 Oral     SpO2 07/16/15 1116 99 %     Weight 07/16/15 1116 204 lb (92.534 kg)  Height 07/16/15 1116  (1.803 m)     Head Cir --      Peak Flow --      Pain Score 07/16/15 1117 10     Pain Loc --      Pain Edu? --      Excl. in GC? --     Constitutional: Alert and oriented. Well appearing and in no acute distress. Eyes: Conjunctivae are normal. PERRL. EOMI. Head: Atraumatic. Nose: No congestion/rhinnorhea. Mouth/Throat: Mucous  membranes are moist.  Oropharynx non-erythematous. Neck: No stridor. Cardiovascular: Tachycardia, regular rhythm. Grossly normal heart sounds.  Good peripheral circulation. Respiratory: Normal respiratory effort.  No retractions. Lungs CTAB. Gastrointestinal: Soft and nontender. No distention. No abdominal bruits. No CVA tenderness. Musculoskeletal: Patient has pain in his knee. There is no increased warmth redness or swelling however.  No joint effusions. Neurologic:  Normal speech and language. No gross focal neurologic deficits are appreciated. No gait instability. Skin:  Skin is warm, dry and intact. No rash noted. Psychiatric: Mood and affect are normal. Speech and behavior are normal.  ____________________________________________   LABS (all labs ordered are listed, but only abnormal results are displayed)  Labs Reviewed  BASIC METABOLIC PANEL - Abnormal; Notable for the following:    Glucose, Bld 191 (*)    BUN 35 (*)    Creatinine, Ser 1.52 (*)    GFR calc non Af Amer 44 (*)    GFR calc Af Amer 51 (*)    All other components within normal limits  CBC - Abnormal; Notable for the following:    Hemoglobin 10.8 (*)    HCT 34.8 (*)    MCV 71.8 (*)    MCH 22.2 (*)    MCHC 31.0 (*)    All other components within normal limits  PROTIME-INR - Abnormal; Notable for the following:    Prothrombin Time 32.5 (*)    All other components within normal limits  TROPONIN I - Abnormal; Notable for the following:    Troponin I 0.04 (*)    All other components within normal limits  GLUCOSE, CAPILLARY - Abnormal; Notable for the following:    Glucose-Capillary 170 (*)    All other components within normal limits  MRSA PCR SCREENING  TROPONIN I  TROPONIN I  TROPONIN I  URIC ACID   ____________________________________________  EKG  EKG read and interpreted by me says shows tachycardia rate of 155. There appear to be P waves before each T-wave. However I'm unable using the EKG to  determine if there is a flutter or not. ____________________________________________  RADIOLOGY  EKG read by me shows enlarged heart ____________________________________________   PROCEDURES  Patient given diltiazem 5 mg IV twice and drip started. Patient's heart rate comes down to 108-118. However when I go back and talk to him because of 155 again.  ____________________________________________   INITIAL IMPRESSION / ASSESSMENT AND PLAN / ED COURSE  Pertinent labs & imaging results that were available during my care of the patient were reviewed by me and considered in my medical decision making (see chart for details).   ____________________________________________   FINAL CLINICAL IMPRESSION(S) / ED DIAGNOSES  Final diagnoses:  Atrial flutter with rapid ventricular response (HCC)      Arnaldo Natal, MD 07/16/15 [phone]

## 2015-07-16 NOTE — Consult Note (Signed)
ORTHOPAEDIC CONSULTATION  REQUESTING PHYSICIAN: Alford Highland, MD  Chief Complaint:   Left knee pain.  History of Present Illness: [name] is a 70 y.o. male recently admitted for rapid atrial fibrillation who complains of a 7 day history of left knee pain. The patient denies any specific injury to have precipitated the onset of his symptoms. Apparently, the patient presented to the emergency room 3 days ago where x-rays were obtained. These x-rays were unremarkable. He was discharged and advised to follow-up with his primary care provider. The patient denies any fevers or chills, but notes significant pain with attempted weightbearing. He states there may be a small amount of warmth around the knee, but he has not noticed any erythema. He denies any other areas of pain, and denies any numbness or paresthesias down his leg to his foot.  Past Medical History  Diagnosis Date  . Ventricular tachycardia (HCC) 05/20/2014  . Atrial fibrillation and flutter (HCC)   . GI bleeding   . Gastric ulcer   . Diabetes mellitus with complication (HCC)   . Essential hypertension   . Myocardial infarction (HCC)   . CHF (congestive heart failure) [facility])    Past Surgical History  Procedure Laterality Date  . Left heart catheterization with coronary angiogram N/A 05/20/2014    Procedure: LEFT HEART CATHETERIZATION WITH CORONARY ANGIOGRAM;  Surgeon: Micheline Chapman, MD;  Location: [facility] CATH LAB;  Service: Cardiovascular;  Laterality: N/A;   Social History   Social History  . Marital Status: Married    Spouse Name: N/A  . Number of Children: N/A  . Years of Education: N/A   Social History Main Topics  . Smoking status: Former Smoker    Quit date: 04/28/1964  . Smokeless tobacco: Never Used  . Alcohol Use: No  . Drug Use: No  . Sexual Activity: Yes   Other Topics Concern  . None   Social History Narrative   Married, lives  in [place]. Retired from [employer] work. Nonsmoker, no Etoh.   History reviewed. No pertinent family history. No Known Allergies Prior to Admission medications   Medication Sig Start Date End Date Taking? Authorizing Provider  acetaminophen (TYLENOL) 500 MG tablet Take 1,000 mg by mouth every 6 (six) hours as needed for mild pain or headache.   Yes Historical Provider, MD  amiodarone (PACERONE) 400 MG tablet Take 1 tablet (400 mg total) by mouth daily. 04/17/15  Yes Katharina Caper, MD  diltiazem (CARDIZEM CD) 120 MG 24 hr capsule Take 120 mg by mouth daily.   Yes Historical Provider, MD  glipiZIDE (GLUCOTROL) 10 MG tablet Take 1 tablet (10 mg total) by mouth 2 (two) times daily before a meal. 02/05/15  Yes Ellyn Hack, MD  hydrALAZINE (APRESOLINE) 25 MG tablet Take 50 mg by mouth daily.    Yes Historical Provider, MD  metoprolol tartrate (LOPRESSOR) 25 MG tablet Take 1 tablet (25 mg total) by mouth 2 (two) times daily. 02/05/15  Yes Ellyn Hack, MD  nitroGLYCERIN (NITROSTAT) 0.4 MG SL tablet Place 1 tablet (0.4 mg total) under the tongue every 5 (five) minutes x 3 doses as needed for chest pain. 05/27/14  Yes Dwana Melena, PA-C  simvastatin (ZOCOR) 10 MG tablet Take 10 mg by mouth at bedtime.   Yes Historical Provider, MD  valsartan-hydrochlorothiazide (DIOVAN-HCT) 320-25 MG tablet Take 1 tablet by mouth daily.   Yes Historical Provider, MD  warfarin (COUMADIN) 5 MG tablet Take 2.5-5 mg by mouth daily.  Pt takes one tablet Monday-Friday and one-half tablet Saturday and Sunday.   Yes Historical Provider, MD   Dg Chest Portable 1 View  07/16/2015  CLINICAL DATA:  Tachycardia. EXAM: PORTABLE CHEST 1 VIEW COMPARISON:  July 13, 2015. FINDINGS: The heart size and mediastinal contours are within normal limits. Both lungs are clear. No pneumothorax or pleural effusion is noted. The visualized skeletal structures are unremarkable. IMPRESSION: No acute cardiopulmonary abnormality seen. Electronically  Signed   By: Lupita Raider, M.D.   On: 07/16/2015 13:13    Positive ROS: All other systems have been reviewed and were otherwise negative with the exception of those mentioned in the HPI and as above.  Physical Exam: General:  Alert, no acute distress Psychiatric:  Patient is competent for consent with normal mood and affect   Cardiovascular:  No pedal edema Respiratory:  No wheezing, non-labored breathing GI:  Abdomen is soft and non-tender Skin:  No lesions in the area of chief complaint Neurologic:  Sensation intact distally Lymphatic:  No axillary or cervical lymphadenopathy  Orthopedic Exam:  Orthopedic examination is limited to the left knee and lower extremity. Skin inspection of the knee is unremarkable. There is most minimal warmth around the knee, but only minimal swelling and no erythema. He exhibits a small effusion. The patient holds his knee in approximately 20 of flexion. He can flex his knee actively to approximamately 75 before he complains of pain with flexion beyond this point. Within this range, he has little if any pain. He has mild-moderate tenderness to palpation over the superior and superolateral patellar regions, but only minimal medial or lateral joint line tenderness. His knee is stable to varus valgus stressing, as well as with a gentle Lachman maneuver. His patella appears to track well. He has a negative patellar apprehension sign. He is neurovascular intact to his left lower extremity and foot.  X-rays:  Recent standing AP and lateral x-rays of the left knee are available for review. These films demonstrate no significant degenerative changes with overall excellent alignment of the knee in both AP and lateral projections. No fractures or lytic lesions are identified  Assessment: Left knee pain with small effusion of unclear etiology.  Plan: The treatment options are discussed with the patient and his family. 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 His lack of systemic fevers, small effusion, and no overlying erythema make it unlikely that he has a septic joint area After obtaining verbal consent and performing a procedural timeout to verify the appropriate site, the left knee is aspirated sterilely of 4-5 cc of clear straw-colored fluid before the knee is injected with a solution of 2 cc of Kenalog 40 and 8 cc of 0.5% Marcaine. The aspirated fluid will be sent for culture and sensitivity, Gram stain, crystals, and cell count and differential. The patient tolerated the procedure well.  Thank you for ask me to participate in the care of this most pleasant man. I will be happy to follow him with you.   Maryagnes Amos, MD  Beeper #:  [phone]  07/16/2015 4:33 PM

## 2015-07-16 NOTE — Progress Notes (Signed)
ANTICOAGULATION CONSULT NOTE - Initial Consult  Pharmacy Consult for Coumadin Indication: atrial fibrillation  No Known Allergies  Patient Measurements: Height:  (180.3 cm) Weight: 200 lb 9.9 oz (91 kg) IBW/kg (Calculated) : 75.3   Vital Signs: Temp: 97.5 F (36.4 C) (01/18 1443) Temp Source: Oral (01/18 1443) BP: 122/80 mmHg (01/18 1443) Pulse Rate: 70 (01/18 1443)  Labs:  Recent Labs  07/16/15 1122  HGB 10.8*  HCT 34.8*  PLT 347  LABPROT 32.5*  INR 3.25  CREATININE 1.52*  TROPONINI 0.04*    Estimated Creatinine Clearance: 51.4 mL/min (by C-G formula based on Cr of 1.52).   Medical History: Past Medical History  Diagnosis Date  . Ventricular tachycardia (HCC) 05/20/2014  . Atrial fibrillation and flutter (HCC)   . GI bleeding   . Gastric ulcer   . Diabetes mellitus with complication (HCC)   . Essential hypertension   . Myocardial infarction (HCC)   . CHF (congestive heart failure) (HCC)     Medications:  Prescriptions prior to admission  Medication Sig Dispense Refill Last Dose  . acetaminophen (TYLENOL) 500 MG tablet Take 1,000 mg by mouth every 6 (six) hours as needed for mild pain or headache.   07/15/2015 at 2330  . amiodarone (PACERONE) 400 MG tablet Take 1 tablet (400 mg total) by mouth daily. 30 tablet 6 07/16/2015 at Unknown time  . diltiazem (CARDIZEM CD) 120 MG 24 hr capsule Take 120 mg by mouth daily.   07/16/2015 at Unknown time  . glipiZIDE (GLUCOTROL) 10 MG tablet Take 1 tablet (10 mg total) by mouth 2 (two) times daily before a meal. 180 tablet 1 07/16/2015 at Unknown time  . hydrALAZINE (APRESOLINE) 25 MG tablet Take 50 mg by mouth daily.    07/16/2015 at Unknown time  . metoprolol tartrate (LOPRESSOR) 25 MG tablet Take 1 tablet (25 mg total) by mouth 2 (two) times daily. 180 tablet 1 07/16/2015 at 0800  . nitroGLYCERIN (NITROSTAT) 0.4 MG SL tablet Place 1 tablet (0.4 mg total) under the tongue every 5 (five) minutes x 3 doses as needed for  chest pain. 25 tablet 12 PRN at PRN  . simvastatin (ZOCOR) 10 MG tablet Take 10 mg by mouth at bedtime.   07/15/2015 at Unknown time  . valsartan-hydrochlorothiazide (DIOVAN-HCT) 320-25 MG tablet Take 1 tablet by mouth daily.   07/16/2015 at Unknown time  . warfarin (COUMADIN) 5 MG tablet Take 2.5-5 mg by mouth daily. Pt takes one tablet Monday-Friday and one-half tablet Saturday and Sunday.   07/16/2015 at 0800   Scheduled:  . [START ON 07/17/2015] amiodarone  400 mg Oral Daily  . diltiazem  120 mg Oral Daily  . glipiZIDE  10 mg Oral BID AC  . metoprolol tartrate  50 mg Oral BID  . simvastatin  10 mg Oral QHS  . sodium chloride  3 mL Intravenous Q12H   Infusions:  . diltiazem (CARDIZEM) infusion 10 mg/hr (07/16/15 1256)    Assessment: 70 y/o M on Coumadin 5 mg daily except 2.5 mg on Saturday and Sunday admitted with afib.   Goal of Therapy:  INR 2-3   Plan:  INR is supratherapeutic so will hold Coumadin for now and f/u AM INR.   Luisa Hart D 07/16/2015,3:07 PM

## 2015-07-16 NOTE — ED Notes (Addendum)
Patient presents to the ED via [facility] from patient's doctor's office.  Patient went to the doctor because of pain in his left knee and doctor performed an EKG that showed atrial fibrillation.  Patient has a history of afib.  Patient denies feeling weak or strange.  Patient denies feeling palpitations.  Patient's heart rate is in the 150s at this time.  Patient is alert and oriented x 4.  Patient reports being seen for the knee pain in the ED last week and reports being told his pain is caused by arthritis.  Patient denies trauma to his knee.  Patient does have a history of diabetes.

## 2015-07-17 LAB — BASIC METABOLIC PANEL
Anion gap: 8 (ref 5–15)
BUN: 34 mg/dL — AB (ref 6–20)
CHLORIDE: 107 mmol/L (ref 101–111)
CO2: 21 mmol/L — AB (ref 22–32)
CREATININE: 1.5 mg/dL — AB (ref 0.61–1.24)
Calcium: 8.7 mg/dL — ABNORMAL LOW (ref 8.9–10.3)
GFR calc Af Amer: 52 mL/min — ABNORMAL LOW (ref >60)
GFR calc non Af Amer: 45 mL/min — ABNORMAL LOW (ref >60)
Glucose, Bld: 253 mg/dL — ABNORMAL HIGH (ref 65–99)
Potassium: 4.4 mmol/L (ref 3.5–5.1)
SODIUM: 136 mmol/L (ref 135–145)

## 2015-07-17 LAB — CBC
HCT: 32.6 % — ABNORMAL LOW (ref 40.0–52.0)
Hemoglobin: 10.6 g/dL — ABNORMAL LOW (ref 13.0–18.0)
MCH: 23.3 pg — AB (ref 26.0–34.0)
MCHC: 32.5 g/dL (ref 32.0–36.0)
MCV: 71.7 fL — AB (ref 80.0–100.0)
PLATELETS: 330 10*3/uL (ref 150–440)
RBC: 4.54 MIL/uL (ref 4.40–5.90)
RDW: 14.1 % (ref 11.5–14.5)
WBC: 6.2 10*3/uL (ref 3.8–10.6)

## 2015-07-17 LAB — GLUCOSE, CAPILLARY
GLUCOSE-CAPILLARY: 344 mg/dL — AB (ref 65–99)
Glucose-Capillary: 248 mg/dL — ABNORMAL HIGH (ref 65–99)
Glucose-Capillary: 274 mg/dL — ABNORMAL HIGH (ref 65–99)
Glucose-Capillary: 276 mg/dL — ABNORMAL HIGH (ref 65–99)

## 2015-07-17 LAB — PROTIME-INR
INR: 3.33
PROTHROMBIN TIME: 33.1 s — AB (ref 11.4–15.0)

## 2015-07-17 MED ORDER — INSULIN ASPART 100 UNIT/ML ~~LOC~~ SOLN
0.0000 [IU] | Freq: Every day | SUBCUTANEOUS | Status: DC
  Administered 2015-07-17: 3 [IU] via SUBCUTANEOUS
  Filled 2015-07-17: qty 3

## 2015-07-17 MED ORDER — INSULIN ASPART 100 UNIT/ML ~~LOC~~ SOLN
0.0000 [IU] | Freq: Three times a day (TID) | SUBCUTANEOUS | Status: DC
  Administered 2015-07-17: 8 [IU] via SUBCUTANEOUS
  Administered 2015-07-18: 15 [IU] via SUBCUTANEOUS
  Filled 2015-07-17: qty 8
  Filled 2015-07-17: qty 15

## 2015-07-17 MED ORDER — METOPROLOL TARTRATE 50 MG PO TABS
50.0000 mg | ORAL_TABLET | Freq: Four times a day (QID) | ORAL | Status: DC
  Administered 2015-07-17 – 2015-07-20 (×14): 50 mg via ORAL
  Filled 2015-07-17 (×14): qty 1

## 2015-07-17 NOTE — Progress Notes (Signed)
Inpatient Diabetes Program Recommendations  AACE/ADA: New Consensus Statement on Inpatient Glycemic Control (2015)  Target Ranges:  Prepandial:   less than 140 mg/dL      Peak postprandial:   less than 180 mg/dL (1-2 hours)      Critically ill patients:  140 - 180 mg/dL   Review of Glycemic Control  Results for BETHANY, CUMMING (MRN [id number]) as of 07/17/2015 12:11  Ref. Range 07/16/2015 14:46 07/16/2015 16:24 07/16/2015 21:08 07/17/2015 07:38 07/17/2015 11:22  Glucose-Capillary Latest Ref Range: 65-99 mg/dL 086 (H) 578 (H) 469 (H) 248 (H) 344 (H)    Diabetes history: A1C 7.6% on 05/09/15 Outpatient Diabetes medications: Glucotrol  bid Current orders for Inpatient glycemic control: Glucotrol  bid, Novolog 0-9 units tid, Novolog 0-5 units qhs  Inpatient Diabetes Program Recommendations: Agree with current orders for diabetes medications- elevated blood sugars now likely as a result of steroids. No further steroids ordered so no recommendations indicated.  Susette Racer, RN, BA, MHA, CDE Diabetes Coordinator Inpatient Diabetes Program  [phone] (Team Pager) [phone] [facility] Office) 07/17/2015 12:13 PM

## 2015-07-17 NOTE — Progress Notes (Signed)
PT Cancellation Note  Patient Details Name: [name] MRN: [id number] DOB: 12-Jul-1945   Cancelled Treatment:    Reason Eval/Treat Not Completed: Patient not medically ready.  Nursing recommending holding PT currently d/t HR issues.  Will re-attempt PT eval at a later date/time as medically appropriate.   [name] 07/17/2015, 10:09 AM Hendricks Limes, PT [phone]

## 2015-07-17 NOTE — Progress Notes (Signed)
Subjective: Patient notes that his left knee is feeling much better. He still has some discomfort in the superolateral patellar region, but already has gotten out of bed and taken several steps in his room.   Objective: Vital signs in last 24 hours: Temp:  [97.5 F (36.4 C)-97.9 F (36.6 C)] 97.7 F (36.5 C) (01/19 0800) Pulse Rate:  [45-155] 70 (01/19 0700) Resp:  [0-25] 11 (01/19 0700) BP: (113-151)/(63-96) 120/79 mmHg (01/19 0700) SpO2:  [97 %-100 %] 100 % (01/19 0700) Weight:  [91 kg (200 lb 9.9 oz)-92.534 kg (204 lb)] 91 kg (200 lb 9.9 oz) (01/18 1443)  Intake/Output from previous day: 01/18 0701 - 01/19 0700 In: 650.5 [P.O.:480; I.V.:170.5] Out: 600 [Urine:600] Intake/Output this shift: Total I/O In: -  Out: 225 [Urine:225]   Recent Labs  07/16/15 1122 07/17/15 0405  HGB 10.8* 10.6*    Recent Labs  07/16/15 1122 07/17/15 0405  WBC 4.8 6.2  RBC 4.84 4.54  HCT 34.8* 32.6*  PLT 347 330    Recent Labs  07/16/15 1122 07/17/15 0405  NA 140 136  K 3.9 4.4  CL 105 107  CO2 26 21*  BUN 35* 34*  CREATININE 1.52* 1.50*  GLUCOSE 191* 253*  CALCIUM 9.0 8.7*    Recent Labs  07/16/15 1122 07/17/15 0405  INR 3.25 3.33    Physical Exam: Orthopedic examination is limited to the left knee. Skin inspection around the knee is unremarkable. He has at most a trace effusion and only minimal swelling. He is able to range his knee really from 0-100 with only minimal discomfort at maximal flexion. He has no tenderness along the medial or lateral joint lines, and only mild tenderness over the superolateral patella region. He has no pain with resisted knee extension. He is neurovascular intact to his left lower extremity and foot.  Assessment: Left knee pain with effusion, improved symptomatically.  Plan: The treatment options are reviewed with the patient and his family. At this point, the patient seems to be doing quite well. His initial lab work from his knee  aspiration showed no crystals and less than 5000 white cells, consistent with a benign process. I understand an MRI scan has been ordered on his knee. I am not sure that this is necessary as it will not change our plans at this time. Certainly if his symptoms worsen, then this would be the next appropriate step.  Thank you for asking me to participate in the care of this most pleasant man. I will sign off at this time. Please contact me if his symptoms worsen or recur. I will be happy to see him in my office on an as necessary basis.   Excell Seltzer Sayge Brienza 07/17/2015, 10:09 AM

## 2015-07-17 NOTE — Progress Notes (Signed)
[facility] [place] at [facility] [address] [facility]   PATIENT NAME: [name]    MR#:  [id number]  DATE OF BIRTH:  [birth year]  SUBJECTIVE:  CHIEF COMPLAINT:   Chief Complaint  Patient presents with  . Knee Pain  . Atrial Fibrillation   -Sent in from PCP office due to elevated heart rate. Remains in atrial flutter heart rate in 110 range while on Cardizem drip. -Patient denies any chest pain or palpitations. -Left knee pain improved after aspiration.  REVIEW OF SYSTEMS:  Review of Systems  Constitutional: Negative for fever and chills.  HENT: Negative for ear discharge, ear pain and nosebleeds.   Eyes: Negative for blurred vision and double vision.  Respiratory: Negative for cough, shortness of breath and wheezing.   Cardiovascular: Negative for chest pain and palpitations.  Gastrointestinal: Negative for nausea, vomiting, abdominal pain, diarrhea and constipation.  Genitourinary: Negative for dysuria and frequency.  Musculoskeletal: Positive for joint pain.       Left knee pain  Neurological: Negative for dizziness, sensory change, speech change, focal weakness, seizures, weakness and headaches.  Psychiatric/Behavioral: Negative for depression.    DRUG ALLERGIES:  No Known Allergies  VITALS:  Blood pressure 129/69, pulse 44, temperature 96.8 F (36 C), temperature source Axillary, resp. rate 20, height  (1.803 m), weight 91 kg (200 lb 9.9 oz), SpO2 99 %.  PHYSICAL EXAMINATION:  Physical Exam  GENERAL:  69 y.o.-year-old patient lying in the bed with no acute distress.  EYES: Pupils equal, round, reactive to light and accommodation. No scleral icterus. Extraocular muscles intact.  HEENT: Head atraumatic, normocephalic. Oropharynx and nasopharynx clear.  NECK:  Supple, no jugular venous distention. No thyroid enlargement, no tenderness.  LUNGS: Normal breath sounds bilaterally, no wheezing, rales,rhonchi or crepitation. No use of accessory muscles of  respiration.  CARDIOVASCULAR: S1, S2 normal. Rapid and irregular heart beat. No murmurs, rubs, or gallops.  ABDOMEN: Soft, nontender, nondistended. Bowel sounds present. No organomegaly or mass.  EXTREMITIES: No pedal edema, cyanosis, or clubbing. Minimal left suprapatellar tenderness on exam. No swelling noted. NEUROLOGIC: Cranial nerves II through XII are intact. Muscle strength 5/5 in all extremities. Sensation intact. Gait not checked.  PSYCHIATRIC: The patient is alert and oriented x 3.  SKIN: No obvious rash, lesion, or ulcer.    LABORATORY PANEL:   CBC  Recent Labs Lab 07/17/15 0405  WBC 6.2  HGB 10.6*  HCT 32.6*  PLT 330   ------------------------------------------------------------------------------------------------------------------  Chemistries   Recent Labs Lab 07/13/15 0915  07/17/15 0405  NA 138  < > 136  K 4.3  < > 4.4  CL 107  < > 107  CO2 22  < > 21*  GLUCOSE 196*  < > 253*  BUN 29*  < > 34*  CREATININE 1.57*  < > 1.50*  CALCIUM 9.0  < > 8.7*  AST 23  --   --   ALT 34  --   --   ALKPHOS 55  --   --   BILITOT 0.4  --   --   < > = values in this interval not displayed. ------------------------------------------------------------------------------------------------------------------  Cardiac Enzymes  Recent Labs Lab 07/16/15 1922  TROPONINI <0.03   ------------------------------------------------------------------------------------------------------------------  RADIOLOGY:  Dg Chest Portable 1 View  07/16/2015  CLINICAL DATA:  Tachycardia. EXAM: PORTABLE CHEST 1 VIEW COMPARISON:  July 13, 2015. FINDINGS: The heart size and mediastinal contours are within normal limits. Both lungs are clear. No pneumothorax or pleural effusion is noted.  The visualized skeletal structures are unremarkable. IMPRESSION: No acute cardiopulmonary abnormality seen. Electronically Signed   By: Lupita Raider, M.D.   On: 07/16/2015 13:13    EKG:   Orders placed or  performed during the hospital encounter of 07/16/15  . EKG 12-Lead  . EKG 12-Lead  . ED EKG within 10 minutes  . ED EKG within 10 minutes    ASSESSMENT AND PLAN:   70 year old male with past medical history significant for paroxysmal atrial fibrillation, gastric ulcer, diabetes mellitus and hypertension presents to the hospital secondary to elevated heart rate noticed at his PCPs office.  #1 atrial flutter with rapid ventricular response-heart rate persistently in 90-110 range. -Remains on Cardizem drip. Titrate as tolerated. -Oral amiodarone and oral metoprolol started. -Appreciate cardiology consult. -Check an echocardiogram. -Continue Coumadin for anticoagulation.  #2 hypertension-currently on Cardizem drip and oral metoprolol. -Continue to hold valsartan and hydrochlorothiazide.  #3 left knee pain-likely benign or effusion. Appreciate orthopedic consult. Status post steroid injection and aspiration of fluid. No crystals noted. No significant elevation of white count to suggest infection. -Much improved pain. Ambulate as tolerated.  #4 CKD- stable at this time. Continue to monitor  #5 chronic diastolic CHF- stable and continue to monitor at this time. Not on any Lasix at this time.  #6 diabetes mellitus-continue glipizide and also sliding scale insulin. Sugars elevated yesterday due to steroid injection.  #7 DVT prophylaxis-on Coumadin-currently on hold due to supratherapeutic INR.     All the records are reviewed and case discussed with Care Management/Social Workerr. Management plans discussed with the patient, family and they are in agreement.  CODE STATUS: Full Code  TOTAL CRITICAL CARE TIME SPENT IN TAKING CARE OF THIS PATIENT: 38 minutes.   POSSIBLE D/C IN 1 DAY, DEPENDING ON CLINICAL CONDITION.   Enid Baas M.D on 07/17/2015 at 3:36 PM  Between 7am to 6pm - Pager - [phone]  After 6pm go to [email or web address] - password [email or web address] [facility]  [facility]  Office  [phone]  CC: Primary care physician; Brayton El, MD

## 2015-07-17 NOTE — Progress Notes (Signed)
ANTICOAGULATION CONSULT NOTE - Initial Consult  Pharmacy Consult for Coumadin Indication: atrial fibrillation  No Known Allergies  Patient Measurements: Height:  (180.3 cm) Weight: 200 lb 9.9 oz (91 kg) IBW/kg (Calculated) : 75.3   Vital Signs: Temp: 97.7 F (36.5 C) (01/19 0800) Temp Source: Oral (01/19 0800) BP: 120/79 mmHg (01/19 0700) Pulse Rate: 70 (01/19 0700)  Labs:  Recent Labs  07/16/15 1122 07/16/15 1547 07/16/15 1922 07/17/15 0405  HGB 10.8*  --   --  10.6*  HCT 34.8*  --   --  32.6*  PLT 347  --   --  330  LABPROT 32.5*  --   --  33.1*  INR 3.25  --   --  3.33  CREATININE 1.52*  --   --  1.50*  TROPONINI 0.04* 0.03 <0.03  --     Estimated Creatinine Clearance: 52.1 mL/min (by C-G formula based on Cr of 1.5).   Medical History: Past Medical History  Diagnosis Date  . Ventricular tachycardia (HCC) 05/20/2014  . Atrial fibrillation and flutter (HCC)   . GI bleeding   . Gastric ulcer   . Diabetes mellitus with complication (HCC)   . Essential hypertension   . Myocardial infarction (HCC)   . CHF (congestive heart failure) (HCC)     Medications:  Prescriptions prior to admission  Medication Sig Dispense Refill Last Dose  . acetaminophen (TYLENOL) 500 MG tablet Take 1,000 mg by mouth every 6 (six) hours as needed for mild pain or headache.   07/15/2015 at 2330  . amiodarone (PACERONE) 400 MG tablet Take 1 tablet (400 mg total) by mouth daily. 30 tablet 6 07/16/2015 at Unknown time  . diltiazem (CARDIZEM CD) 120 MG 24 hr capsule Take 120 mg by mouth daily.   07/16/2015 at Unknown time  . glipiZIDE (GLUCOTROL) 10 MG tablet Take 1 tablet (10 mg total) by mouth 2 (two) times daily before a meal. 180 tablet 1 07/16/2015 at Unknown time  . hydrALAZINE (APRESOLINE) 25 MG tablet Take 50 mg by mouth daily.    07/16/2015 at Unknown time  . metoprolol tartrate (LOPRESSOR) 25 MG tablet Take 1 tablet (25 mg total) by mouth 2 (two) times daily. 180 tablet 1  07/16/2015 at 0800  . nitroGLYCERIN (NITROSTAT) 0.4 MG SL tablet Place 1 tablet (0.4 mg total) under the tongue every 5 (five) minutes x 3 doses as needed for chest pain. 25 tablet 12 PRN at PRN  . simvastatin (ZOCOR) 10 MG tablet Take 10 mg by mouth at bedtime.   07/15/2015 at Unknown time  . valsartan-hydrochlorothiazide (DIOVAN-HCT) 320-25 MG tablet Take 1 tablet by mouth daily.   07/16/2015 at Unknown time  . warfarin (COUMADIN) 5 MG tablet Take 2.5-5 mg by mouth daily. Pt takes one tablet Monday-Friday and one-half tablet Saturday and Sunday.   07/16/2015 at 0800   Scheduled:  . amiodarone  400 mg Oral Daily  . diltiazem  120 mg Oral Daily  . glipiZIDE  10 mg Oral BID AC  . insulin aspart  0-5 Units Subcutaneous QHS  . insulin aspart  0-9 Units Subcutaneous TID WC  . metoprolol tartrate  50 mg Oral BID  . simvastatin  10 mg Oral QHS  . sodium chloride  3 mL Intravenous Q12H   Infusions:  . diltiazem (CARDIZEM) infusion 15 mg/hr (07/17/15 0865)    Assessment: 70 y/o M on Coumadin 5 mg daily except 2.5 mg on Saturday and Sunday admitted with afib.   Goal  of Therapy:  INR 2-3   Plan:  INR remains supratherapeutic so will continue to hold Coumadin for now and f/u AM INR.   [name] D 07/17/2015,11:08 AM

## 2015-07-17 NOTE — Consult Note (Signed)
Reason for Consult: atrial fibrillation rapid ventricular response to atrial flutter congestive heart failure Referring Physician:  Dr. Loletha Grayer  Hospitalist, Dr Keith Rake  [name] is an 70 y.o. male.  HPI:  A 74 year all black male with history of known congestive heart failure systolic dysfunction atrial fibrillation rapid ventricular response on amiodarone. The patient presented for evaluation of knee trouble but was found have tachycardia atrial flutter atrial fibrillation so he was admitted for rate control. Patient was placed on Cardizem drip maintained on amiodarone and metoprolol anticoagulation with Coumadin. Patient denies any chest pain no blackout spells of syncope no rub significant shortness of breath. Patient has dyspnea on exertion states to be compliant with his medications cardiology was consulted because of tachycardia  Past Medical History  Diagnosis Date  . Ventricular tachycardia ([place]) 05/20/2014  . Atrial fibrillation and flutter ([place])   . GI bleeding   . Gastric ulcer   . Diabetes mellitus with complication ([place])   . Essential hypertension   . Myocardial infarction ([place])   . CHF (congestive heart failure) [facility])     Past Surgical History  Procedure Laterality Date  . Left heart catheterization with coronary angiogram N/A 05/20/2014    Procedure: LEFT HEART CATHETERIZATION WITH CORONARY ANGIOGRAM;  Surgeon: Blane Ohara, MD;  Location: [facility] CATH LAB;  Service: Cardiovascular;  Laterality: N/A;  . Hernia repair      Family History  Problem Relation Age of Onset  . Congestive Heart Failure Mother     Social History:  reports that he quit smoking about 51 years ago. He has never used smokeless tobacco. He reports that he does not drink alcohol or use illicit drugs.  Allergies: No Known Allergies  Medications: I have reviewed the patient's current medications.  Results for orders placed or performed during the hospital encounter of 07/16/15  (from the past 48 hour(s))  Basic metabolic panel     Status: Abnormal   Collection Time: 07/16/15 11:22 AM  Result Value Ref Range   Sodium 140 135 - 145 mmol/L   Potassium 3.9 3.5 - 5.1 mmol/L   Chloride 105 101 - 111 mmol/L   CO2 26 22 - 32 mmol/L   Glucose, Bld 191 (H) 65 - 99 mg/dL   BUN 35 (H) 6 - 20 mg/dL   Creatinine, Ser 1.52 (H) 0.61 - 1.24 mg/dL   Calcium 9.0 8.9 - 10.3 mg/dL   GFR calc non Af Amer 44 (L) >60 mL/min   GFR calc Af Amer 51 (L) >60 mL/min    Comment: (NOTE) The eGFR has been calculated using the CKD EPI equation. This calculation has not been validated in all clinical situations. eGFR's persistently <60 mL/min signify possible Chronic Kidney Disease.    Anion gap 9 5 - 15  CBC     Status: Abnormal   Collection Time: 07/16/15 11:22 AM  Result Value Ref Range   WBC 4.8 3.8 - 10.6 K/uL   RBC 4.84 4.40 - 5.90 MIL/uL   Hemoglobin 10.8 (L) 13.0 - 18.0 g/dL   HCT 34.8 (L) 40.0 - 52.0 %   MCV 71.8 (L) 80.0 - 100.0 fL   MCH 22.2 (L) 26.0 - 34.0 pg   MCHC 31.0 (L) 32.0 - 36.0 g/dL   RDW 14.1 11.5 - 14.5 %   Platelets 347 150 - 440 K/uL  Protime-INR - (order if Patient is taking Coumadin / Warfarin)     Status: Abnormal   Collection Time: 07/16/15  11:22 AM  Result Value Ref Range   Prothrombin Time 32.5 (H) 11.4 - 15.0 seconds   INR 3.25   Troponin I     Status: Abnormal   Collection Time: 07/16/15 11:22 AM  Result Value Ref Range   Troponin I 0.04 (H) <0.031 ng/mL    Comment: CRITICAL RESULT CALLED TO, READ BACK BY AND VERIFIED WITH ANNA Tomasa Rand, RN [phone] ON 07/16/15. VAB        PERSISTENTLY INCREASED TROPONIN VALUES IN THE RANGE OF 0.04-0.49 ng/mL CAN BE SEEN IN:       -UNSTABLE ANGINA       -CONGESTIVE HEART FAILURE       -MYOCARDITIS       -CHEST TRAUMA       -ARRYHTHMIAS       -LATE PRESENTING MYOCARDIAL INFARCTION       -COPD   CLINICAL FOLLOW-UP RECOMMENDED.   MRSA PCR Screening     Status: None   Collection Time: 07/16/15  2:42 PM  Result  Value Ref Range   MRSA by PCR NEGATIVE NEGATIVE    Comment:        The GeneXpert MRSA Assay (FDA approved for NASAL specimens only), is one component of a comprehensive MRSA colonization surveillance program. It is not intended to diagnose MRSA infection nor to guide or monitor treatment for MRSA infections.   Glucose, capillary     Status: Abnormal   Collection Time: 07/16/15  2:46 PM  Result Value Ref Range   Glucose-Capillary 170 (H) 65 - 99 mg/dL  Troponin I     Status: None   Collection Time: 07/16/15  3:47 PM  Result Value Ref Range   Troponin I 0.03 <0.031 ng/mL    Comment:        NO INDICATION OF MYOCARDIAL INJURY.   Uric acid     Status: Abnormal   Collection Time: 07/16/15  3:47 PM  Result Value Ref Range   Uric Acid, Serum 10.8 (H) 4.4 - 7.6 mg/dL  Glucose, capillary     Status: Abnormal   Collection Time: 07/16/15  4:24 PM  Result Value Ref Range   Glucose-Capillary 230 (H) 65 - 99 mg/dL  Synovial cell count + diff, w/ crystals     Status: Abnormal   Collection Time: 07/16/15  5:15 PM  Result Value Ref Range   Color, Synovial YELLOW (A) YELLOW   Appearance-Synovial CLEAR CLEAR   Crystals, Fluid NONE SEEN    WBC, Synovial 4889 (H) 0 - 200 /cu mm   Neutrophil, Synovial 22 %   Lymphocytes-Synovial Fld 24 %   Monocyte-Macrophage-Synovial Fluid 54 %   Eosinophils-Synovial O %   Other Cells-SYN O   Body fluid culture     Status: None (Preliminary result)   Collection Time: 07/16/15  5:15 PM  Result Value Ref Range   Specimen Description JOINT FLUID KNEE    Special Requests NONE    Gram Stain PENDING    Culture NO GROWTH < 12 HOURS    Report Status PENDING   Troponin I     Status: None   Collection Time: 07/16/15  7:22 PM  Result Value Ref Range   Troponin I <0.03 <0.031 ng/mL    Comment:        NO INDICATION OF MYOCARDIAL INJURY.   Glucose, capillary     Status: Abnormal   Collection Time: 07/16/15  9:08 PM  Result Value Ref Range    Glucose-Capillary 159 (H) 65 - 99 mg/dL  Basic metabolic panel     Status: Abnormal   Collection Time: 07/17/15  4:05 AM  Result Value Ref Range   Sodium 136 135 - 145 mmol/L   Potassium 4.4 3.5 - 5.1 mmol/L   Chloride 107 101 - 111 mmol/L   CO2 21 (L) 22 - 32 mmol/L   Glucose, Bld 253 (H) 65 - 99 mg/dL   BUN 34 (H) 6 - 20 mg/dL   Creatinine, Ser 1.50 (H) 0.61 - 1.24 mg/dL   Calcium 8.7 (L) 8.9 - 10.3 mg/dL   GFR calc non Af Amer 45 (L) >60 mL/min   GFR calc Af Amer 52 (L) >60 mL/min    Comment: (NOTE) The eGFR has been calculated using the CKD EPI equation. This calculation has not been validated in all clinical situations. eGFR's persistently <60 mL/min signify possible Chronic Kidney Disease.    Anion gap 8 5 - 15  CBC     Status: Abnormal   Collection Time: 07/17/15  4:05 AM  Result Value Ref Range   WBC 6.2 3.8 - 10.6 K/uL   RBC 4.54 4.40 - 5.90 MIL/uL   Hemoglobin 10.6 (L) 13.0 - 18.0 g/dL   HCT 32.6 (L) 40.0 - 52.0 %   MCV 71.7 (L) 80.0 - 100.0 fL   MCH 23.3 (L) 26.0 - 34.0 pg   MCHC 32.5 32.0 - 36.0 g/dL   RDW 14.1 11.5 - 14.5 %   Platelets 330 150 - 440 K/uL  Protime-INR     Status: Abnormal   Collection Time: 07/17/15  4:05 AM  Result Value Ref Range   Prothrombin Time 33.1 (H) 11.4 - 15.0 seconds   INR 3.33   Glucose, capillary     Status: Abnormal   Collection Time: 07/17/15  7:38 AM  Result Value Ref Range   Glucose-Capillary 248 (H) 65 - 99 mg/dL  Glucose, capillary     Status: Abnormal   Collection Time: 07/17/15 11:22 AM  Result Value Ref Range   Glucose-Capillary 344 (H) 65 - 99 mg/dL  Glucose, capillary     Status: Abnormal   Collection Time: 07/17/15  4:24 PM  Result Value Ref Range   Glucose-Capillary 274 (H) 65 - 99 mg/dL    Dg Chest Portable 1 View  07/16/2015  CLINICAL DATA:  Tachycardia. EXAM: PORTABLE CHEST 1 VIEW COMPARISON:  July 13, 2015. FINDINGS: The heart size and mediastinal contours are within normal limits. Both lungs are  clear. No pneumothorax or pleural effusion is noted. The visualized skeletal structures are unremarkable. IMPRESSION: No acute cardiopulmonary abnormality seen. Electronically Signed   By: Marijo Conception, M.D.   On: 07/16/2015 13:13    Review of Systems  Constitutional: Positive for malaise/fatigue.  HENT: Positive for congestion.   Eyes: Negative.   Respiratory: Positive for shortness of breath.   Cardiovascular: Positive for chest pain, palpitations, orthopnea, leg swelling and PND.  Gastrointestinal: Negative.   Genitourinary: Negative.   Musculoskeletal: Negative.   Skin: Negative.   Neurological: Negative.   Endo/Heme/Allergies: Negative.   Psychiatric/Behavioral: Negative.    Blood pressure 121/71, pulse 63, temperature 97.5 F (36.4 C), temperature source Oral, resp. rate 17, height 5' 11"  (1.803 m), weight 91 kg (200 lb 9.9 oz), SpO2 100 %. Physical Exam  Constitutional: He is oriented to person, place, and time. He appears well-developed and well-nourished.  HENT:  Head: Normocephalic and atraumatic.  Eyes: Conjunctivae and EOM are normal. Pupils are equal, round, and reactive to light.  Neck:  Normal range of motion. Neck supple.  Cardiovascular: Intact distal pulses.  An irregularly irregular rhythm present. Tachycardia present.  PMI is displaced.  Exam reveals gallop and S3.   Murmur heard.  Systolic murmur is present with a grade of 2/6  Respiratory: Effort normal and breath sounds normal.  GI: Soft. Bowel sounds are normal.  Musculoskeletal: Normal range of motion.  Neurological: He is alert and oriented to person, place, and time. He has normal reflexes.  Skin: Skin is warm.  Psychiatric: He has a normal mood and affect.    Assessment/Plan:  atrial fibrillation atrial flutter rapid ventricular response  congestive heart failure  possible ventricular tachycardia wide complex tachycardia  diabetes uncomplicated  hypertension  coronary artery disease  chronic  renal insufficiency  coagulopathy secondary to Coumadin . PLAN  agree with  Rule out for myocardial infarction  wean IV Cardizem to off  increased metoprolol by doubling the dose  start p.o. Cardizem 60 mg q.6 hours  continue diabetes management  With glipizide and insulin  continue Coumadin for anticoagulation therapy  maintain amiodarone for antiarrhythmic  hyperlipidemia with simvastatin therapy   Continue medical therapy for rapid ventricular response atrial fibrillation  [name],Jay D. 07/17/2015, 5:16 PM

## 2015-07-18 LAB — GLUCOSE, CAPILLARY
GLUCOSE-CAPILLARY: 375 mg/dL — AB (ref 65–99)
GLUCOSE-CAPILLARY: 293 mg/dL — AB (ref 65–99)
Glucose-Capillary: 258 mg/dL — ABNORMAL HIGH (ref 65–99)
Glucose-Capillary: 238 mg/dL — ABNORMAL HIGH (ref 65–99)

## 2015-07-18 LAB — CBC
HEMATOCRIT: 31.3 % — AB (ref 40.0–52.0)
HEMOGLOBIN: 9.9 g/dL — AB (ref 13.0–18.0)
MCH: 22.5 pg — ABNORMAL LOW (ref 26.0–34.0)
MCHC: 31.5 g/dL — ABNORMAL LOW (ref 32.0–36.0)
MCV: 71.4 fL — ABNORMAL LOW (ref 80.0–100.0)
Platelets: 329 10*3/uL (ref 150–440)
RBC: 4.39 MIL/uL — ABNORMAL LOW (ref 4.40–5.90)
RDW: 14.6 % — AB (ref 11.5–14.5)
WBC: 12.3 10*3/uL — AB (ref 3.8–10.6)

## 2015-07-18 LAB — BASIC METABOLIC PANEL
ANION GAP: 10 (ref 5–15)
BUN: 56 mg/dL — ABNORMAL HIGH (ref 6–20)
CHLORIDE: 103 mmol/L (ref 101–111)
CO2: 21 mmol/L — AB (ref 22–32)
Calcium: 8.7 mg/dL — ABNORMAL LOW (ref 8.9–10.3)
Creatinine, Ser: 1.71 mg/dL — ABNORMAL HIGH (ref 0.61–1.24)
GFR calc non Af Amer: 38 mL/min — ABNORMAL LOW (ref >60)
GFR, EST AFRICAN AMERICAN: 45 mL/min — AB (ref >60)
Glucose, Bld: 269 mg/dL — ABNORMAL HIGH (ref 65–99)
Potassium: 4.4 mmol/L (ref 3.5–5.1)
Sodium: 134 mmol/L — ABNORMAL LOW (ref 135–145)

## 2015-07-18 LAB — PROTIME-INR
INR: 3.76
PROTHROMBIN TIME: 36.3 s — AB (ref 11.4–15.0)

## 2015-07-18 LAB — MAGNESIUM: Magnesium: 2.1 mg/dL (ref 1.7–2.4)

## 2015-07-18 MED ORDER — INSULIN ASPART 100 UNIT/ML ~~LOC~~ SOLN
0.0000 [IU] | Freq: Every day | SUBCUTANEOUS | Status: DC
  Administered 2015-07-18 – 2015-07-19 (×2): 2 [IU] via SUBCUTANEOUS
  Filled 2015-07-18 (×2): qty 2

## 2015-07-18 MED ORDER — INSULIN ASPART 100 UNIT/ML ~~LOC~~ SOLN
4.0000 [IU] | Freq: Three times a day (TID) | SUBCUTANEOUS | Status: DC
  Administered 2015-07-18 – 2015-07-21 (×9): 4 [IU] via SUBCUTANEOUS
  Filled 2015-07-18 (×9): qty 4

## 2015-07-18 MED ORDER — INSULIN ASPART 100 UNIT/ML ~~LOC~~ SOLN
0.0000 [IU] | Freq: Three times a day (TID) | SUBCUTANEOUS | Status: DC
  Administered 2015-07-18 – 2015-07-19 (×4): 11 [IU] via SUBCUTANEOUS
  Administered 2015-07-20 (×3): 7 [IU] via SUBCUTANEOUS
  Administered 2015-07-21: 11 [IU] via SUBCUTANEOUS
  Administered 2015-07-21: 7 [IU] via SUBCUTANEOUS
  Filled 2015-07-18: qty 7
  Filled 2015-07-18: qty 8
  Filled 2015-07-18: qty 11
  Filled 2015-07-18: qty 7
  Filled 2015-07-18 (×4): qty 11
  Filled 2015-07-18: qty 7

## 2015-07-18 MED ORDER — SODIUM CHLORIDE 0.9 % IJ SOLN
3.0000 mL | INTRAMUSCULAR | Status: DC | PRN
  Administered 2015-07-20 (×2): 3 mL via INTRAVENOUS
  Filled 2015-07-18 (×2): qty 10

## 2015-07-18 NOTE — Progress Notes (Signed)
  Inpatient Diabetes Program Recommendations  AACE/ADA: New Consensus Statement on Inpatient Glycemic Control (2015)  Target Ranges:  Prepandial:   less than 140 mg/dL      Peak postprandial:   less than 180 mg/dL (1-2 hours)      Critically ill patients:  140 - 180 mg/dL   Review of Glycemic Control  Results for [name], [name] (MRN [id number]) as of 07/18/2015 13:48  Ref. Range 07/17/2015 11:22 07/17/2015 16:24 07/17/2015 21:28 07/18/2015 07:50 07/18/2015 11:34  Glucose-Capillary Latest Ref Range: 65-99 mg/dL 409 (H) 811 (H) 914 (H) 258 (H) 375 (H)   Diabetes history: A1C 7.6% on 05/09/15 Outpatient Diabetes medications: Glucotrol  bid Current orders for Inpatient glycemic control: Glucotrol  bid, Novolog 0-9 units tid, Novolog 0-5 units qhs  Inpatient Diabetes Program Recommendations: Steroids now complete and blood sugars remain elevated.  Kidney function poor and worse than yesterday and I worry about hypoglycemia as a result of Glucotrol.  Please consider d/c Glucotrol and start 10 units Lantus qhs.  Susette Racer, RN, BA, MHA, CDE Diabetes Coordinator Inpatient Diabetes Program  [phone] (Team Pager) [phone] [facility] Office) 07/18/2015 2:01 PM

## 2015-07-18 NOTE — Progress Notes (Signed)
Report called to [facility] RN on 2A. Patient to be transferred to room 256. Patient and family aware of transfer.

## 2015-07-18 NOTE — Evaluation (Signed)
Physical Therapy Evaluation Patient Details Name: [name] MRN: [id number] DOB: Jun 21, 1946 Today's Date: 07/18/2015   History of Present Illness  Pt is a 70 y.o. male presenting to hospital (from PCP to ED) with a-fib and L knee pain.  Pt admitted to CCU 07/16/15 with SVT and rapid atrial fibrillation and placed on Cardizem drip.  L knee x-ray negative (Per MD Poggi pt WBAT L LE).  PMH includes DM, MI, CHF, a-fib with flutter, and ventric tachycardia.  Clinical Impression  Prior to admission, pt was independent (pt used SPC for 7 days prior to admission d/t L knee pain).  Pt lives with his wife and 2 daughters in 1 level home with 6 steps to enter with L railing.  Currently pt is CGA with transfers and ambulating 5 feet without AD.  Pt appearing steady with mobility and no c/o L knee pain during session.  HR 108 bpm (at rest) to 128 to 138 bpm with activity during session.  BP at rest 128/86 and increased to 136/120 post activity.  Activity limited d/t vitals but pt moves fairly well otherwise and appears very motivated to mobilize once appropriate.  Nursing notified of all the vitals and treatment session.  Pt would benefit from skilled PT to address noted impairments and functional limitations.  Recommend pt discharge to home with support of family when medically appropriate.     Follow Up Recommendations No PT follow up    Equipment Recommendations       Recommendations for Other Services       Precautions / Restrictions Precautions Precautions: Fall Precaution Comments: Monitor HR and BP Restrictions Weight Bearing Restrictions: No Other Position/Activity Restrictions: WBAT L LE      Mobility  Bed Mobility               General bed mobility comments: Deferred d/t pt already sitting up in chair  Transfers Overall transfer level: Needs assistance Equipment used: None Transfers: Sit to/from Stand Sit to Stand: Min guard         General transfer comment:  steady without loss of balance; strong stand  Ambulation/Gait Ambulation/Gait assistance: Min guard Ambulation Distance (Feet): 5 Feet Assistive device: None Gait Pattern/deviations: Step-through pattern     General Gait Details: steady without loss of balance  Stairs            Wheelchair Mobility    Modified Rankin (Stroke Patients Only)       Balance Overall balance assessment: Needs assistance Sitting-balance support: No upper extremity supported;Feet supported Sitting balance-Leahy Scale: Normal     Standing balance support: No upper extremity supported Standing balance-Leahy Scale: Good                               Pertinent Vitals/Pain Pain Assessment: No/denies pain    Home Living Family/patient expects to be discharged to:: Private residence Living Arrangements: Spouse/significant other;Children (Wife and 2 daughter's) Available Help at Discharge: Family   Home Access: Stairs to enter Entrance Stairs-Rails: Left Entrance Stairs-Number of Steps: 6 Home Layout: One level Home Equipment: Cane - single point      Prior Function Level of Independence: Independent         Comments: Pt had been using a SPC for 7 days prior to admission d/t L knee pain.  Pt denies any falls in past 6 months.     Hand Dominance  Extremity/Trunk Assessment   Upper Extremity Assessment: Overall WFL for tasks assessed           Lower Extremity Assessment: Overall WFL for tasks assessed      Cervical / Trunk Assessment: Normal  Communication   Communication: No difficulties  Cognition Arousal/Alertness: Awake/alert Behavior During Therapy: WFL for tasks assessed/performed Overall Cognitive Status: Within Functional Limits for tasks assessed                      General Comments   Nursing cleared pt for participation in physical therapy.  Pt agreeable to PT session. Pt's wife and daughter present.    Exercises  2x10 B LE  standing marching without UE support (CGA)      Assessment/Plan    PT Assessment Patient needs continued PT services  PT Diagnosis Difficulty walking;Generalized weakness   PT Problem List Decreased strength;Decreased activity tolerance;Decreased balance;Decreased mobility;Decreased knowledge of precautions;Cardiopulmonary status limiting activity  PT Treatment Interventions DME instruction;Gait training;Stair training;Functional mobility training;Therapeutic activities;Therapeutic exercise;Balance training;Patient/family education   PT Goals (Current goals can be found in the Care Plan section) Acute Rehab PT Goals Patient Stated Goal: to go home PT Goal Formulation: With patient Time For Goal Achievement: 08/01/15 Potential to Achieve Goals: Good    Frequency Min 2X/week   Barriers to discharge        Co-evaluation               End of Session Equipment Utilized During Treatment: Gait belt Activity Tolerance: Patient tolerated treatment well Patient left: in chair;with call bell/phone within reach;with family/visitor present Nurse Communication: Mobility status;Precautions (HR and BP)         Time: 1150-1210 PT Time Calculation (min) (ACUTE ONLY): 20 min   Charges:   PT Evaluation $PT Eval Moderate Complexity: 1 Procedure     PT G CodesHendricks Limes 07/25/15, 12:29 PM Hendricks Limes, PT [phone]

## 2015-07-18 NOTE — Progress Notes (Signed)
ANTICOAGULATION CONSULT NOTE - Initial Consult  Pharmacy Consult for Coumadin Indication: atrial fibrillation  No Known Allergies  Patient Measurements: Height:  (180.3 cm) Weight: 200 lb 9.9 oz (91 kg) IBW/kg (Calculated) : 75.3   Vital Signs: Temp: 97.9 F (36.6 C) (01/19 2000) Temp Source: Oral (01/19 2000) BP: 108/68 mmHg (01/19 2000) Pulse Rate: 67 (01/19 2000)  Labs:  Recent Labs  07/16/15 1122 07/16/15 1547 07/16/15 1922 07/17/15 0405 07/18/15 0358  HGB 10.8*  --   --  10.6* 9.9*  HCT 34.8*  --   --  32.6* 31.3*  PLT 347  --   --  330 329  LABPROT 32.5*  --   --  33.1* 36.3*  INR 3.25  --   --  3.33 3.76  CREATININE 1.52*  --   --  1.50* 1.71*  TROPONINI 0.04* 0.03 <0.03  --   --     Estimated Creatinine Clearance: 45.7 mL/min (by C-G formula based on Cr of 1.71).   Medical History: Past Medical History  Diagnosis Date  . Ventricular tachycardia ([facility]) 05/20/2014  . Atrial fibrillation and flutter ([facility])   . GI bleeding   . Gastric ulcer   . Diabetes mellitus with complication ([facility])   . Essential hypertension   . Myocardial infarction ([facility])   . CHF (congestive heart failure) ([facility])     Medications:  Prescriptions prior to admission  Medication Sig Dispense Refill Last Dose  . acetaminophen (TYLENOL) 500 MG tablet Take 1,000 mg by mouth every 6 (six) hours as needed for mild pain or headache.   07/15/2015 at 2330  . amiodarone (PACERONE) 400 MG tablet Take 1 tablet (400 mg total) by mouth daily. 30 tablet 6 07/16/2015 at Unknown time  . diltiazem (CARDIZEM CD) 120 MG 24 hr capsule Take 120 mg by mouth daily.   07/16/2015 at Unknown time  . glipiZIDE (GLUCOTROL) 10 MG tablet Take 1 tablet (10 mg total) by mouth 2 (two) times daily before a meal. 180 tablet 1 07/16/2015 at Unknown time  . hydrALAZINE (APRESOLINE) 25 MG tablet Take 50 mg by mouth daily.    07/16/2015 at Unknown time  . metoprolol tartrate (LOPRESSOR) 25 MG tablet Take 1 tablet (25 mg  total) by mouth 2 (two) times daily. 180 tablet 1 07/16/2015 at 0800  . nitroGLYCERIN (NITROSTAT) 0.4 MG SL tablet Place 1 tablet (0.4 mg total) under the tongue every 5 (five) minutes x 3 doses as needed for chest pain. 25 tablet 12 PRN at PRN  . simvastatin (ZOCOR) 10 MG tablet Take 10 mg by mouth at bedtime.   07/15/2015 at Unknown time  . valsartan-hydrochlorothiazide (DIOVAN-HCT) 320-25 MG tablet Take 1 tablet by mouth daily.   07/16/2015 at Unknown time  . warfarin (COUMADIN) 5 MG tablet Take 2.5-5 mg by mouth daily. Pt takes one tablet Monday-Friday and one-half tablet Saturday and Sunday.   07/16/2015 at 0800   Scheduled:  . amiodarone  400 mg Oral Daily  . glipiZIDE  10 mg Oral BID AC  . insulin aspart  0-15 Units Subcutaneous TID WC  . insulin aspart  0-5 Units Subcutaneous QHS  . metoprolol tartrate  50 mg Oral QID  . simvastatin  10 mg Oral QHS  . sodium chloride  3 mL Intravenous Q12H   Infusions:  . diltiazem (CARDIZEM) infusion 10 mg/hr (07/17/15 2139)    Assessment: 70 y/o M on Coumadin 5 mg daily except 2.5 mg on Saturday and Sunday admitted with afib.  Goal of Therapy:  INR 2-3   Plan:  INR remains supratherapeutic so will continue to hold Coumadin for now and f/u AM INR.   Luisa Hart D 07/18/2015,6:57 AM

## 2015-07-18 NOTE — Progress Notes (Signed)
[facility] - [place] at [facility]   PATIENT NAME: [name]    MR#:  [id number]  DATE OF BIRTH:  May 19, 1946  SUBJECTIVE:  Heart rate is still elevated this am and still on dilt gtt. Patient is doing well.  REVIEW OF SYSTEMS:  Review of Systems  Constitutional: Negative for fever and chills.  HENT: Negative for ear discharge, ear pain and nosebleeds.   Eyes: Negative for blurred vision and double vision.  Respiratory: Negative for cough, shortness of breath and wheezing.   Cardiovascular: Negative for chest pain and palpitations.  Gastrointestinal: Negative for nausea, vomiting, abdominal pain, diarrhea and constipation.  Genitourinary: Negative for dysuria and frequency.  Neurological: Negative for dizziness, sensory change, speech change, focal weakness, seizures, weakness and headaches.  Psychiatric/Behavioral: Negative for depression.    DRUG ALLERGIES:  No Known Allergies  VITALS:  Blood pressure 136/120, pulse 138, temperature 98 F (36.7 C), temperature source Oral, resp. rate 22, height  (1.803 m), weight 91 kg (200 lb 9.9 oz), SpO2 98 %.  PHYSICAL EXAMINATION:  Physical Exam  Constitutional: He is oriented to person, place, and time and well-developed, well-nourished, and in no distress. No distress.  HENT:  Head: Normocephalic.  Eyes: No scleral icterus.  Neck: Normal range of motion. Neck supple. No JVD present. No tracheal deviation present.  Cardiovascular: Normal rate, regular rhythm and normal heart sounds.  Exam reveals no gallop and no friction rub.   No murmur heard. Irr, irr tachy  Pulmonary/Chest: Effort normal and breath sounds normal. No respiratory distress. He has no wheezes. He has no rales. He exhibits no tenderness.  Abdominal: Soft. Bowel sounds are normal. He exhibits no distension and no mass. There is no tenderness. There is no rebound and no guarding.  Musculoskeletal: Normal range of motion. He exhibits  no edema.  Neurological: He is alert and oriented to person, place, and time.  Skin: Skin is warm. No rash noted. No erythema.  Psychiatric: Affect and judgment normal.       LABORATORY PANEL:   CBC  Recent Labs Lab 07/18/15 0358  WBC 12.3*  HGB 9.9*  HCT 31.3*  PLT 329   ------------------------------------------------------------------------------------------------------------------  Chemistries   Recent Labs Lab 07/13/15 0915  07/18/15 0358  NA 138  < > 134*  K 4.3  < > 4.4  CL 107  < > 103  CO2 22  < > 21*  GLUCOSE 196*  < > 269*  BUN 29*  < > 56*  CREATININE 1.57*  < > 1.71*  CALCIUM 9.0  < > 8.7*  MG  --   --  2.1  AST 23  --   --   ALT 34  --   --   ALKPHOS 55  --   --   BILITOT 0.4  --   --   < > = values in this interval not displayed. ------------------------------------------------------------------------------------------------------------------  Cardiac Enzymes  Recent Labs Lab 07/16/15 1922  TROPONINI <0.03   ------------------------------------------------------------------------------------------------------------------  RADIOLOGY:  No results found.  EKG:   Orders placed or performed during the hospital encounter of 07/16/15  . EKG 12-Lead  . EKG 12-Lead  . ED EKG within 10 minutes  . ED EKG within 10 minutes    ASSESSMENT AND PLAN:   70 year old male with past medical history significant for paroxysmal atrial fibrillation, gastric ulcer, diabetes mellitus and hypertension presents to the hospital secondary to elevated heart rate noticed at his PCPs office.  1 atrial  flutter with rapid ventricular response-heart rate persistently in 90-110 range.  Wean off Cardizem drip and transition to PO metoprolol and Oral amiodarone -Appreciate cardiology consult. Pharmacy assisting with Coumadin Echocardiogram shows diastolic dysfunction with normal ejection fraction.   2 essential hypertension-wean off diltiazem and continue oral  metoprolol. valsartan and hydrochlorothiazide are on hold for now..  3 left knee pain-. Appreciate orthopedic consult. Status post steroid injection and aspiration of fluid. No crystals noted. No significant elevation of white count to suggest infection.  4 CKD- stable at this time. Continue to monitor  5 chronic diastolic CHF- stable and continue to monitor at this time. Not on any Lasix at this time.  6 diabetes mellitus-continue glipizide and also sliding scale insulin. Sugars elevated yesterday due to steroid injection.     Management plans discussed with the patient and family and they are in agreement.  CODE STATUS: Full Code  TOTAL CRITICAL CARE TIME SPENT IN TAKING CARE OF THIS PATIENT: 28 minutes.   POSSIBLE D/C IN tomorrow, DEPENDING ON CLINICAL CONDITION.   Althea Backs M.D on 07/18/2015 at 1:19 PM  Between 7am to 6pm - Pager - [phone]  After 6pm go to [email or web address] - password [email or web address] [facility]  [place] [facility]  Office  [phone]  CC: Primary care physician; Brayton El, MD

## 2015-07-18 NOTE — Significant Event (Signed)
ICU afternoon rounds Poor glycemic control SSI changed to resistant scale with meal coverage  Jay Fischer, MD PCCM service Mobile [phone] Pager [phone]

## 2015-07-18 NOTE — Progress Notes (Signed)
Chaplain rounded in the unit and offered a compassionate presence and support to the patient and family.  Said prayer. Chaplain Clifton James [phone]

## 2015-07-19 LAB — GLUCOSE, CAPILLARY
Glucose-Capillary: 268 mg/dL — ABNORMAL HIGH (ref 65–99)
Glucose-Capillary: 264 mg/dL — ABNORMAL HIGH (ref 65–99)
Glucose-Capillary: 284 mg/dL — ABNORMAL HIGH (ref 65–99)
Glucose-Capillary: 243 mg/dL — ABNORMAL HIGH (ref 65–99)

## 2015-07-19 LAB — PROTIME-INR
INR: 3.14
PROTHROMBIN TIME: 31.7 s — AB (ref 11.4–15.0)

## 2015-07-19 NOTE — Progress Notes (Signed)
ANTICOAGULATION CONSULT NOTE - Follow Up  Pharmacy Consult for Coumadin Indication: atrial fibrillation  No Known Allergies  Patient Measurements: Height:  (180.3 cm) Weight: 200 lb 9.9 oz (91 kg) IBW/kg (Calculated) : 75.3   Vital Signs: Temp: 97.6 F (36.4 C) (01/21 0727) Temp Source: Oral (01/21 0727) BP: 134/75 mmHg (01/21 0727) Pulse Rate: 68 (01/21 0727)  Labs:  Recent Labs  07/16/15 1122 07/16/15 1547 07/16/15 1922 07/17/15 0405 07/18/15 0358 07/19/15 0602  HGB 10.8*  --   --  10.6* 9.9*  --   HCT 34.8*  --   --  32.6* 31.3*  --   PLT 347  --   --  330 329  --   LABPROT 32.5*  --   --  33.1* 36.3* 31.7*  INR 3.25  --   --  3.33 3.76 3.14  CREATININE 1.52*  --   --  1.50* 1.71*  --   TROPONINI 0.04* 0.03 <0.03  --   --   --     Estimated Creatinine Clearance: 45.7 mL/min (by C-G formula based on Cr of 1.71).   Medical History: Past Medical History  Diagnosis Date  . Ventricular tachycardia (HCC) 05/20/2014  . Atrial fibrillation and flutter (HCC)   . GI bleeding   . Gastric ulcer   . Diabetes mellitus with complication (HCC)   . Essential hypertension   . Myocardial infarction (HCC)   . CHF (congestive heart failure) (HCC)     Medications:  Prescriptions prior to admission  Medication Sig Dispense Refill Last Dose  . acetaminophen (TYLENOL) 500 MG tablet Take 1,000 mg by mouth every 6 (six) hours as needed for mild pain or headache.   07/15/2015 at 2330  . amiodarone (PACERONE) 400 MG tablet Take 1 tablet (400 mg total) by mouth daily. 30 tablet 6 07/16/2015 at Unknown time  . diltiazem (CARDIZEM CD) 120 MG 24 hr capsule Take 120 mg by mouth daily.   07/16/2015 at Unknown time  . glipiZIDE (GLUCOTROL) 10 MG tablet Take 1 tablet (10 mg total) by mouth 2 (two) times daily before a meal. 180 tablet 1 07/16/2015 at Unknown time  . hydrALAZINE (APRESOLINE) 25 MG tablet Take 50 mg by mouth daily.    07/16/2015 at Unknown time  . metoprolol tartrate  (LOPRESSOR) 25 MG tablet Take 1 tablet (25 mg total) by mouth 2 (two) times daily. 180 tablet 1 07/16/2015 at 0800  . nitroGLYCERIN (NITROSTAT) 0.4 MG SL tablet Place 1 tablet (0.4 mg total) under the tongue every 5 (five) minutes x 3 doses as needed for chest pain. 25 tablet 12 PRN at PRN  . simvastatin (ZOCOR) 10 MG tablet Take 10 mg by mouth at bedtime.   07/15/2015 at Unknown time  . valsartan-hydrochlorothiazide (DIOVAN-HCT) 320-25 MG tablet Take 1 tablet by mouth daily.   07/16/2015 at Unknown time  . warfarin (COUMADIN) 5 MG tablet Take 2.5-5 mg by mouth daily. Pt takes one tablet Monday-Friday and one-half tablet Saturday and Sunday.   07/16/2015 at 0800   Scheduled:  . amiodarone  400 mg Oral Daily  . glipiZIDE  10 mg Oral BID AC  . insulin aspart  0-20 Units Subcutaneous TID WC  . insulin aspart  0-5 Units Subcutaneous QHS  . insulin aspart  4 Units Subcutaneous TID WC  . metoprolol tartrate  50 mg Oral QID  . simvastatin  10 mg Oral QHS   Infusions:  . diltiazem (CARDIZEM) infusion 10 mg/hr (07/19/15 0453)  Assessment: 70 y/o M on Coumadin 5 mg daily except 2.5 mg on Saturday and Sunday admitted with afib.   Goal of Therapy:  INR 2-3   Plan:  INR remains supratherapeutic so will continue to hold Coumadin for now and f/u AM INR.   Stormy Card, RPh Clinical Pharmacist 07/19/2015,8:50 AM

## 2015-07-19 NOTE — Progress Notes (Addendum)
[facility] - [place] at [facility]   PATIENT NAME: [name]    MR#:  [id number]  DATE OF BIRTH:  Jun 08, 1946  SUBJECTIVE:  Patient is still on diltiazem drip. Heart rates are better controlled. Patient's heart rates are in the 60s. Patient's knee pain is improved.  REVIEW OF SYSTEMS:  Review of Systems  Constitutional: Negative for fever and chills.  HENT: Negative for ear discharge, ear pain and nosebleeds.   Eyes: Negative for blurred vision and double vision.  Respiratory: Negative for cough, shortness of breath and wheezing.   Cardiovascular: Negative for chest pain and palpitations.  Gastrointestinal: Negative for nausea, vomiting, abdominal pain, diarrhea and constipation.  Genitourinary: Negative for dysuria and frequency.  Neurological: Negative for dizziness, sensory change, speech change, focal weakness, seizures, weakness and headaches.  Psychiatric/Behavioral: Negative for depression.    DRUG ALLERGIES:  No Known Allergies  VITALS:  Blood pressure 134/75, pulse 68, temperature 97.6 F (36.4 C), temperature source Oral, resp. rate 16, height  (1.803 m), weight 91 kg (200 lb 9.9 oz), SpO2 96 %.  PHYSICAL EXAMINATION:  Physical Exam  Constitutional: He is oriented to person, place, and time and well-developed, well-nourished, and in no distress. No distress.  HENT:  Head: Normocephalic.  Eyes: No scleral icterus.  Neck: Normal range of motion. Neck supple. No JVD present. No tracheal deviation present.  Cardiovascular: Normal rate, regular rhythm and normal heart sounds.  Exam reveals no gallop and no friction rub.   No murmur heard. Irr, irr   Pulmonary/Chest: Effort normal and breath sounds normal. No respiratory distress. He has no wheezes. He has no rales. He exhibits no tenderness.  Abdominal: Soft. Bowel sounds are normal. He exhibits no distension and no mass. There is no tenderness. There is no rebound and no guarding.   Musculoskeletal: Normal range of motion. He exhibits no edema.  Neurological: He is alert and oriented to person, place, and time.  Skin: Skin is warm. No rash noted. No erythema.  Psychiatric: Affect and judgment normal.       LABORATORY PANEL:   CBC  Recent Labs Lab 07/18/15 0358  WBC 12.3*  HGB 9.9*  HCT 31.3*  PLT 329   ------------------------------------------------------------------------------------------------------------------  Chemistries   Recent Labs Lab 07/13/15 0915  07/18/15 0358  NA 138  < > 134*  K 4.3  < > 4.4  CL 107  < > 103  CO2 22  < > 21*  GLUCOSE 196*  < > 269*  BUN 29*  < > 56*  CREATININE 1.57*  < > 1.71*  CALCIUM 9.0  < > 8.7*  MG  --   --  2.1  AST 23  --   --   ALT 34  --   --   ALKPHOS 55  --   --   BILITOT 0.4  --   --   < > = values in this interval not displayed. ------------------------------------------------------------------------------------------------------------------  Cardiac Enzymes  Recent Labs Lab 07/16/15 1922  TROPONINI <0.03   ------------------------------------------------------------------------------------------------------------------  RADIOLOGY:  No results found.  EKG:     ASSESSMENT AND PLAN:   70 year old male with past medical history significant for paroxysmal atrial fibrillation, gastric ulcer, diabetes mellitus and hypertension presents to the hospital secondary to elevated heart rate noticed at his PCPs office.  1 atrial flutter/fibrillation with rapid ventricular response-heart rate persistently in 90-110 range.  Plan to Wean off Cardizem drip today and continue him PO metoprolol and Oral amiodarone -  Appreciate cardiology consult. Pharmacy assisting with Coumadin.  Echocardiogram shows diastolic dysfunction with normal ejection fraction. Plan to change metoprolol tomorrow to once a day or twice a day dosing.  2 essential hypertension-wean off diltiazem and continue oral  metoprolol. valsartan and hydrochlorothiazide are on hold for now..  3 left knee pain-. Appreciate orthopedic consult. Status post steroid injection and aspiration of fluid. No crystals noted. No significant elevation of white count to suggest infection.  4 CKD- stable at this time. Continue to monitor  5 chronic diastolic CHF- stable and continue to monitor at this time. Not on any Lasix at this time.  6 diabetes mellitus-continue glipizide and also sliding scale insulin. Sugars elevated yesterday due to steroid injection.   7. Anemia of chronic kidney disease: Hemoglobin remained relatively stable.  Management plans discussed with the patient and he is in agreement.  CODE STATUS: Full Code  TOTAL CRITICAL CARE TIME SPENT IN TAKING CARE OF THIS PATIENT: 30 minutes.  Still managing a fib with dilt gtt POSSIBLE D/C IN tomorrow, DEPENDING ON CLINICAL CONDITION.   Nita Whitmire M.D on 07/19/2015 at 10:32 AM  Between 7am to 6pm - Pager - [phone]  After 6pm go to [email or web address] - password [email or web address] [facility]  [place] [facility]  Office  [phone]  CC: Primary care physician; Brayton El, MD

## 2015-07-19 NOTE — Progress Notes (Signed)
Diltiazem gtt /hr infusing well rt arm. Denies chest pain or shortness of breath.  CB in reach, SR up x 2.

## 2015-07-19 NOTE — Plan of Care (Signed)
Problem: Pain Managment: Goal: General experience of comfort will improve Outcome: Progressing Prn medications        

## 2015-07-19 NOTE — Progress Notes (Signed)
Diltiazem gtt decreased to /hr, will continue to monitor.

## 2015-07-19 NOTE — Progress Notes (Signed)
Diltiazem gtt stopped per md orders, will monitor patients heart rate.

## 2015-07-20 LAB — CBC
HEMATOCRIT: 32.9 % — AB (ref 40.0–52.0)
HEMOGLOBIN: 10.4 g/dL — AB (ref 13.0–18.0)
MCH: 22 pg — ABNORMAL LOW (ref 26.0–34.0)
MCHC: 31.5 g/dL — AB (ref 32.0–36.0)
MCV: 69.8 fL — ABNORMAL LOW (ref 80.0–100.0)
Platelets: 363 10*3/uL (ref 150–440)
RBC: 4.71 MIL/uL (ref 4.40–5.90)
RDW: 14.3 % (ref 11.5–14.5)
WBC: 11.7 10*3/uL — AB (ref 3.8–10.6)

## 2015-07-20 LAB — PROTIME-INR
INR: 2.21
Prothrombin Time: 24.3 seconds — ABNORMAL HIGH (ref 11.4–15.0)

## 2015-07-20 LAB — GLUCOSE, CAPILLARY
GLUCOSE-CAPILLARY: 211 mg/dL — AB (ref 65–99)
GLUCOSE-CAPILLARY: 250 mg/dL — AB (ref 65–99)
Glucose-Capillary: 224 mg/dL — ABNORMAL HIGH (ref 65–99)
Glucose-Capillary: 192 mg/dL — ABNORMAL HIGH (ref 65–99)

## 2015-07-20 LAB — BASIC METABOLIC PANEL
ANION GAP: 4 — AB (ref 5–15)
BUN: 47 mg/dL — ABNORMAL HIGH (ref 6–20)
CALCIUM: 8.7 mg/dL — AB (ref 8.9–10.3)
CO2: 23 mmol/L (ref 22–32)
Chloride: 107 mmol/L (ref 101–111)
Creatinine, Ser: 1.42 mg/dL — ABNORMAL HIGH (ref 0.61–1.24)
GFR, EST AFRICAN AMERICAN: 56 mL/min — AB (ref >60)
GFR, EST NON AFRICAN AMERICAN: 48 mL/min — AB (ref >60)
GLUCOSE: 228 mg/dL — AB (ref 65–99)
POTASSIUM: 4.5 mmol/L (ref 3.5–5.1)
Sodium: 134 mmol/L — ABNORMAL LOW (ref 135–145)

## 2015-07-20 LAB — BODY FLUID CULTURE: CULTURE: NO GROWTH

## 2015-07-20 MED ORDER — DILTIAZEM HCL 60 MG PO TABS
60.0000 mg | ORAL_TABLET | Freq: Four times a day (QID) | ORAL | Status: DC
  Administered 2015-07-20 – 2015-07-21 (×6): 60 mg via ORAL
  Filled 2015-07-20 (×6): qty 1

## 2015-07-20 MED ORDER — WARFARIN SODIUM 2.5 MG PO TABS
2.5000 mg | ORAL_TABLET | Freq: Once | ORAL | Status: AC
  Administered 2015-07-20: 2.5 mg via ORAL
  Filled 2015-07-20: qty 1

## 2015-07-20 NOTE — Progress Notes (Signed)
[facility] -  at [facility]   PATIENT NAME: [name]    MR#:  [id number]  DATE OF BIRTH:  [birth year]  SUBJECTIVE:  Patient's heart rate were elevated yesterday he was started on diltiazem. Heart rate is still in the low 100s  REVIEW OF SYSTEMS:  Review of Systems  Constitutional: Negative for fever and chills.  HENT: Negative for ear discharge, ear pain and nosebleeds.   Eyes: Negative for blurred vision and double vision.  Respiratory: Negative for cough, shortness of breath and wheezing.   Cardiovascular: Negative for chest pain and palpitations.  Gastrointestinal: Negative for nausea, vomiting, abdominal pain, diarrhea and constipation.  Genitourinary: Negative for dysuria and frequency.  Neurological: Negative for dizziness, sensory change, speech change, focal weakness, seizures, weakness and headaches.  Psychiatric/Behavioral: Negative for depression.    DRUG ALLERGIES:  No Known Allergies  VITALS:  Blood pressure 130/57, pulse 94, temperature 97.8 F (36.6 C), temperature source Oral, resp. rate 22, height  (1.803 m), weight 91 kg (200 lb 9.9 oz), SpO2 99 %.  PHYSICAL EXAMINATION:  Physical Exam  Constitutional: He is oriented to person, place, and time and well-developed, well-nourished, and in no distress. No distress.  HENT:  Head: Normocephalic.  Eyes: No scleral icterus.  Neck: Normal range of motion. Neck supple. No JVD present. No tracheal deviation present.  Cardiovascular: Normal rate, regular rhythm and normal heart sounds.  Exam reveals no gallop and no friction rub.   No murmur heard. Irr, irr   Pulmonary/Chest: Effort normal and breath sounds normal. No respiratory distress. He has no wheezes. He has no rales. He exhibits no tenderness.  Abdominal: Soft. Bowel sounds are normal. He exhibits no distension and no mass. There is no tenderness. There is no rebound and no guarding.  Musculoskeletal: Normal range of  motion. He exhibits no edema.  Neurological: He is alert and oriented to person, place, and time.  Skin: Skin is warm. No rash noted. No erythema.  Psychiatric: Affect and judgment normal.       LABORATORY PANEL:   CBC  Recent Labs Lab 07/20/15 0609  WBC 11.7*  HGB 10.4*  HCT 32.9*  PLT 363   ------------------------------------------------------------------------------------------------------------------  Chemistries   Recent Labs Lab 07/18/15 0358 07/20/15 0609  NA 134* 134*  K 4.4 4.5  CL 103 107  CO2 21* 23  GLUCOSE 269* 228*  BUN 56* 47*  CREATININE 1.71* 1.42*  CALCIUM 8.7* 8.7*  MG 2.1  --    ------------------------------------------------------------------------------------------------------------------  Cardiac Enzymes  Recent Labs Lab 07/16/15 1922  TROPONINI <0.03   ------------------------------------------------------------------------------------------------------------------  RADIOLOGY:  No results found.  EKG:     ASSESSMENT AND PLAN:   70 year old male with past medical history significant for paroxysmal atrial fibrillation, gastric ulcer, diabetes mellitus and hypertension presents to the hospital secondary to elevated heart rate noticed at his PCPs office.  1 atrial flutter/fibrillation with rapid ventricular response-heart rate is still not controlled. Patient was weaned off of diltiazem drip. Patient will continue metoprolol and diltiazem. We will need to consolidate both these medications to a daily or twice a day dosing. I spoke with cardiology who will see patient today about consolidating medications. Patient should continue amiodarone Pharmacy assisting with Coumadin.  Echocardiogram shows diastolic dysfunction with normal ejection fraction.   2 essential hypertension-continue diltiazem and metoprolol...  3 left knee pain-. Appreciate orthopedic consult. Status post steroid injection and aspiration of fluid. No crystals  noted. No significant elevation of white count  to suggest infection. Left knee pain has improved. 4 CKD- stable at this time. Continue to monitor  5 chronic diastolic CHF- stable and continue to monitor at this time. Not on any Lasix at this time.  6 diabetes mellitus-continue glipizide and also sliding scale insulin. Sugars elevated yesterday due to steroid injection.   7. Anemia of chronic kidney disease: Hemoglobin remained relatively stable.  Management plans discussed with the patient and he is in agreement.  CODE STATUS: Full Code   TIME SPENT IN TAKING CARE OF THIS PATIENT: 29 minutes.    POSSIBLE D/C  tomorrow, DEPENDING ON CLINICAL CONDITION.   Hazley Dezeeuw M.D on 07/20/2015 at 10:21 AM  Between 7am to 6pm - Pager - [phone]  After 6pm go to [email or web address] - password [email or web address] [facility]  [place] [facility]  Office  [phone]  CC: Primary care physician; Brayton El, MD

## 2015-07-20 NOTE — Progress Notes (Signed)
Subjective:   Known atrial fibrillation atrial flutter currently on amiodarone has had problems with tachycardia so we have made some adjustments of Cardizem and metoprolol. Patient feels reasonably stable will hopefully be able to discharge patient soon on rate control with metoprolol Cardizem will consider  Digoxin if further rate control as necessary. Increase ambulation switch so was medicines to p.o.. Patient feels ready to go home  Objective:  Vital Signs in the last 24 hours: Temp:  [97.7 F (36.5 C)-98.1 F (36.7 C)] 97.7 F (36.5 C) (01/22 1122) Pulse Rate:  [45-135] 46 (01/22 1122) Resp:  [18-22] 18 (01/22 1122) BP: (127-151)/(57-90) 128/65 mmHg (01/22 1122) SpO2:  [99 %-100 %] 100 % (01/22 1122)  Intake/Output from previous day: 01/21 0701 - 01/22 0700 In: 631.2 [P.O.:600; I.V.:31.2] Out: 1750 [Urine:1750] Intake/Output from this shift: Total I/O In: 480 [P.O.:480] Out: 0   Physical Exam: General appearance: cooperative and appears stated age Neck: no adenopathy, no carotid bruit, no JVD, supple, symmetrical, trachea midline and thyroid not enlarged, symmetric, no tenderness/mass/nodules Lungs: clear to auscultation bilaterally Heart: irregularly irregular rhythm Abdomen: soft, non-tender; bowel sounds normal; no masses,  no organomegaly Extremities: extremities normal, atraumatic, no cyanosis or edema Pulses: 2+ and symmetric Skin: Skin color, texture, turgor normal. No rashes or lesions Neurologic: Alert and oriented X 3, normal strength and tone. Normal symmetric reflexes. Normal coordination and gait  Lab Results:  Recent Labs  07/18/15 0358 07/20/15 0609  WBC 12.3* 11.7*  HGB 9.9* 10.4*  PLT 329 363    Recent Labs  07/18/15 0358 07/20/15 0609  NA 134* 134*  K 4.4 4.5  CL 103 107  CO2 21* 23  GLUCOSE 269* 228*  BUN 56* 47*  CREATININE 1.71* 1.42*   No results for input(s): TROPONINI in the last 72 hours.  Invalid input(s): CK, MB Hepatic  Function Panel No results for input(s): PROT, ALBUMIN, AST, ALT, ALKPHOS, BILITOT, BILIDIR, IBILI in the last 72 hours. No results for input(s): CHOL in the last 72 hours. No results for input(s): PROTIME in the last 72 hours.  Imaging: Imaging results have been reviewed  Cardiac Studies:  Assessment/Plan:   atrial fibrillation  hypertension  chronic renal insufficiency stage III  hyperlipidemia  diabetes type 2, uncomplicated  dyspnea  tachycardia  anemia . PLAN  recommend aggressive rate control  continue amiodarone 400 mg a day  switch Cardizem  CD once a day   recommend metoprolol tartrate 100 mg twice a day  if heart rate still up would recommend low-dose digoxin  our goal is to have this heart rate of about 80 or 90 at rest  continue hypertension control with Cardizem metoprolol  agree with diabetes management with glipizide insulin  continue simvastatin for hyperlipidemia  maintain anticoagulation with Coumadin  outpatient follow-up for atrial fibrillation atrial flutter  LOS: 4 days    CALLWOOD,DWAYNE D. 07/20/2015, 2:30 PM

## 2015-07-20 NOTE — Progress Notes (Signed)
Episode of heart rate trending up and sustained >120 this shift.  Atrial flutter continues. MD made aware.. Cardizem po initiated with rates to normal afterwards.

## 2015-07-20 NOTE — Progress Notes (Signed)
ANTICOAGULATION CONSULT NOTE - Follow Up  Pharmacy Consult for Coumadin Indication: atrial fibrillation  No Known Allergies  Patient Measurements: Height:  (180.3 cm) Weight: 200 lb 9.9 oz (91 kg) IBW/kg (Calculated) : 75.3   Vital Signs: Temp: 97.8 F (36.6 C) (01/22 0426) Temp Source: Oral (01/22 0426) BP: 130/57 mmHg (01/22 0426) Pulse Rate: 94 (01/22 0619)  Labs:  Recent Labs  07/18/15 0358 07/19/15 0602 07/20/15 0609  HGB 9.9*  --  10.4*  HCT 31.3*  --  32.9*  PLT 329  --  363  LABPROT 36.3* 31.7* 24.3*  INR 3.76 3.14 2.21  CREATININE 1.71*  --  1.42*    Estimated Creatinine Clearance: 55.1 mL/min (by C-G formula based on Cr of 1.42).   Medical History: Past Medical History  Diagnosis Date  . Ventricular tachycardia (HCC) 05/20/2014  . Atrial fibrillation and flutter (HCC)   . GI bleeding   . Gastric ulcer   . Diabetes mellitus with complication (HCC)   . Essential hypertension   . Myocardial infarction (HCC)   . CHF (congestive heart failure) (HCC)     Medications:  Prescriptions prior to admission  Medication Sig Dispense Refill Last Dose  . acetaminophen (TYLENOL) 500 MG tablet Take 1,000 mg by mouth every 6 (six) hours as needed for mild pain or headache.   07/15/2015 at 2330  . amiodarone (PACERONE) 400 MG tablet Take 1 tablet (400 mg total) by mouth daily. 30 tablet 6 07/16/2015 at Unknown time  . diltiazem (CARDIZEM CD) 120 MG 24 hr capsule Take 120 mg by mouth daily.   07/16/2015 at Unknown time  . glipiZIDE (GLUCOTROL) 10 MG tablet Take 1 tablet (10 mg total) by mouth 2 (two) times daily before a meal. 180 tablet 1 07/16/2015 at Unknown time  . hydrALAZINE (APRESOLINE) 25 MG tablet Take 50 mg by mouth daily.    07/16/2015 at Unknown time  . metoprolol tartrate (LOPRESSOR) 25 MG tablet Take 1 tablet (25 mg total) by mouth 2 (two) times daily. 180 tablet 1 07/16/2015 at 0800  . nitroGLYCERIN (NITROSTAT) 0.4 MG SL tablet Place 1 tablet (0.4 mg  total) under the tongue every 5 (five) minutes x 3 doses as needed for chest pain. 25 tablet 12 PRN at PRN  . simvastatin (ZOCOR) 10 MG tablet Take 10 mg by mouth at bedtime.   07/15/2015 at Unknown time  . valsartan-hydrochlorothiazide (DIOVAN-HCT) 320-25 MG tablet Take 1 tablet by mouth daily.   07/16/2015 at Unknown time  . warfarin (COUMADIN) 5 MG tablet Take 2.5-5 mg by mouth daily. Pt takes one tablet Monday-Friday and one-half tablet Saturday and Sunday.   07/16/2015 at 0800   Scheduled:  . amiodarone  400 mg Oral Daily  . diltiazem  60 mg Oral 4 times per day  . glipiZIDE  10 mg Oral BID AC  . insulin aspart  0-20 Units Subcutaneous TID WC  . insulin aspart  0-5 Units Subcutaneous QHS  . insulin aspart  4 Units Subcutaneous TID WC  . metoprolol tartrate  50 mg Oral QID  . simvastatin  10 mg Oral QHS  . warfarin  2.5 mg Oral ONCE-1800   Infusions:     Assessment: 70 y/o M on Coumadin 5 mg daily except 2.5 mg on Saturday and Sunday admitted with afib.   Goal of Therapy:  INR 2-3   Plan:  INR is within therapeutic range. Will order Coumadin 2.5mg  for tonight.  INR ordered with AM labs.  Will  determine further dosing needs based on level.  Stormy Card, RPh Clinical Pharmacist 07/20/2015,8:46 AM

## 2015-07-21 ENCOUNTER — Encounter: Payer: Self-pay | Admitting: *Deleted

## 2015-07-21 LAB — PROTIME-INR
INR: 1.66
Prothrombin Time: 19.6 seconds — ABNORMAL HIGH (ref 11.4–15.0)

## 2015-07-21 LAB — GLUCOSE, CAPILLARY
GLUCOSE-CAPILLARY: 217 mg/dL — AB (ref 65–99)
GLUCOSE-CAPILLARY: 291 mg/dL — AB (ref 65–99)

## 2015-07-21 MED ORDER — OXYCODONE-ACETAMINOPHEN 5-325 MG PO TABS: 1.0000 | ORAL_TABLET | Freq: Four times a day (QID) | ORAL | Status: DC | PRN

## 2015-07-21 MED ORDER — WARFARIN - PHARMACIST DOSING INPATIENT: Freq: Every day | Status: DC

## 2015-07-21 MED ORDER — METOPROLOL TARTRATE 25 MG PO TABS: 25.0000 mg | ORAL_TABLET | Freq: Two times a day (BID) | ORAL | Status: DC

## 2015-07-21 MED ORDER — DILTIAZEM HCL ER COATED BEADS 240 MG PO CP24
240.0000 mg | ORAL_CAPSULE | Freq: Every day | ORAL | Status: DC
  Administered 2015-07-21: 240 mg via ORAL
  Filled 2015-07-21: qty 1

## 2015-07-21 MED ORDER — DILTIAZEM HCL ER COATED BEADS 240 MG PO CP24: 240.0000 mg | ORAL_CAPSULE | Freq: Every day | ORAL | Status: DC

## 2015-07-21 MED ORDER — METOPROLOL SUCCINATE ER 50 MG PO TB24: 50.0000 mg | ORAL_TABLET | Freq: Every day | ORAL | Status: DC

## 2015-07-21 MED ORDER — METOPROLOL TARTRATE 50 MG PO TABS
50.0000 mg | ORAL_TABLET | Freq: Two times a day (BID) | ORAL | Status: DC
  Administered 2015-07-21: 50 mg via ORAL
  Filled 2015-07-21: qty 1

## 2015-07-21 MED ORDER — WARFARIN SODIUM 5 MG PO TABS: 5.0000 mg | ORAL_TABLET | Freq: Once | ORAL | Status: DC

## 2015-07-21 NOTE — Progress Notes (Signed)
Discharge instructions given to patient and family. Prescriptions given and education on afib, heart failure, and cardizem. No questions at this time. IV's and tele removed.

## 2015-07-21 NOTE — Care Management Important Message (Signed)
Important Message  Patient Details  Name: [name] MRN: [id number] Date of Birth: 1945/09/05   Medicare Important Message Given:  Yes    Shantel Wesely A, RN 07/21/2015, 8:33 AM

## 2015-07-21 NOTE — Discharge Summary (Signed)
[facility] [place] at [facility]   PATIENT NAME: [name]    MR#:  [id number]  DATE OF BIRTH:  March 30, 1946  DATE OF ADMISSION:  07/16/2015 ADMITTING PHYSICIAN: Alford Highland, MD  DATE OF DISCHARGE: 07/21/2015  PRIMARY CARE PHYSICIAN: Brayton El, MD    ADMISSION DIAGNOSIS:  Atrial flutter with rapid ventricular response (HCC) [I48.92]  DISCHARGE DIAGNOSIS:  Active Problems:   Atrial fibrillation (HCC)   SECONDARY DIAGNOSIS:   Past Medical History  Diagnosis Date  . Ventricular tachycardia (HCC) 05/20/2014  . Atrial fibrillation and flutter (HCC)   . GI bleeding   . Gastric ulcer   . Diabetes mellitus with complication (HCC)   . Essential hypertension   . Myocardial infarction (HCC)   . CHF (congestive heart failure) [facility])     HOSPITAL COURSE:   70 year old male with past medical history significant for paroxysmal atrial fibrillation, gastric ulcer, diabetes mellitus and hypertension presents to the hospital secondary to elevated heart rate noticed at his PCPs office.  1 atrial flutter/fibrillation with rapid ventricular response-heart rate is still not controlled. Patient was weaned off of diltiazem drip. He will continue metoprolol and diltiazem. Diltiazem was increased to 240 mg daily. He will continue his current dose of Toprol. Amiodarone was continued at 400 mg daily. He will need close follow-up with cardiology. Follow-up appointment is made for the end of the week. Pharmacy assistant with Coumadin dosing. Echocardiogram shows diastolic dysfunction with normal ejection fraction.   2 essential hypertension-continue diltiazem and metoprolol and Diovan/HCT. Since I increased diltiazem I discontinue hydralazine for blood pressure. He'll need close follow-up with his cardiologist for his blood pressure.  3 left knee pain-. Appreciate orthopedic consult. Status post steroid injection and aspiration of fluid. No crystals noted. No  significant elevation of white count to suggest infection. Left knee pain has improved, but he is still requiring pain medications.   4 CKD- stable at this time. Continue to monitor  5 chronic diastolic CHF- stable and continue to monitor at this time. Not on any Lasix at this time.  6 diabetes mellitus-patient may resume his outpatient medications including glipizide. Continue ADA diet.   7. Anemia of chronic kidney disease: Hemoglobin remained  stable.   DISCHARGE CONDITIONS AND DIET:   Patient is stable for discharge home on a heart healthy/diabetic diet  CONSULTS OBTAINED:  Treatment Team:  Alford Highland, MD Marcina Millard, MD Christena Flake, MD Lamar Blinks, MD  DRUG ALLERGIES:  No Known Allergies  DISCHARGE MEDICATIONS:   Current Discharge Medication List    START taking these medications   Details  oxyCODONE-acetaminophen (ROXICET) 5-325 MG tablet Take 1 tablet by mouth every 6 (six) hours as needed for moderate pain or severe pain. Qty: 30 tablet, Refills: 0      CONTINUE these medications which have CHANGED   Details  diltiazem (CARDIZEM CD) 240 MG 24 hr capsule Take 1 capsule (240 mg total) by mouth daily. Qty: 30 capsule, Refills: 0      CONTINUE these medications which have NOT CHANGED   Details  acetaminophen (TYLENOL) 500 MG tablet Take 1,000 mg by mouth every 6 (six) hours as needed for mild pain or headache.    amiodarone (PACERONE) 400 MG tablet Take 1 tablet (400 mg total) by mouth daily. Qty: 30 tablet, Refills: 6    glipiZIDE (GLUCOTROL) 10 MG tablet Take 1 tablet (10 mg total) by mouth 2 (two) times daily before a meal. Qty: 180 tablet, Refills:  1   Associated Diagnoses: Diabetes mellitus type 2, uncontrolled, with complications (HCC)    metoprolol tartrate (LOPRESSOR) 25 MG tablet Take 1 tablet (25 mg total) by mouth 2 (two) times daily. Qty: 180 tablet, Refills: 1   Associated Diagnoses: Essential hypertension    nitroGLYCERIN  (NITROSTAT) 0.4 MG SL tablet Place 1 tablet (0.4 mg total) under the tongue every 5 (five) minutes x 3 doses as needed for chest pain. Qty: 25 tablet, Refills: 12    simvastatin (ZOCOR) 10 MG tablet Take 10 mg by mouth at bedtime.    valsartan-hydrochlorothiazide (DIOVAN-HCT) 320-25 MG tablet Take 1 tablet by mouth daily.    warfarin (COUMADIN) 5 MG tablet Take 2.5-5 mg by mouth daily. Pt takes one tablet Monday-Friday and one-half tablet Saturday and /18/17 1258    Code Status History  Date Active Date Inactive Code Status Order ID Comments User Context   04/16/2015  8:16 PM 04/17/2015  5:07 PM Full Code [id number]  Ramonita Lab, MD Inpatient   05/20/2014  6:07 PM 05/27/2014  2:23 PM Full Code [id number]  Tonny Bollman, MD Inpatient   05/20/2014  6:07 PM 05/20/2014  6:07 PM Full Code [id number]  Tonny Bollman, MD Inpatient    Advance Directive Documentation        Most Recent Value   Type of Advance Directive  Living will   Pre-existing out of facility DNR order (yellow form or pink MOST form)     "MOST" Form in Place?        TOTAL TIME TAKING CARE OF THIS PATIENT: 35 minutes.    Note: This dictation was prepared with Dragon dictation along with smaller phrase technology. Any transcriptional errors that result from this process are unintentional.  Daisa Stennis M.D on 07/21/2015 at 11:31 AM  Between 7am to 6pm - Pager - [phone] After 6pm go to [email or web address] -  password [email or web address] [facility]  [place] [facility]  Office  [phone]  CC: Primary care physician; Brayton El, MD

## 2015-07-21 NOTE — Care Management Note (Addendum)
Case Management Note  Patient Details  Name: MERVIN RAMIRES MRN: [id number] Date of Birth: 1945/08/24  Subjective/Objective:       Discussed discharge planning with Mr Gentle and his wife. They chose [facility] to be their home health provider from a list of local providers. Feliberto Gottron at [facility] was updated of this referral to [facility] for RN .            Action/Plan:   Expected Discharge Date:                  Expected Discharge Plan:     In-House Referral:     Discharge planning Services     Post Acute Care Choice:    Choice offered to:     DME Arranged:    DME Agency:     HH Arranged:    HH Agency:     Status of Service:     Medicare Important Message Given:  Yes Date Medicare IM Given:    Medicare IM give by:    Date Additional Medicare IM Given:    Additional Medicare Important Message give by:     If discussed at Long Length of Stay Meetings, dates discussed:    Additional Comments:  Colbert Curenton A, RN 07/21/2015, 10:06 AM

## 2015-07-21 NOTE — Consult Note (Signed)
   [facility] Inpatient Consult   07/21/2015  [name] 1946-01-20 [id number]   Met with the patient at bedside to offer [place] [facility] services as benefit of [facility] insurance. Patient agreeable to services and will receive post hospital discharge call and will be evaluated for monthly home visits.  Patient's best contact number is [phone]. Provided patient with [facility] information packet, including [employer] nondiscrimination and accessibility statement.  Of note, [facility] Care Management services does not replace or interfere with any services that are arranged by inpatient case management or social work. For additional questions or referrals please contact:  Joylene Draft, RN, [place] [facility]/Hospital Liaison [phone]- Mobile [phone]- [place]

## 2015-07-21 NOTE — Progress Notes (Signed)
Inpatient Diabetes Program Recommendations  AACE/ADA: New Consensus Statement on Inpatient Glycemic Control (2015)  Target Ranges:  Prepandial:   less than 140 mg/dL      Peak postprandial:   less than 180 mg/dL (1-2 hours)      Critically ill patients:  140 - 180 mg/dL  Results for MARQUAY, KRUSE (MRN [id number]) as of 07/21/2015 10:37  Ref. Range 07/20/2015 07:34 07/20/2015 11:20 07/20/2015 16:32 07/20/2015 21:11 07/21/2015 07:42  Glucose-Capillary Latest Ref Range: 65-99 mg/dL 409 (H) 811 (H) 914 (H) 192 (H) 217 (H)   Review of Glycemic Control  Diabetes history: DM2 Outpatient Diabetes medications: Glucotrol 10 mg BID Current orders for Inpatient glycemic control: Glucotrol 10 mg BID, Novolog 0-20 units TID, Novolog 0-5 units QHS, Novolog 4 units TID with meals for meal coverage  Inpatient Diabetes Program Recommendations: Insulin - Basal: Fasting glucose 211 mg/dl on 7/82/95 and 621 mg/dl today. Glucose has ranged from 192-250 mg/dl over the past 24 hours and patient received a total of Novolog 33 units (21 units for correction and 12 units for meal coverage) on 07/20/15. Please consider ordering low dose basal insulin. Recommend starting with Levemir 9 units Q24H starting now (based on 91 kg x 0.1 units).  Thanks, Orlando Penner, RN, MSN, CDE Diabetes Coordinator Inpatient Diabetes Program [phone] (Team Pager from 8am to 5pm) [phone] ([facility] office) [phone] [facility] office) [phone] [facility] office)

## 2015-07-21 NOTE — Progress Notes (Signed)
ANTICOAGULATION CONSULT NOTE - Follow Up  Pharmacy Consult for Warfarin Indication: atrial fibrillation  No Known Allergies  Patient Measurements: Height:  (180.3 cm) Weight: 200 lb 9.9 oz (91 kg) IBW/kg (Calculated) : 75.3  Vital Signs: Temp: 98.1 F (36.7 C) (01/23 0850) Temp Source: Oral (01/23 0850) BP: 149/63 mmHg (01/23 0850) Pulse Rate: 71 (01/23 0850)  Labs:  Recent Labs  07/19/15 0602 07/20/15 0609 07/21/15 0417  HGB  --  10.4*  --   HCT  --  32.9*  --   PLT  --  363  --   LABPROT 31.7* 24.3* 19.6*  INR 3.14 2.21 1.66  CREATININE  --  1.42*  --     Estimated Creatinine Clearance: 55.1 mL/min (by C-G formula based on Cr of 1.42).   Medical History: Past Medical History  Diagnosis Date  . Ventricular tachycardia (HCC) 05/20/2014  . Atrial fibrillation and flutter (HCC)   . GI bleeding   . Gastric ulcer   . Diabetes mellitus with complication (HCC)   . Essential hypertension   . Myocardial infarction (HCC)   . CHF (congestive heart failure) (HCC)     Scheduled:  . amiodarone  400 mg Oral Daily  . diltiazem  240 mg Oral Daily  . glipiZIDE  10 mg Oral BID AC  . insulin aspart  0-20 Units Subcutaneous TID WC  . insulin aspart  0-5 Units Subcutaneous QHS  . insulin aspart  4 Units Subcutaneous TID WC  . metoprolol tartrate  25 mg Oral BID  . simvastatin  10 mg Oral QHS  . warfarin  5 mg Oral ONCE-1800  . Warfarin - Pharmacist Dosing Inpatient   Does not apply q1800   Assessment: Pharmacy dosing and monitoring warfarin in this 70 year old male admitted for atrial fibrillation. Patient was taking warfarin prior to admission on the following regimen: 5 mg daily M-F & 2.5 mg on Sat and Sun. Patient's INR was supratherapeutic on admission. Of note, patient is on amiodarone which was also a prior to admission medication that has been continued.   INR today is subtherapeutic at 1.66  Dosing  history: Date INR Dose 1/18 3.25 Hold 1/19 3.33 Hold 1/20 3.76 Hold 1/21 3.14 Hold 1/22 2.21 2.5 mg  Goal of Therapy:  INR 2-3   Plan:  INR subtherapeutic and decreased significantly from yesterday due to dose being held for several days.  Will increase dose and resume patient's home dose of warfarin with 5 mg this evening.  INR ordered with AM labs.  Pharmacy will continue to monitor, thank you for the consult.  Cindi Carbon, PharmD Clinical Pharmacist 07/21/2015,10:36 AM

## 2015-07-23 DIAGNOSIS — I5032 Chronic diastolic (congestive) heart failure: Secondary | ICD-10-CM | POA: Diagnosis not present

## 2015-07-23 DIAGNOSIS — I739 Peripheral vascular disease, unspecified: Secondary | ICD-10-CM | POA: Diagnosis not present

## 2015-07-23 DIAGNOSIS — I4892 Unspecified atrial flutter: Secondary | ICD-10-CM | POA: Diagnosis not present

## 2015-07-23 DIAGNOSIS — I480 Paroxysmal atrial fibrillation: Secondary | ICD-10-CM | POA: Diagnosis not present

## 2015-07-24 ENCOUNTER — Other Ambulatory Visit: Payer: Self-pay | Admitting: *Deleted

## 2015-07-24 NOTE — Patient Outreach (Signed)
Unsuccessful outreach for [facility] outreach for transition of care. HIPAA compliant message left.   Plan: RNCM will await return call  RNCM will attempt outreach again in 2 days.  Salli Bodin RN, BSN  [facility] Care Management ([phone]) 

## 2015-07-25 DIAGNOSIS — I2510 Atherosclerotic heart disease of native coronary artery without angina pectoris: Secondary | ICD-10-CM | POA: Diagnosis not present

## 2015-07-25 DIAGNOSIS — I4892 Unspecified atrial flutter: Secondary | ICD-10-CM | POA: Diagnosis not present

## 2015-07-25 DIAGNOSIS — I130 Hypertensive heart and chronic kidney disease with heart failure and stage 1 through stage 4 chronic kidney disease, or unspecified chronic kidney disease: Secondary | ICD-10-CM | POA: Diagnosis not present

## 2015-07-25 DIAGNOSIS — M25562 Pain in left knee: Secondary | ICD-10-CM | POA: Diagnosis not present

## 2015-07-25 DIAGNOSIS — E1122 Type 2 diabetes mellitus with diabetic chronic kidney disease: Secondary | ICD-10-CM | POA: Diagnosis not present

## 2015-07-25 DIAGNOSIS — I5032 Chronic diastolic (congestive) heart failure: Secondary | ICD-10-CM | POA: Diagnosis not present

## 2015-07-25 DIAGNOSIS — N189 Chronic kidney disease, unspecified: Secondary | ICD-10-CM | POA: Diagnosis not present

## 2015-07-25 DIAGNOSIS — I480 Paroxysmal atrial fibrillation: Secondary | ICD-10-CM | POA: Diagnosis not present

## 2015-07-25 DIAGNOSIS — D631 Anemia in chronic kidney disease: Secondary | ICD-10-CM | POA: Diagnosis not present

## 2015-07-28 ENCOUNTER — Ambulatory Visit
Admission: RE | Admit: 2015-07-28 | Discharge: 2015-07-28 | Disposition: A | Discharge status: Home / Self Care | Payer: Commercial Managed Care - HMO | Source: Ambulatory Visit | Attending: Internal Medicine | Admitting: Internal Medicine

## 2015-07-28 ENCOUNTER — Encounter: Payer: Self-pay | Admitting: *Deleted

## 2015-07-28 ENCOUNTER — Ambulatory Visit: Payer: Commercial Managed Care - HMO | Admitting: Anesthesiology

## 2015-07-28 ENCOUNTER — Encounter
Admission: RE | Disposition: A | Discharge status: Home / Self Care | Payer: Self-pay | Source: Ambulatory Visit | Attending: Internal Medicine

## 2015-07-28 DIAGNOSIS — I129 Hypertensive chronic kidney disease with stage 1 through stage 4 chronic kidney disease, or unspecified chronic kidney disease: Secondary | ICD-10-CM | POA: Diagnosis not present

## 2015-07-28 DIAGNOSIS — D631 Anemia in chronic kidney disease: Secondary | ICD-10-CM | POA: Insufficient documentation

## 2015-07-28 DIAGNOSIS — E1122 Type 2 diabetes mellitus with diabetic chronic kidney disease: Secondary | ICD-10-CM | POA: Diagnosis not present

## 2015-07-28 DIAGNOSIS — I739 Peripheral vascular disease, unspecified: Secondary | ICD-10-CM | POA: Insufficient documentation

## 2015-07-28 DIAGNOSIS — I4892 Unspecified atrial flutter: Secondary | ICD-10-CM | POA: Diagnosis not present

## 2015-07-28 DIAGNOSIS — Z7901 Long term (current) use of anticoagulants: Secondary | ICD-10-CM | POA: Diagnosis not present

## 2015-07-28 DIAGNOSIS — I10 Essential (primary) hypertension: Secondary | ICD-10-CM | POA: Diagnosis not present

## 2015-07-28 DIAGNOSIS — E119 Type 2 diabetes mellitus without complications: Secondary | ICD-10-CM | POA: Diagnosis not present

## 2015-07-28 DIAGNOSIS — I480 Paroxysmal atrial fibrillation: Secondary | ICD-10-CM | POA: Insufficient documentation

## 2015-07-28 DIAGNOSIS — M25562 Pain in left knee: Secondary | ICD-10-CM | POA: Insufficient documentation

## 2015-07-28 DIAGNOSIS — Z79899 Other long term (current) drug therapy: Secondary | ICD-10-CM | POA: Diagnosis not present

## 2015-07-28 DIAGNOSIS — I5032 Chronic diastolic (congestive) heart failure: Secondary | ICD-10-CM | POA: Insufficient documentation

## 2015-07-28 DIAGNOSIS — Z87891 Personal history of nicotine dependence: Secondary | ICD-10-CM | POA: Insufficient documentation

## 2015-07-28 DIAGNOSIS — N189 Chronic kidney disease, unspecified: Secondary | ICD-10-CM | POA: Insufficient documentation

## 2015-07-28 DIAGNOSIS — I509 Heart failure, unspecified: Secondary | ICD-10-CM | POA: Diagnosis not present

## 2015-07-28 DIAGNOSIS — E785 Hyperlipidemia, unspecified: Secondary | ICD-10-CM | POA: Insufficient documentation

## 2015-07-28 DIAGNOSIS — I4891 Unspecified atrial fibrillation: Secondary | ICD-10-CM | POA: Diagnosis not present

## 2015-07-28 HISTORY — PX: ELECTROPHYSIOLOGIC STUDY: SHX172A

## 2015-07-28 LAB — PROTIME-INR
INR: 2.35
PROTHROMBIN TIME: 25.5 s — AB (ref 11.4–15.0)

## 2015-07-28 SURGERY — CARDIOVERSION (CATH LAB)
Anesthesia: General

## 2015-07-28 MED ORDER — PROPOFOL 10 MG/ML IV BOLUS
INTRAVENOUS | Status: DC | PRN
  Administered 2015-07-28: 20 mg via INTRAVENOUS
  Administered 2015-07-28: 70 mg via INTRAVENOUS

## 2015-07-28 MED ORDER — SODIUM CHLORIDE 0.9 % IV SOLN
INTRAVENOUS | Status: DC
  Administered 2015-07-28 (×2): via INTRAVENOUS

## 2015-07-28 NOTE — Anesthesia Postprocedure Evaluation (Signed)
Anesthesia Post Note  Patient: [name]  Procedure(s) Performed: Procedure(s) (LRB): Cardioversion (N/A)  Patient location during evaluation: Other Anesthesia Type: General Level of consciousness: awake Pain management: pain level controlled Vital Signs Assessment: post-procedure vital signs reviewed and stable Respiratory status: spontaneous breathing Cardiovascular status: blood pressure returned to baseline Anesthetic complications: no    Last Vitals:  Filed Vitals:   07/28/15 0900 07/28/15 0915  BP: 118/79   Pulse: 60 61  Resp: 15 16    Last Pain: There were no vitals filed for this visit.               Donise Woodle S

## 2015-07-28 NOTE — Transfer of Care (Signed)
Immediate Anesthesia Transfer of Care Note  Patient: [name]  Procedure(s) Performed: Procedure(s): Cardioversion (N/A)  Patient Location: specials  Anesthesia Type:General  Level of Consciousness: awake  Airway & Oxygen Therapy: Patient Spontanous Breathing and Patient connected to nasal cannula oxygen  Post-op Assessment: Report given to RN  Post vital signs: Reviewed  Last Vitals:  Filed Vitals:   07/28/15 0647 07/28/15 0821  BP: 126/89   Pulse: 143 76  Resp: 16 20    Complications: No apparent anesthesia complications

## 2015-07-28 NOTE — CV Procedure (Signed)
Electrical Cardioversion Procedure Note [name] [id number] 1946-05-07  Procedure: Electrical Cardioversion Indications:  Atrial Flutter  Procedure Details Consent: Risks of procedure as well as the alternatives and risks of each were explained to the (patient/caregiver).  Consent for procedure obtained. Time Out: Verified patient identification, verified procedure, site/side was marked, verified correct patient position, special equipment/implants available, medications/allergies/relevent history reviewed, required imaging and test results available.  Performed  Patient placed on cardiac monitor, pulse oximetry, supplemental oxygen as necessary.  Sedation given: Benzodiazepines, Muscle relaxants, Etomidate and Short-acting barbiturates Pacer pads placed anterior and posterior chest.  Cardioverted 1 time(s).  Cardioverted at 120J.  Evaluation Findings: Post procedure EKG shows: NSR Complications: None Patient did tolerate procedure well.   [name] J 07/28/2015, 8:30 AM

## 2015-07-28 NOTE — Anesthesia Preprocedure Evaluation (Signed)
Anesthesia Evaluation  Patient identified by MRN, date of birth, ID band Patient awake    Reviewed: Allergy & Precautions, NPO status , Patient's Chart, lab work & pertinent test results, reviewed documented beta blocker date and time   Airway Mallampati: II  TM Distance: >3 FB     Dental  (+) Chipped   Pulmonary shortness of breath, former smoker,           Cardiovascular hypertension, Pt. on medications and Pt. on home beta blockers + Past MI and +CHF       Neuro/Psych    GI/Hepatic PUD,   Endo/Other  diabetes  Renal/GU Renal InsufficiencyRenal disease     Musculoskeletal   Abdominal   Peds  Hematology  (+) anemia ,   Anesthesia Other Findings   Reproductive/Obstetrics                             Anesthesia Physical Anesthesia Plan  ASA: III  Anesthesia Plan: General   Post-op Pain Management:    Induction: Intravenous  Airway Management Planned: Nasal Cannula  Additional Equipment:   Intra-op Plan:   Post-operative Plan:   Informed Consent: I have reviewed the patients History and Physical, chart, labs and discussed the procedure including the risks, benefits and alternatives for the proposed anesthesia with the patient or authorized representative who has indicated his/her understanding and acceptance.     Plan Discussed with: CRNA  Anesthesia Plan Comments:         Anesthesia Quick Evaluation

## 2015-07-28 NOTE — Anesthesia Procedure Notes (Signed)
Date/Time: 07/28/2015 7:19 AM Performed by: Junious Silk Pre-anesthesia Checklist: Patient identified, Emergency Drugs available, Suction available, Patient being monitored and Timeout performed Oxygen Delivery Method: Nasal cannula

## 2015-07-29 ENCOUNTER — Other Ambulatory Visit: Payer: Self-pay | Admitting: *Deleted

## 2015-07-29 NOTE — Patient Outreach (Signed)
RNCM called pt as part of transition of care program spouse answered. Permission granted to speak to spouse.  [facility] services explained. Transition of care flow-sheet completed. RNCM made pt aware she would call back next week. Spouse stated pt had a procedure to "shock his heart" yesterday and was doing really well. Denies edema, sob, or inability to lie flat. Spouse states pt is doing everything like normal. Spouse did state she would get a scale for pt to do daily weights. Spouse denied any problems affording medications.   Plan: RNCM will call pt next week as continued part of the transition of care program.  Costella Hatcher RN, BSN  [facility] Care Management [phone]

## 2015-07-30 ENCOUNTER — Ambulatory Visit: Payer: Commercial Managed Care - HMO | Admitting: Family Medicine

## 2015-07-31 DIAGNOSIS — E1122 Type 2 diabetes mellitus with diabetic chronic kidney disease: Secondary | ICD-10-CM | POA: Diagnosis not present

## 2015-07-31 DIAGNOSIS — I5032 Chronic diastolic (congestive) heart failure: Secondary | ICD-10-CM | POA: Diagnosis not present

## 2015-07-31 DIAGNOSIS — N189 Chronic kidney disease, unspecified: Secondary | ICD-10-CM | POA: Diagnosis not present

## 2015-07-31 DIAGNOSIS — I4892 Unspecified atrial flutter: Secondary | ICD-10-CM | POA: Diagnosis not present

## 2015-07-31 DIAGNOSIS — I130 Hypertensive heart and chronic kidney disease with heart failure and stage 1 through stage 4 chronic kidney disease, or unspecified chronic kidney disease: Secondary | ICD-10-CM | POA: Diagnosis not present

## 2015-07-31 DIAGNOSIS — M25562 Pain in left knee: Secondary | ICD-10-CM | POA: Diagnosis not present

## 2015-07-31 DIAGNOSIS — I480 Paroxysmal atrial fibrillation: Secondary | ICD-10-CM | POA: Diagnosis not present

## 2015-07-31 DIAGNOSIS — D631 Anemia in chronic kidney disease: Secondary | ICD-10-CM | POA: Diagnosis not present

## 2015-07-31 DIAGNOSIS — I2510 Atherosclerotic heart disease of native coronary artery without angina pectoris: Secondary | ICD-10-CM | POA: Diagnosis not present

## 2015-08-04 ENCOUNTER — Telehealth: Payer: Self-pay | Admitting: Family Medicine

## 2015-08-04 NOTE — Telephone Encounter (Signed)
Pt needs a referral placed to go see Dr Jeneen Montgomery Orth[facility] he is located [facility] in [place]. He can get in on Thursday 08/07/2015 for 10:00 AM

## 2015-08-05 ENCOUNTER — Telehealth: Payer: Self-pay | Admitting: Family Medicine

## 2015-08-05 NOTE — Telephone Encounter (Signed)
Spoke with  [facility]- Ortho, patient is schedule to see Leeroy Bock. They need a referral for him. Auth obtained, tried to call pt but unable to receive calls at this time.  Auth# [id number], 6 visits  Start- 08/05/15 Expires- 02/01/16

## 2015-08-05 NOTE — Telephone Encounter (Signed)
Needs

## 2015-08-05 NOTE — Telephone Encounter (Signed)
I have placed the referral in he Human portal but, due to Dr. Earney Mallet not being a [facility] provider the status is pending. Will notify the pt once the status changes to approved

## 2015-08-08 ENCOUNTER — Emergency Department
Admission: EM | Admit: 2015-08-08 | Discharge: 2015-08-08 | Disposition: A | Discharge status: Home / Self Care | Payer: Commercial Managed Care - HMO | Attending: Emergency Medicine | Admitting: Emergency Medicine

## 2015-08-08 ENCOUNTER — Emergency Department: Payer: Commercial Managed Care - HMO

## 2015-08-08 ENCOUNTER — Encounter: Payer: Self-pay | Admitting: Family Medicine

## 2015-08-08 ENCOUNTER — Ambulatory Visit (INDEPENDENT_AMBULATORY_CARE_PROVIDER_SITE_OTHER): Payer: Commercial Managed Care - HMO | Admitting: Family Medicine

## 2015-08-08 ENCOUNTER — Inpatient Hospital Stay: Admission: AD | Admit: 2015-08-08 | Payer: Medicare HMO | Source: Ambulatory Visit | Admitting: Family Medicine

## 2015-08-08 ENCOUNTER — Other Ambulatory Visit: Payer: Self-pay | Admitting: *Deleted

## 2015-08-08 VITALS — BP 138/82 | HR 152 | Temp 97.7°F | Resp 18 | Ht 71.0 in | Wt 201.1 lb

## 2015-08-08 DIAGNOSIS — I4892 Unspecified atrial flutter: Secondary | ICD-10-CM | POA: Diagnosis not present

## 2015-08-08 DIAGNOSIS — I499 Cardiac arrhythmia, unspecified: Secondary | ICD-10-CM | POA: Diagnosis not present

## 2015-08-08 DIAGNOSIS — R008 Other abnormalities of heart beat: Secondary | ICD-10-CM | POA: Diagnosis present

## 2015-08-08 DIAGNOSIS — E118 Type 2 diabetes mellitus with unspecified complications: Secondary | ICD-10-CM | POA: Diagnosis not present

## 2015-08-08 DIAGNOSIS — Z79899 Other long term (current) drug therapy: Secondary | ICD-10-CM | POA: Insufficient documentation

## 2015-08-08 DIAGNOSIS — Z7984 Long term (current) use of oral hypoglycemic drugs: Secondary | ICD-10-CM | POA: Diagnosis not present

## 2015-08-08 DIAGNOSIS — Z7901 Long term (current) use of anticoagulants: Secondary | ICD-10-CM | POA: Diagnosis not present

## 2015-08-08 DIAGNOSIS — M25462 Effusion, left knee: Secondary | ICD-10-CM | POA: Diagnosis not present

## 2015-08-08 DIAGNOSIS — N183 Chronic kidney disease, stage 3 (moderate): Secondary | ICD-10-CM | POA: Insufficient documentation

## 2015-08-08 DIAGNOSIS — I129 Hypertensive chronic kidney disease with stage 1 through stage 4 chronic kidney disease, or unspecified chronic kidney disease: Secondary | ICD-10-CM | POA: Diagnosis not present

## 2015-08-08 DIAGNOSIS — Z87891 Personal history of nicotine dependence: Secondary | ICD-10-CM | POA: Diagnosis not present

## 2015-08-08 DIAGNOSIS — M25562 Pain in left knee: Secondary | ICD-10-CM

## 2015-08-08 LAB — CBC
HCT: 36.4 % — ABNORMAL LOW (ref 40.0–52.0)
Hemoglobin: 11.4 g/dL — ABNORMAL LOW (ref 13.0–18.0)
MCH: 22.4 pg — AB (ref 26.0–34.0)
MCHC: 31.4 g/dL — ABNORMAL LOW (ref 32.0–36.0)
MCV: 71.3 fL — ABNORMAL LOW (ref 80.0–100.0)
PLATELETS: 268 10*3/uL (ref 150–440)
RBC: 5.1 MIL/uL (ref 4.40–5.90)
RDW: 15.9 % — ABNORMAL HIGH (ref 11.5–14.5)
WBC: 3.6 10*3/uL — AB (ref 3.8–10.6)

## 2015-08-08 LAB — BASIC METABOLIC PANEL
Anion gap: 8 (ref 5–15)
BUN: 45 mg/dL — ABNORMAL HIGH (ref 6–20)
CO2: 22 mmol/L (ref 22–32)
CREATININE: 2 mg/dL — AB (ref 0.61–1.24)
Calcium: 9 mg/dL (ref 8.9–10.3)
Chloride: 111 mmol/L (ref 101–111)
GFR, EST AFRICAN AMERICAN: 37 mL/min — AB (ref >60)
GFR, EST NON AFRICAN AMERICAN: 32 mL/min — AB (ref >60)
Glucose, Bld: 165 mg/dL — ABNORMAL HIGH (ref 65–99)
Potassium: 4.4 mmol/L (ref 3.5–5.1)
SODIUM: 141 mmol/L (ref 135–145)

## 2015-08-08 LAB — PROTIME-INR
INR: 2.35
PROTHROMBIN TIME: 25.5 s — AB (ref 11.4–15.0)

## 2015-08-08 LAB — TROPONIN I: Troponin I: 0.03 ng/mL (ref <0.031)

## 2015-08-08 MED ORDER — OXYCODONE-ACETAMINOPHEN 5-325 MG PO TABS: 1.0000 | ORAL_TABLET | ORAL | Status: DC | PRN

## 2015-08-08 MED ORDER — DILTIAZEM HCL 25 MG/5ML IV SOLN
10.0000 mg | Freq: Once | INTRAVENOUS | Status: AC
  Administered 2015-08-08: 10 mg via INTRAVENOUS
  Filled 2015-08-08: qty 5

## 2015-08-08 MED ORDER — SODIUM CHLORIDE 0.9 % IV BOLUS (SEPSIS)
1000.0000 mL | Freq: Once | INTRAVENOUS | Status: AC
  Administered 2015-08-08: 1000 mL via INTRAVENOUS

## 2015-08-08 MED ORDER — HYDROCODONE-ACETAMINOPHEN 5-325 MG PO TABS
1.0000 | ORAL_TABLET | Freq: Once | ORAL | Status: AC
  Administered 2015-08-08: 1 via ORAL
  Filled 2015-08-08: qty 1

## 2015-08-08 NOTE — Discharge Instructions (Signed)
You were found to have irregular heartbeat, called atrial flutter, and rate is controlled. Follow up with your primary care physician and cardiologist for further monitoring of your heart rhythm. For your left knee pain, follow-up with orthopedic physician.  Return to the emergency room for any worsening condition including chest pain, palpitations, dizziness, passing out, weakness or numbness, confusion altered mental status, fever, new or worsening left knee pain, left knee swelling, or skin rash.  Atrial Flutter Atrial flutter is a heart rhythm that can cause the heart to beat very fast (tachycardia). It originates in the upper chambers of the heart (atria). In atrial flutter, the top chambers of the heart (atria) often beat much faster than the bottom chambers of the heart (ventricles). Atrial flutter has a regular "saw toothed" appearance in an EKG readout. An EKG is a test that records the electrical activity of the heart. Atrial flutter can cause the heart to beat up to 150 beats per minute (BPM). Atrial flutter can either be short lived (paroxysmal) or permanent.  CAUSES  Causes of atrial flutter can be many. Some of these include:  Heart related issues:  Heart attack (myocardial infarction).  Heart failure.  Heart valve problems.  Poorly controlled high blood pressure (hypertension).  After open heart surgery.  Lung related issues:  A blood clot in the lungs (pulmonary embolism).  Chronic obstructive pulmonary disease (COPD). Medications used to treat COPD can attribute to atrial flutter.  Other related causes:  Hyperthyroidism.  Caffeine.  Some decongestant cold medications.  Low electrolyte levels such as potassium or magnesium.  Cocaine. SYMPTOMS  An awareness of your heart beating rapidly (palpitations).  Shortness of breath.  Chest pain.  Low blood pressure (hypotension).  Dizziness or fainting. DIAGNOSIS  Different tests can be performed to diagnose  atrial flutter.   An EKG.  Holter monitor. This is a 24-hour recording of your heart rhythm. You will also be given a diary. Write down all symptoms that you have and what you were doing at the time you experienced symptoms.  Cardiac event monitor. This small device can be worn for up to 30 days. When you have heart symptoms, you will push a button on the device. This will then record your heart rhythm.  Echocardiogram. This is an imaging test to look at your heart. Your caregiver will look at your heart valves and the ventricles.  Stress test. This test can help determine if the atrial flutter is related to exercise or if coronary artery disease is present.  Laboratory studies will look at certain blood levels like:  Complete blood count (CBC).  Potassium.  Magnesium.  Thyroid function. TREATMENT  Treatment of atrial flutter varies. A combination of therapies may be used or sometimes atrial flutter may need only 1 type of treatment.  Lab work: If your blood work, such as your electrolytes (potassium, magnesium) or your thyroid function tests, are abnormal, your caregiver will treat them accordingly.  Medication:  There are several different types of medications that can convert your heart to a normal rhythm and prevent atrial flutter from reoccurring.  Nonsurgical procedures: Nonsurgical techniques may be used to control atrial flutter. Some examples include:  Cardioversion. This technique uses either drugs or an electrical shock to restore a normal heart rhythm:  Cardioversion drugs may be given through an intravenous (IV) line to help "reset" the heart rhythm.  In electrical cardioversion, your caregiver shocks your heart with electrical energy. This helps to reset the heartbeat to a normal  rhythm.  Ablation. If atrial flutter is a persistent problem, an ablation may be needed. This procedure is done under mild sedation. High frequency radio-wave energy is used to destroy the  area of heart tissue responsible for atrial flutter. SEEK IMMEDIATE MEDICAL CARE IF:  You have:  Dizziness.  Near fainting or fainting.  Shortness of breath.  Chest pain or pressure.  Sudden nausea or vomiting.  Profuse sweating. If you have the above symptoms, call your local emergency service immediately! Do not drive yourself to the hospital. MAKE SURE YOU:   Understand these instructions.  Will watch your condition.  Will get help right away if you are not doing well or get worse.   This information is not intended to replace advice given to you by your health care provider. Make sure you discuss any questions you have with your health care provider.   Document Released: 10/31/2008 Document Revised: 07/05/2014 Document Reviewed: 12/27/2014 Elsevier Interactive Patient Education [employer]

## 2015-08-08 NOTE — ED Provider Notes (Signed)
[facility] Emergency Department Provider Note   ____________________________________________  Time seen: Approximately 1:45 PM I have reviewed the triage vital signs and the triage nursing note.  HISTORY  Chief Complaint Atrial Flutter   Historian Patient, wife  HPI [name] is a 70 y.o. male was sent here from the primary care physician's office for "irregular heart beat. "Patient was recently in the hospital for A. fib with RVR and left knee pain. He was following up today for continued/persistent left knee pain to obtain a referral to see the orthopedic in follow-up. Patient states he was told it wasn't gout or infection in the hospital. He is not complaining of palpitations, dizziness, chest pain, trouble breathing today. His only complaint is left knee pain with persistent swelling. No fevers. Symptoms of pain is moderate. He was taking pain pills, but he is run out of these.    Past Medical History  Diagnosis Date  . Ventricular tachycardia (HCC) 05/20/2014  . Atrial fibrillation and flutter (HCC)   . GI bleeding   . Gastric ulcer   . Diabetes mellitus with complication (HCC)   . Essential hypertension   . Myocardial infarction (HCC)   . CHF (congestive heart failure) [facility])     Patient Active Problem List   Diagnosis Date Noted  . Left knee pain 08/08/2015  . Pain and swelling of left knee 07/16/2015  . Atrial fibrillation (HCC) 07/16/2015  . Dyspnea 04/17/2015  . Atypical atrial flutter (HCC) 04/17/2015  . Acute renal insufficiency 04/17/2015  . Sinus bradycardia 04/17/2015  . Atrial flutter (HCC) 04/16/2015  . A-fib (HCC) 02/05/2015  . Gastrointestinal bleeding, upper 02/05/2015  . Essential hypertension 05/21/2014  . CKD (chronic kidney disease) stage 3, GFR 30-59 ml/min 05/21/2014  . Dyslipidemia 05/21/2014  . 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 Anemia 05/21/2014  . Ventricular  tachycardia (HCC) 05/20/2014  . NSTEMI (non-ST elevated myocardial infarction) (HCC) 05/20/2014    Past Surgical History  Procedure Laterality Date  . Left heart catheterization with coronary angiogram N/A 05/20/2014    Procedure: LEFT HEART CATHETERIZATION WITH CORONARY ANGIOGRAM;  Surgeon: Micheline Chapman, MD;  Location: [facility] CATH LAB;  Service: Cardiovascular;  Laterality: N/A;  . Hernia repair    . Electrophysiologic study N/A 07/28/2015    Procedure: Cardioversion;  Surgeon: Lamar Blinks, MD;  Location: [facility] ORS;  Service: Cardiovascular;  Laterality: N/A;    Current Outpatient Rx  Name  Route  Sig  Dispense  Refill  . amiodarone (PACERONE) 400 MG tablet   Oral   Take 1 tablet (400 mg total) by mouth daily.   30 tablet   6   . diltiazem (CARDIZEM CD) 240 MG 24 hr capsule   Oral   Take 1 capsule (240 mg total) by mouth daily.   30 capsule   0   . glipiZIDE (GLUCOTROL) 10 MG tablet   Oral   Take 1 tablet (10 mg total) by mouth 2 (two) times daily before a meal.   180 tablet   1   . metoprolol tartrate (LOPRESSOR) 25 MG tablet   Oral   Take 1 tablet (25 mg total) by mouth 2 (two) times daily.   180 tablet   1   . oxyCODONE-acetaminophen (ROXICET) 5-325 MG tablet   Oral   Take 1 tablet by mouth every 4 (four) hours as needed for severe pain.   10 tablet   0   . simvastatin (ZOCOR)  10 MG tablet   Oral   Take 10 mg by mouth at bedtime.         . valsartan-hydrochlorothiazide (DIOVAN-HCT) 320-25 MG tablet   Oral   Take 1 tablet by mouth daily.         Marland Kitchen warfarin (COUMADIN) 5 MG tablet   Oral   Take 2.5-5 mg by mouth daily. Pt takes one tablet Monday-Friday and one-half tablet Saturday and Sunday.           Allergies Review of patient's allergies indicates no known allergies.  Family History  Problem Relation Age of Onset  . Congestive Heart Failure Mother     Social History Social History  Substance Use Topics  . Smoking status: Former  Smoker    Quit date: 04/28/1964  . Smokeless tobacco: Never Used  . Alcohol Use: No    Review of Systems  Constitutional: Negative for fever. Eyes: Negative for visual changes. ENT: Negative for sore throat. Cardiovascular: Negative for chest pain. Respiratory: Negative for shortness of breath. Gastrointestinal: Negative for abdominal pain, vomiting and diarrhea. Genitourinary: Negative for dysuria. Musculoskeletal: Positive for left knee pain and swelling. Skin: Negative for rash. Neurological: Negative for headache. 10 point Review of Systems otherwise negative ____________________________________________   PHYSICAL EXAM:  VITAL SIGNS: ED Triage Vitals  Enc Vitals Group     BP 08/08/15 1240 174/80 mmHg     Pulse Rate 08/08/15 1240 123     Resp 08/08/15 1240 16     Temp 08/08/15 1240 98 F (36.7 C)     Temp Source 08/08/15 1240 Oral     SpO2 08/08/15 1240 99 %     Weight 08/08/15 1240 201 lb (91.173 kg)     Height 08/08/15 1240 5\' 11"  (1.803 m)     Head Cir --      Peak Flow --      Pain Score 08/08/15 1241 0     Pain Loc --      Pain Edu? --      Excl. in GC? --      Constitutional: Alert and oriented. Well appearing and in no distress. HEENT   Head: Normocephalic and atraumatic.      Eyes: Conjunctivae are normal. PERRL. Normal extraocular movements.      Ears:         Nose: No congestion/rhinnorhea.   Mouth/Throat: Mucous membranes are moist.   Neck: No stridor. Cardiovascular/Chest:Irregularly irregular. No murmurs, rubs, or gallops. Respiratory: Normal respiratory effort without tachypnea nor retractions. Breath sounds are clear and equal bilaterally. No wheezes/rales/rhonchi. Gastrointestinal: Soft. No distention, no guarding, no rebound. Nontender.   Genitourinary/rectal:Deferred Musculoskeletal: Mild left knee effusion with some tenderness to palpation at suprapatellar area.  No redness, skin rash, or warmth. Neurologic:  Normal speech and  language. No gross or focal neurologic deficits are appreciated. Skin:  Skin is warm, dry and intact. No rash noted. Psychiatric: Mood and affect are normal. Speech and behavior are normal. Patient exhibits appropriate insight and judgment.  ____________________________________________   EKG I, Governor Rooks, MD, the attending physician have personally viewed and interpreted all ECGs.  Atrial flutter with variable block. Rate near 50. Occasional PVC. Incomplete right bundle branch block. Nonspecific T wave. ____________________________________________  LABS (pertinent positives/negatives)  BMP significant for BUN 45 and creatinine 2.0 White blood count 11.6, hemoglobin 11.4 and platelet count 268 INR 2.35 Troponin 0.03  ____________________________________________  RADIOLOGY All Xrays were viewed by me. Imaging interpreted by Radiologist.  Chest: No  active cardiopulmonary disease __________________________________________  PROCEDURES  Procedure(s) performed: None  Critical Care performed: None  ____________________________________________   ED COURSE / ASSESSMENT AND PLAN  Pertinent labs & imaging results that were available during my care of the patient were reviewed by me and considered in my medical decision making (see chart for details).   Patient was sent for a flutter with rapid ventricular response. IV diltiazem was ordered for elevated heart rate.  When I went to evaluate the patient, he was receiving the diltiazem bolus, and his heart rate was actually in the 50s at this point in time.  It is unclear to me whether the heart rate reported was erroneous off the monitor, or if he had slowed on his own prior to receiving the full dose of Cardizem. In either case, his EKG shows atrial flutter with variable block. He is denying symptoms of palpitations, chest pain, shortness of breath, dizziness or passing out. His only complaint is the left knee pain which is there  when he was in the hospital a few weeks ago. I reviewed his synovial fluid analysis which showed near 5000 white blood cells and no crystals. The patient is requesting additional pain medication and I will refill the Roxicet that he was taking with some relief but has run out. His memory care appointment today was in order to obtain a referral to see orthopedic doctor in follow-up.  From the standpoint of his knee, I don't see any evidence that there is new joint infection and I don't think he has enough fluid in there at that there would be significant improvement with therapeutic tap.  From the standpoint of his heart rhythm, he is rate controlled, and his INR is therapeutic and I will go ahead and discharge him home.  He was given 1 L normal saline for mild acute on chronic renal failure.   CONSULTATIONS:   None   Patient / Family / Caregiver informed of clinical course, medical decision-making process, and agree with plan.   I discussed return precautions, follow-up instructions, and discharged instructions with patient and/or family.   ___________________________________________   FINAL CLINICAL IMPRESSION(S) / ED DIAGNOSES   Final diagnoses:  Atrial flutter, unspecified type (HCC)  Knee effusion, left              Note: This dictation was prepared with Dragon dictation. Any transcriptional errors that result from this process are unintentional   Governor Rooks, MD 08/08/15 1549

## 2015-08-08 NOTE — Progress Notes (Signed)
Name: [name]   MRN: [id number]    DOB: 28-Jan-1946   Date:08/08/2015       Progress Note  Subjective  Chief Complaint  Chief Complaint  Patient presents with  . paperwork    HPI  Pt. Returns for hospital follow up, admitted for irregular and fast heart bate, diagnosed with atrial flutter and is now being followed by Cardiology Dr. Gwen Pounds. He had a cardioversion in the interim and is now on rate and rhythm control medications Diltiazem and Amiodarone.  Past Medical History  Diagnosis Date  . Ventricular tachycardia (HCC) 05/20/2014  . Atrial fibrillation and flutter (HCC)   . GI bleeding   . Gastric ulcer   . Diabetes mellitus with complication (HCC)   . Essential hypertension   . Myocardial infarction (HCC)   . CHF (congestive heart failure) [facility])     Past Surgical History  Procedure Laterality Date  . Left heart catheterization with coronary angiogram N/A 05/20/2014    Procedure: LEFT HEART CATHETERIZATION WITH CORONARY ANGIOGRAM;  Surgeon: Micheline Chapman, MD;  Location: [facility] CATH LAB;  Service: Cardiovascular;  Laterality: N/A;  . Hernia repair    . Electrophysiologic study N/A 07/28/2015    Procedure: Cardioversion;  Surgeon: Lamar Blinks, MD;  Location: [facility] ORS;  Service: Cardiovascular;  Laterality: N/A;    Family History  Problem Relation Age of Onset  . Congestive Heart Failure Mother     Social History   Social History  . Marital Status: Married    Spouse Name: N/A  . Number of Children: N/A  . Years of Education: N/A   Occupational History  . Not on file.   Social History Main Topics  . Smoking status: Former Smoker    Quit date: 04/28/1964  . Smokeless tobacco: Never Used  . Alcohol Use: No  . Drug Use: No  . Sexual Activity: Yes   Other Topics Concern  . Not on file   Social History Narrative   Married, lives in [place]. Retired from [employer] work. Nonsmoker, no Etoh.     Current outpatient prescriptions:  .  amiodarone  (PACERONE) 400 MG tablet, Take 1 tablet (400 mg total) by mouth daily., Disp: 30 tablet, Rfl: 6 .  diltiazem (CARDIZEM CD) 240 MG 24 hr capsule, Take 1 capsule (240 mg total) by mouth daily., Disp: 30 capsule, Rfl: 0 .  glipiZIDE (GLUCOTROL) 10 MG tablet, Take 1 tablet (10 mg total) by mouth 2 (two) times daily before a meal., Disp: 180 tablet, Rfl: 1 .  metoprolol tartrate (LOPRESSOR) 25 MG tablet, Take 1 tablet (25 mg total) by mouth 2 (two) times daily., Disp: 180 tablet, Rfl: 1 .  oxyCODONE-acetaminophen (ROXICET) 5-325 MG tablet, Take 1 tablet by mouth every 6 (six) hours as needed for moderate pain or severe pain., Disp: 30 tablet, Rfl: 0 .  simvastatin (ZOCOR) 10 MG tablet, Take 10 mg by mouth at bedtime., Disp: , Rfl:  .  valsartan-hydrochlorothiazide (DIOVAN-HCT) 320-25 MG tablet, Take 1 tablet by mouth daily., Disp: , Rfl:  .  warfarin (COUMADIN) 5 MG tablet, Take 2.5-5 mg by mouth daily. Pt takes one tablet Monday-Friday and one-half tablet Saturday and Sunday., Disp: , Rfl:   No Known Allergies   Review of Systems  Constitutional: Negative for fever and chills.  Respiratory: Negative for shortness of breath.   Cardiovascular: Negative for chest pain and palpitations.  Neurological: Negative for dizziness.     Objective  Filed Vitals:   08/08/15 1104  BP: 138/82  Pulse: 152  Temp: 97.7 F (36.5 C)  Resp: 18  Height:  (1.803 m)  Weight: 201 lb 1 oz (91.201 kg)  SpO2: 98%    Physical Exam  Constitutional: He is oriented to person, place, and time and well-developed, well-nourished, and in no distress.  Cardiovascular: Regular rhythm, S1 normal and S2 normal.  Tachycardia present.   No murmur heard. Neurological: He is alert and oriented to person, place, and time.  Nursing note and vitals reviewed.      Assessment & Plan  1. Atrial flutter, unspecified type (HCC) Heart rate in 150s, EKG obtained in office today shows atrial flutter. Patient recently was  cardioverted and is on rate and rhythm control medication. Denies any symptoms at present. We'll refer to ED for further evaluation  2. Left knee pain Patient requesting referral to orthopedic surgery for follow-up of left knee pain. Was seen and evaluated during last hospital admission. - Ambulatory referral to Orthopedic Surgery     Lebron Nauert Asad [facility] 08/08/2015 11:30 AM

## 2015-08-08 NOTE — ED Notes (Signed)
Pt reports to ED w/ c/o a flutter.  Per EMS, pt was a PCP's office for wellness check when hooked to monitor and saw unusual rhythm.  Pt denies CP, SOB or pain other than knee.  Pt does have hx of a flutter and was cardioverted last time.  Pt sts he takes cardizem and amiodarone PO.

## 2015-08-08 NOTE — Patient Outreach (Signed)
RNCM opened chart to call pt for transition of care and noted pt is in patient. RNCM will close current transition of care program to reopen when pt is discharged.   Plan: Will report to oncoming RNCM to watch for pt's discharge.  Costella Hatcher RN, BSN  [facility] Care Management [phone])

## 2015-08-11 ENCOUNTER — Ambulatory Visit: Payer: Medicare HMO | Admitting: Family Medicine

## 2015-08-11 ENCOUNTER — Other Ambulatory Visit: Payer: Self-pay | Admitting: *Deleted

## 2015-08-11 NOTE — Patient Outreach (Signed)
Transition of care (discharged 1/23).  Pt was transferred from coworker Janci Minor RN CM, being followed for transition of care.  Was informed by coworker pt had a recent ED visit 2/10.  Attempt made to contact pt, unable to leave a message as message on phone - not available.   Will try again .      Shayne Alken.   Pierzchala RN CCM [facility] Care Management  [phone]

## 2015-08-12 ENCOUNTER — Other Ambulatory Visit: Payer: Self-pay | Admitting: *Deleted

## 2015-08-12 NOTE — Patient Outreach (Signed)
Second attempt made to contact pt, called spouse's (on [facility] consent) number.  HIPPA compliant voice message left with contact number.   If no response, will try again.       Shayne Alken.   Caressa Scearce RN CCM [facility] Care Management  [phone]

## 2015-08-13 DIAGNOSIS — M25562 Pain in left knee: Secondary | ICD-10-CM | POA: Diagnosis not present

## 2015-08-13 DIAGNOSIS — M659 Synovitis and tenosynovitis, unspecified: Secondary | ICD-10-CM | POA: Diagnosis not present

## 2015-08-13 DIAGNOSIS — M2392 Unspecified internal derangement of left knee: Secondary | ICD-10-CM | POA: Diagnosis not present

## 2015-08-20 ENCOUNTER — Other Ambulatory Visit: Payer: Self-pay | Admitting: Family Medicine

## 2015-08-20 DIAGNOSIS — I10 Essential (primary) hypertension: Secondary | ICD-10-CM | POA: Diagnosis not present

## 2015-08-20 DIAGNOSIS — I5032 Chronic diastolic (congestive) heart failure: Secondary | ICD-10-CM | POA: Diagnosis not present

## 2015-08-20 DIAGNOSIS — I482 Chronic atrial fibrillation: Secondary | ICD-10-CM | POA: Diagnosis not present

## 2015-08-20 DIAGNOSIS — R0602 Shortness of breath: Secondary | ICD-10-CM | POA: Diagnosis not present

## 2015-08-20 DIAGNOSIS — I739 Peripheral vascular disease, unspecified: Secondary | ICD-10-CM | POA: Diagnosis not present

## 2015-08-20 DIAGNOSIS — I4892 Unspecified atrial flutter: Secondary | ICD-10-CM | POA: Diagnosis not present

## 2015-08-21 DIAGNOSIS — N189 Chronic kidney disease, unspecified: Secondary | ICD-10-CM | POA: Diagnosis not present

## 2015-08-21 DIAGNOSIS — M25562 Pain in left knee: Secondary | ICD-10-CM | POA: Diagnosis not present

## 2015-08-21 DIAGNOSIS — D631 Anemia in chronic kidney disease: Secondary | ICD-10-CM | POA: Diagnosis not present

## 2015-08-21 DIAGNOSIS — E1122 Type 2 diabetes mellitus with diabetic chronic kidney disease: Secondary | ICD-10-CM | POA: Diagnosis not present

## 2015-08-21 DIAGNOSIS — I2510 Atherosclerotic heart disease of native coronary artery without angina pectoris: Secondary | ICD-10-CM | POA: Diagnosis not present

## 2015-08-21 DIAGNOSIS — I4892 Unspecified atrial flutter: Secondary | ICD-10-CM | POA: Diagnosis not present

## 2015-08-21 DIAGNOSIS — I480 Paroxysmal atrial fibrillation: Secondary | ICD-10-CM | POA: Diagnosis not present

## 2015-08-21 DIAGNOSIS — I130 Hypertensive heart and chronic kidney disease with heart failure and stage 1 through stage 4 chronic kidney disease, or unspecified chronic kidney disease: Secondary | ICD-10-CM | POA: Diagnosis not present

## 2015-08-21 DIAGNOSIS — I5032 Chronic diastolic (congestive) heart failure: Secondary | ICD-10-CM | POA: Diagnosis not present

## 2015-08-21 DIAGNOSIS — M659 Synovitis and tenosynovitis, unspecified: Secondary | ICD-10-CM | POA: Diagnosis not present

## 2015-08-26 ENCOUNTER — Ambulatory Visit: Payer: Commercial Managed Care - HMO | Admitting: Family Medicine

## 2015-08-28 ENCOUNTER — Other Ambulatory Visit: Payer: Self-pay | Admitting: Family Medicine

## 2015-08-31 ENCOUNTER — Other Ambulatory Visit: Payer: Self-pay | Admitting: Family Medicine

## 2015-09-01 ENCOUNTER — Telehealth: Payer: Self-pay

## 2015-09-01 DIAGNOSIS — E118 Type 2 diabetes mellitus with unspecified complications: Principal | ICD-10-CM

## 2015-09-01 DIAGNOSIS — E1165 Type 2 diabetes mellitus with hyperglycemia: Secondary | ICD-10-CM

## 2015-09-01 MED ORDER — GLIPIZIDE 10 MG PO TABS: 10.0000 mg | ORAL_TABLET | Freq: Two times a day (BID) | ORAL | Status: DC

## 2015-09-01 NOTE — Telephone Encounter (Signed)
Medication has been refilled and sent to [facility] [address] 

## 2015-09-04 DIAGNOSIS — E1122 Type 2 diabetes mellitus with diabetic chronic kidney disease: Secondary | ICD-10-CM | POA: Diagnosis not present

## 2015-09-04 DIAGNOSIS — M25562 Pain in left knee: Secondary | ICD-10-CM | POA: Diagnosis not present

## 2015-09-04 DIAGNOSIS — I480 Paroxysmal atrial fibrillation: Secondary | ICD-10-CM | POA: Diagnosis not present

## 2015-09-04 DIAGNOSIS — N189 Chronic kidney disease, unspecified: Secondary | ICD-10-CM | POA: Diagnosis not present

## 2015-09-04 DIAGNOSIS — D631 Anemia in chronic kidney disease: Secondary | ICD-10-CM | POA: Diagnosis not present

## 2015-09-04 DIAGNOSIS — I130 Hypertensive heart and chronic kidney disease with heart failure and stage 1 through stage 4 chronic kidney disease, or unspecified chronic kidney disease: Secondary | ICD-10-CM | POA: Diagnosis not present

## 2015-09-04 DIAGNOSIS — I5032 Chronic diastolic (congestive) heart failure: Secondary | ICD-10-CM | POA: Diagnosis not present

## 2015-09-04 DIAGNOSIS — I2510 Atherosclerotic heart disease of native coronary artery without angina pectoris: Secondary | ICD-10-CM | POA: Diagnosis not present

## 2015-09-04 DIAGNOSIS — I4892 Unspecified atrial flutter: Secondary | ICD-10-CM | POA: Diagnosis not present

## 2015-09-05 DIAGNOSIS — Z87891 Personal history of nicotine dependence: Secondary | ICD-10-CM | POA: Diagnosis not present

## 2015-09-05 DIAGNOSIS — I10 Essential (primary) hypertension: Secondary | ICD-10-CM | POA: Diagnosis not present

## 2015-09-05 DIAGNOSIS — Z8673 Personal history of transient ischemic attack (TIA), and cerebral infarction without residual deficits: Secondary | ICD-10-CM | POA: Diagnosis not present

## 2015-09-05 DIAGNOSIS — K119 Disease of salivary gland, unspecified: Secondary | ICD-10-CM | POA: Diagnosis not present

## 2015-09-05 DIAGNOSIS — I480 Paroxysmal atrial fibrillation: Secondary | ICD-10-CM | POA: Diagnosis not present

## 2015-09-05 DIAGNOSIS — Z7901 Long term (current) use of anticoagulants: Secondary | ICD-10-CM | POA: Diagnosis not present

## 2015-09-05 DIAGNOSIS — M25569 Pain in unspecified knee: Secondary | ICD-10-CM | POA: Diagnosis not present

## 2015-09-11 DIAGNOSIS — I4892 Unspecified atrial flutter: Secondary | ICD-10-CM | POA: Diagnosis not present

## 2015-09-11 DIAGNOSIS — Z8673 Personal history of transient ischemic attack (TIA), and cerebral infarction without residual deficits: Secondary | ICD-10-CM | POA: Diagnosis not present

## 2015-09-11 DIAGNOSIS — I4510 Unspecified right bundle-branch block: Secondary | ICD-10-CM | POA: Diagnosis not present

## 2015-09-11 DIAGNOSIS — Z87891 Personal history of nicotine dependence: Secondary | ICD-10-CM | POA: Diagnosis not present

## 2015-09-11 DIAGNOSIS — I10 Essential (primary) hypertension: Secondary | ICD-10-CM | POA: Diagnosis not present

## 2015-09-11 DIAGNOSIS — I252 Old myocardial infarction: Secondary | ICD-10-CM | POA: Diagnosis not present

## 2015-09-11 DIAGNOSIS — Z7901 Long term (current) use of anticoagulants: Secondary | ICD-10-CM | POA: Diagnosis not present

## 2015-09-11 DIAGNOSIS — E119 Type 2 diabetes mellitus without complications: Secondary | ICD-10-CM | POA: Diagnosis not present

## 2015-09-11 DIAGNOSIS — I480 Paroxysmal atrial fibrillation: Secondary | ICD-10-CM | POA: Diagnosis not present

## 2015-09-14 ENCOUNTER — Other Ambulatory Visit: Payer: Self-pay | Admitting: Family Medicine

## 2015-09-16 ENCOUNTER — Other Ambulatory Visit: Payer: Self-pay | Admitting: *Deleted

## 2015-09-16 ENCOUNTER — Telehealth: Payer: Self-pay | Admitting: Family Medicine

## 2015-09-16 DIAGNOSIS — I4892 Unspecified atrial flutter: Secondary | ICD-10-CM | POA: Diagnosis not present

## 2015-09-16 DIAGNOSIS — I480 Paroxysmal atrial fibrillation: Secondary | ICD-10-CM | POA: Diagnosis not present

## 2015-09-16 DIAGNOSIS — I5032 Chronic diastolic (congestive) heart failure: Secondary | ICD-10-CM | POA: Diagnosis not present

## 2015-09-16 DIAGNOSIS — I10 Essential (primary) hypertension: Secondary | ICD-10-CM | POA: Diagnosis not present

## 2015-09-16 NOTE — Patient Outreach (Signed)
Spoke with pt, HIPPA verified.  Discussed with pt this RN CM now providing community nurse case management services, transitioned from previous RN CM [place].    Pt reports been doing good, discussed recent hospital discharge 3 days ago, in for heart- out in one day (surgery).   RN CM discussed doing a home visit to which pt agreed- home visit scheduled for 3/27.     Shayne Alkenose M.   Zarek Relph RN CCM [facility] Care Management  [phone]

## 2015-09-16 NOTE — Telephone Encounter (Signed)
Tresa EndoKelly states that patient was seen by Dr [place] today but is needing a referral for sleep study.

## 2015-09-17 ENCOUNTER — Other Ambulatory Visit: Payer: Self-pay | Admitting: Family Medicine

## 2015-09-17 NOTE — Telephone Encounter (Signed)
Please schedule patient for an appointment to place a referral for sleep study.

## 2015-09-18 NOTE — Telephone Encounter (Signed)
Appointment made for this coming Tuesday 09-23-15

## 2015-09-19 ENCOUNTER — Ambulatory Visit: Payer: Self-pay | Admitting: *Deleted

## 2015-09-19 DIAGNOSIS — N189 Chronic kidney disease, unspecified: Secondary | ICD-10-CM | POA: Diagnosis not present

## 2015-09-19 DIAGNOSIS — I2510 Atherosclerotic heart disease of native coronary artery without angina pectoris: Secondary | ICD-10-CM | POA: Diagnosis not present

## 2015-09-19 DIAGNOSIS — E1122 Type 2 diabetes mellitus with diabetic chronic kidney disease: Secondary | ICD-10-CM | POA: Diagnosis not present

## 2015-09-19 DIAGNOSIS — I130 Hypertensive heart and chronic kidney disease with heart failure and stage 1 through stage 4 chronic kidney disease, or unspecified chronic kidney disease: Secondary | ICD-10-CM | POA: Diagnosis not present

## 2015-09-19 DIAGNOSIS — I5032 Chronic diastolic (congestive) heart failure: Secondary | ICD-10-CM | POA: Diagnosis not present

## 2015-09-19 DIAGNOSIS — D631 Anemia in chronic kidney disease: Secondary | ICD-10-CM | POA: Diagnosis not present

## 2015-09-19 DIAGNOSIS — I4892 Unspecified atrial flutter: Secondary | ICD-10-CM | POA: Diagnosis not present

## 2015-09-19 DIAGNOSIS — M25562 Pain in left knee: Secondary | ICD-10-CM | POA: Diagnosis not present

## 2015-09-19 DIAGNOSIS — I480 Paroxysmal atrial fibrillation: Secondary | ICD-10-CM | POA: Diagnosis not present

## 2015-09-22 ENCOUNTER — Ambulatory Visit: Payer: Self-pay | Admitting: *Deleted

## 2015-09-23 ENCOUNTER — Ambulatory Visit (INDEPENDENT_AMBULATORY_CARE_PROVIDER_SITE_OTHER): Payer: Commercial Managed Care - HMO | Admitting: Family Medicine

## 2015-09-23 ENCOUNTER — Encounter: Payer: Self-pay | Admitting: Family Medicine

## 2015-09-23 VITALS — BP 140/62 | HR 57 | Temp 98.5°F | Resp 16 | Wt 204.0 lb

## 2015-09-23 DIAGNOSIS — E785 Hyperlipidemia, unspecified: Secondary | ICD-10-CM

## 2015-09-23 DIAGNOSIS — E1165 Type 2 diabetes mellitus with hyperglycemia: Secondary | ICD-10-CM | POA: Diagnosis not present

## 2015-09-23 DIAGNOSIS — E118 Type 2 diabetes mellitus with unspecified complications: Secondary | ICD-10-CM

## 2015-09-23 DIAGNOSIS — I10 Essential (primary) hypertension: Secondary | ICD-10-CM | POA: Diagnosis not present

## 2015-09-23 DIAGNOSIS — IMO0002 Reserved for concepts with insufficient information to code with codable children: Secondary | ICD-10-CM

## 2015-09-23 LAB — GLUCOSE, POCT (MANUAL RESULT ENTRY): POC Glucose: 179 mg/dl — AB (ref 70–99)

## 2015-09-23 LAB — POCT GLYCOSYLATED HEMOGLOBIN (HGB A1C): HEMOGLOBIN A1C: 8.2

## 2015-09-23 MED ORDER — HYDRALAZINE HCL 25 MG PO TABS: 25.0000 mg | ORAL_TABLET | Freq: Three times a day (TID) | ORAL | Status: DC

## 2015-09-23 NOTE — Progress Notes (Signed)
Name: [name]   MRN: [id number]    DOB: 05/23/1946   Date:09/23/2015       Progress Note  Subjective  Chief Complaint  Chief Complaint  Patient presents with  . Medication Refill  . Hypertension    HPI  Hypertension: BP is controlled, no symptoms of chest pain, blurry vision, headaches. Taking all medications as directed.  Diabetes Mellitus: Lat A1c was 7.6%, doing well, AM fasting sugars are generally 100 mg/dL. Taking Glipizide as directed.    Hyperlipidemia: FLP obtained in November 2016 was at goal, continues to take Simvastatin 10 mg at bedtime. No side effects.    Past Medical History  Diagnosis Date  . Ventricular tachycardia (HCC) 05/20/2014  . Atrial fibrillation and flutter (HCC)   . GI bleeding   . Gastric ulcer   . Diabetes mellitus with complication (HCC)   . Essential hypertension   . Myocardial infarction (HCC)   . CHF (congestive heart failure) [facility])     Past Surgical History  Procedure Laterality Date  . Left heart catheterization with coronary angiogram N/A 05/20/2014    Procedure: LEFT HEART CATHETERIZATION WITH CORONARY ANGIOGRAM;  Surgeon: Micheline Chapman, MD;  Location: [facility] CATH LAB;  Service: Cardiovascular;  Laterality: N/A;  . Hernia repair    . Electrophysiologic study N/A 07/28/2015    Procedure: Cardioversion;  Surgeon: Lamar Blinks, MD;  Location: [facility] ORS;  Service: Cardiovascular;  Laterality: N/A;    Family History  Problem Relation Age of Onset  . Congestive Heart Failure Mother     Social History   Social History  . Marital Status: Married    Spouse Name: N/A  . Number of Children: N/A  . Years of Education: N/A   Occupational History  . Not on file.   Social History Main Topics  . Smoking status: Former Smoker    Quit date: 04/28/1964  . Smokeless tobacco: Never Used  . Alcohol Use: No  . Drug Use: No  . Sexual Activity: Yes   Other Topics Concern  . Not on file   Social History Narrative   Married,  lives in [place]. Retired from [employer] work. Nonsmoker, no Etoh.     Current outpatient prescriptions:  .  hydrALAZINE (APRESOLINE) 25 MG tablet, Take 25 mg by mouth 3 (three) times daily., Disp: , Rfl:  .  amiodarone (PACERONE) 400 MG tablet, Take 1 tablet (400 mg total) by mouth daily., Disp: 30 tablet, Rfl: 6 .  diltiazem (CARDIZEM CD) 240 MG 24 hr capsule, Take 1 capsule (240 mg total) by mouth daily., Disp: 30 capsule, Rfl: 0 .  glipiZIDE (GLUCOTROL) 10 MG tablet, Take 1 tablet (10 mg total) by mouth 2 (two) times daily before a meal., Disp: 60 tablet, Rfl: 0 .  metoprolol tartrate (LOPRESSOR) 25 MG tablet, TAKE ONE TABLET BY MOUTH TWICE DAILY, Disp: 180 tablet, Rfl: 0 .  oxyCODONE-acetaminophen (ROXICET) 5-325 MG tablet, Take 1 tablet by mouth every 4 (four) hours as needed for severe pain., Disp: 10 tablet, Rfl: 0 .  simvastatin (ZOCOR) 10 MG tablet, Take 10 mg by mouth at bedtime., Disp: , Rfl:  .  valsartan-hydrochlorothiazide (DIOVAN-HCT) 320-25 MG tablet, Take 1 tablet by mouth daily., Disp: , Rfl:  .  warfarin (COUMADIN) 5 MG tablet, Take 2.5-5 mg by mouth daily. Pt takes one tablet Monday-Friday and one-half tablet Saturday and Sunday., Disp: , Rfl:   No Known Allergies   Review of Systems  Constitutional: Negative for fever and chills.  Eyes: Negative for blurred vision and double vision.  Respiratory: Negative for cough.   Cardiovascular: Negative for chest pain and palpitations.  Musculoskeletal: Positive for myalgias (sometimes have muscle aches, not sure if its a statin SE.).  Neurological: Negative for headaches.    Objective  Filed Vitals:   09/23/15 1348  BP: 140/62  Pulse: 57  Temp: 98.5 F (36.9 C)  TempSrc: Oral  Resp: 16  Weight: 204 lb (92.534 kg)  SpO2: 97%    Physical Exam  Constitutional: He is oriented to person, place, and time and well-developed, well-nourished, and in no distress.  HENT:  Head: Normocephalic and atraumatic.   Cardiovascular: Normal rate and regular rhythm.   Pulmonary/Chest: Effort normal and breath sounds normal.  Abdominal: Soft. Bowel sounds are normal.  Neurological: He is alert and oriented to person, place, and time.  Nursing note and vitals reviewed.   Assessment & Plan  1. Essential hypertension Blood pressure stable, refills for hydralazine provided to - hydrALAZINE (APRESOLINE) 25 MG tablet; Take 1 tablet (25 mg total) by mouth 3 (three) times daily.  Dispense: 90 tablet; Refill: 2  2. Uncontrolled type 2 diabetes mellitus with complication, without long-term current use of insulin (HCC) A1c is 8.2%, worse from 7.6%. We'll add second agent for diabetes. - POCT HgB A1C - POCT Glucose (CBG)  3. Dyslipidemia  - Comprehensive Metabolic Panel (CMET) - Lipid Profile   Bahar Shelden Asad A. Faylene KurtzShah [facility] 09/23/2015 2:07 PM

## 2015-09-24 DIAGNOSIS — E785 Hyperlipidemia, unspecified: Secondary | ICD-10-CM | POA: Diagnosis not present

## 2015-09-25 ENCOUNTER — Telehealth: Payer: Self-pay

## 2015-09-25 LAB — COMPREHENSIVE METABOLIC PANEL
ALK PHOS: 59 IU/L (ref 39–117)
ALT: 25 IU/L (ref 0–44)
AST: 21 IU/L (ref 0–40)
Albumin/Globulin Ratio: 1.5 (ref 1.2–2.2)
Albumin: 4.1 g/dL (ref 3.5–4.8)
BILIRUBIN TOTAL: 0.3 mg/dL (ref 0.0–1.2)
BUN / CREAT RATIO: 10 (ref 10–22)
BUN: 15 mg/dL (ref 8–27)
CHLORIDE: 105 mmol/L (ref 96–106)
CO2: 24 mmol/L (ref 18–29)
Calcium: 9.4 mg/dL (ref 8.6–10.2)
Creatinine, Ser: 1.48 mg/dL — ABNORMAL HIGH (ref 0.76–1.27)
GFR calc Af Amer: 54 mL/min/{1.73_m2} — ABNORMAL LOW (ref >59)
GFR calc non Af Amer: 47 mL/min/{1.73_m2} — ABNORMAL LOW (ref >59)
GLUCOSE: 130 mg/dL — AB (ref 65–99)
Globulin, Total: 2.8 g/dL (ref 1.5–4.5)
Potassium: 4.8 mmol/L (ref 3.5–5.2)
Sodium: 146 mmol/L — ABNORMAL HIGH (ref 134–144)
Total Protein: 6.9 g/dL (ref 6.0–8.5)

## 2015-09-25 LAB — LIPID PANEL
CHOLESTEROL TOTAL: 153 mg/dL (ref 100–199)
Chol/HDL Ratio: 3.1 ratio units (ref 0.0–5.0)
HDL: 50 mg/dL (ref >39)
LDL Calculated: 89 mg/dL (ref 0–99)
Triglycerides: 69 mg/dL (ref 0–149)
VLDL CHOLESTEROL CAL: 14 mg/dL (ref 5–40)

## 2015-09-25 MED ORDER — SITAGLIPTIN PHOSPHATE 50 MG PO TABS: 50.0000 mg | ORAL_TABLET | Freq: Every day | ORAL | Status: DC

## 2015-09-25 NOTE — Telephone Encounter (Signed)
Lab results have been reported to patient and a prescription for Januvia 50 mg daily #90 has been sent to [facility] [address] per Dr. Sherryll BurgerShah, patient has been notified and verbalized understanding

## 2015-09-26 ENCOUNTER — Encounter: Payer: Self-pay | Admitting: *Deleted

## 2015-09-26 ENCOUNTER — Other Ambulatory Visit: Payer: Self-pay | Admitting: *Deleted

## 2015-09-27 ENCOUNTER — Encounter: Payer: Self-pay | Admitting: *Deleted

## 2015-09-27 NOTE — Patient Outreach (Signed)
[facility]) Care Management   09/27/2015  [name] 12/11/1945 [id number]  [name] is an 70 y.o. male  Subjective: Pt reports on visit with Dr. Sherryll BurgerShah, A1C up, started on new diabetic medication (Januvia), to pick up rx today.   Pt reports checks his sugars every other day, uses a different glucometer when at brother's home.  Spouse reports pt has a lot of bread.   Pt reports sob with exertion, will need to sit down and  rest - was told that would happen (view in Epic- ablation done 3/16).   Objective:   Filed Vitals:   09/26/15 1435  BP: 144/70  Pulse: 74  Resp: 20    ROS  Physical Exam  Constitutional: He is oriented to person, place, and time. He appears well-developed and well-nourished.  Cardiovascular: Normal rate.   Irregular heart rate.   Respiratory: Effort normal and breath sounds normal.  GI: Soft. Bowel sounds are normal.  Musculoskeletal: Normal range of motion.  Neurological: He is alert and oriented to person, place, and time.  Skin: Skin is warm and dry.  Psychiatric: He has a normal mood and affect. His behavior is normal. Judgment and thought content normal.    Current Medications:  Reviewed with pt  Current Outpatient Prescriptions  Medication Sig Dispense Refill  . diltiazem (CARDIZEM CD) 240 MG 24 hr capsule Take 1 capsule (240 mg total) by mouth daily. 30 capsule 0  . glipiZIDE (GLUCOTROL) 10 MG tablet Take 1 tablet (10 mg total) by mouth 2 (two) times daily before a meal. 60 tablet 0  . hydrALAZINE (APRESOLINE) 25 MG tablet Take 1 tablet (25 mg total) by mouth 3 (three) times daily. 90 tablet 2  . metoprolol tartrate (LOPRESSOR) 25 MG tablet TAKE ONE TABLET BY MOUTH TWICE DAILY 180 tablet 0  . nitroGLYCERIN (NITROSTAT) 0.4 MG SL tablet Place 0.4 mg under the tongue every 5 (five) minutes as needed.    . simvastatin (ZOCOR) 10 MG tablet Take 10 mg by mouth at bedtime.    . valsartan-hydrochlorothiazide (DIOVAN-HCT) 320-25  MG tablet Take 1 tablet by mouth daily.    Marland Kitchen. warfarin (COUMADIN) 5 MG tablet Take 2.5-5 mg by mouth daily. Pt takes one tablet Monday-Friday and one-half tablet Saturday and Sunday.    Marland Kitchen. amiodarone (PACERONE) 400 MG tablet Take 1 tablet (400 mg total) by mouth daily. (Patient not taking: Reported on 09/26/2015) 30 tablet 6  . oxyCODONE-acetaminophen (ROXICET) 5-325 MG tablet Take 1 tablet by mouth every 4 (four) hours as needed for severe pain. (Patient not taking: Reported on 09/26/2015) 10 tablet 0  . sitaGLIPtin (JANUVIA) 50 MG tablet Take 1 tablet (50 mg total) by mouth daily. (Patient not taking: Reported on 09/26/2015) 90 tablet 0   No current facility-administered medications for this visit.    Functional Status:   In your present state of health, do you have any difficulty performing the following activities: 09/26/2015 07/16/2015  Hearing? N -  Vision? N -  Difficulty concentrating or making decisions? N -  Walking or climbing stairs? N -  Dressing or bathing? N -  Doing errands, shopping? N N  Preparing Food and eating ? N -  Using the Toilet? N -  In the past six months, have you accidently leaked urine? N -  Do you have problems with loss of bowel control? N -  Managing your Medications? N -  Managing your Finances? N -  Housekeeping or managing your Housekeeping?  N -    Fall/Depression Screening:    PHQ 2/9 Scores 09/26/2015 08/08/2015 07/16/2015 05/09/2015 02/05/2015  PHQ - 2 Score 0 0 0 0 0    Assessment:  Pleasant 70 year old gentleman, resides with spouse (present during home visit).  No c/o pain.                           DM- view of pt's glucometer, sugar today 169.   Pt reports using 2 glucometer, checks sugars                                 Every other day, sugars 104,105,127.   Pt needs to start new diabetic medication- Januvia.   Plan:  Pt to pick up rx Januvia, start.             Pt to check sugars often using same glucometer.             Plan to continue to  provide community nurse case management services, next h/v 4/27.             Plan to inform Dr. Sherryll Burger of [facility] involvement- via in basket in Epic both  barrier letter/ 3/31 encounter.     [facility] CM Care Plan Problem One        Most Recent Value   Care Plan Problem One  A1C went from 7.6 to 8.2    Role Documenting the Problem One  Care Management Coordinator   Care Plan for Problem One  Active   [facility] Long Term Goal (31-90 days)  Pt A1C would decrease one point in 90 days    [facility] Long Term Goal Start Date  09/26/15   Interventions for Problem One Long Term Goal  Provided pt with information on DM- managing diet/carbohydrates, meaning of A1C.    [facility] CM Short Term Goal #1 (0-30 days)  Pt would use same glucometer for the next 30 days    [facility] CM Short Term Goal #1 Start Date  09/26/15   Interventions for Short Term Goal #1  Discussed with pt using same glucometer= accurate averages.    [facility] CM Short Term Goal #2 (0-30 days)  Pt would decrease his carbohydrates in the next 30 days    [facility] CM Short Term Goal #2 Start Date  09/26/15   Interventions for Short Term Goal #2  Discussed with pt use of good carbohydrates, limit 3-4 servings per meal, include protein when having a carbohydrate.      Shayne Alken.   Pierzchala RN CCM [facility] Care Management  [phone]

## 2015-10-03 ENCOUNTER — Other Ambulatory Visit: Payer: Self-pay | Admitting: Family Medicine

## 2015-10-14 DIAGNOSIS — I4891 Unspecified atrial fibrillation: Secondary | ICD-10-CM | POA: Diagnosis not present

## 2015-10-20 DIAGNOSIS — I480 Paroxysmal atrial fibrillation: Secondary | ICD-10-CM | POA: Diagnosis not present

## 2015-10-20 DIAGNOSIS — I739 Peripheral vascular disease, unspecified: Secondary | ICD-10-CM | POA: Diagnosis not present

## 2015-10-20 DIAGNOSIS — I10 Essential (primary) hypertension: Secondary | ICD-10-CM | POA: Diagnosis not present

## 2015-10-20 DIAGNOSIS — I5032 Chronic diastolic (congestive) heart failure: Secondary | ICD-10-CM | POA: Diagnosis not present

## 2015-10-23 ENCOUNTER — Other Ambulatory Visit: Payer: Self-pay | Admitting: *Deleted

## 2015-10-23 ENCOUNTER — Ambulatory Visit: Payer: Self-pay | Admitting: *Deleted

## 2015-10-23 NOTE — Patient Outreach (Signed)
Arrived at pt's home for scheduled visit, knocked on door several times, no answer.  Called pt on phone, no answer, unable to leave voice message.  Will attempt to call pt again, reschedule home visit.    Shayne Alkenose M.   Pierzchala RN CCM [facility] Care [facility]  [phone]

## 2015-10-30 ENCOUNTER — Other Ambulatory Visit: Payer: Self-pay | Admitting: Family Medicine

## 2015-11-04 ENCOUNTER — Encounter: Payer: Self-pay | Admitting: *Deleted

## 2015-11-04 ENCOUNTER — Other Ambulatory Visit: Payer: Self-pay | Admitting: *Deleted

## 2015-11-04 NOTE — Patient Outreach (Addendum)
Follow up phone call.  Spoke with pt, discussed missed home visit with RN CM 4/27 to which [facility] reports [facility], has [facility], cath done, stayed one day.  Pt reports adjustments were made to his medication - manage heart rate.    RN CM discussed scheduling another home visit to review diabetic educational material provided at last home visit.  Pt reports does not need a visit, doing okay with his diabetes.  Pt reports saw Dr. Sherryll BurgerShah 2 weeks ago, A1C went down, think it was a 6.  Pt reports sugars have been good since starting Januvia, this morning sugar was 96, ranges 125-130.  Pt reports he has been using the same glucometer to check his sugars, watching his carbohydrates, going by the book  RN CM provided.  Discussed with pt closing case to which pt agreed- no further case management needs. Pt reports he  has RN CM's number to call if needed.    Plan to inform Dr. Sherryll BurgerShah discharging pt from RN CM services, send  case closure letter by in basket  Send pt case closure letter.  [facility] to inform [facility]  Care management assistant to close case.    Shayne Alkenose M.   Pierzchala RN CCM [facility] Care [facility]  [phone]

## 2015-12-24 ENCOUNTER — Ambulatory Visit: Payer: Commercial Managed Care - HMO | Admitting: Family Medicine

## 2015-12-25 ENCOUNTER — Ambulatory Visit: Payer: Commercial Managed Care - HMO | Admitting: Family Medicine

## 2015-12-26 ENCOUNTER — Other Ambulatory Visit: Payer: Self-pay | Admitting: Family Medicine

## 2015-12-29 ENCOUNTER — Ambulatory Visit (INDEPENDENT_AMBULATORY_CARE_PROVIDER_SITE_OTHER): Payer: Commercial Managed Care - HMO | Admitting: Family Medicine

## 2015-12-29 ENCOUNTER — Telehealth: Payer: Self-pay

## 2015-12-29 ENCOUNTER — Encounter: Payer: Self-pay | Admitting: Family Medicine

## 2015-12-29 VITALS — BP 146/69 | HR 60 | Temp 97.8°F | Resp 15 | Ht 71.0 in | Wt 208.9 lb

## 2015-12-29 DIAGNOSIS — E785 Hyperlipidemia, unspecified: Secondary | ICD-10-CM

## 2015-12-29 DIAGNOSIS — N183 Chronic kidney disease, stage 3 (moderate): Secondary | ICD-10-CM

## 2015-12-29 DIAGNOSIS — IMO0002 Reserved for concepts with insufficient information to code with codable children: Secondary | ICD-10-CM

## 2015-12-29 DIAGNOSIS — E1122 Type 2 diabetes mellitus with diabetic chronic kidney disease: Secondary | ICD-10-CM

## 2015-12-29 DIAGNOSIS — E1165 Type 2 diabetes mellitus with hyperglycemia: Secondary | ICD-10-CM

## 2015-12-29 DIAGNOSIS — I10 Essential (primary) hypertension: Secondary | ICD-10-CM

## 2015-12-29 LAB — GLUCOSE, POCT (MANUAL RESULT ENTRY): POC Glucose: 90 mg/dl (ref 70–99)

## 2015-12-29 LAB — POCT GLYCOSYLATED HEMOGLOBIN (HGB A1C): Hemoglobin A1C: 8.1

## 2015-12-29 MED ORDER — SITAGLIPTIN PHOSPHATE 100 MG PO TABS: 100.0000 mg | ORAL_TABLET | Freq: Every day | ORAL | Status: DC

## 2015-12-29 NOTE — Progress Notes (Signed)
Name: [name]   MRN: [id number]    DOB: 11/14/1945   Date:12/29/2015       Progress Note  Subjective  Chief Complaint  Chief Complaint  Patient presents with  . Follow-up    3 mo    Hypertension This is a chronic problem. The problem is controlled. Pertinent negatives include no blurred vision, chest pain, headaches, palpitations or shortness of breath. Past treatments include angiotensin blockers, diuretics, beta blockers, calcium channel blockers and direct vasodilators. Hypertensive end-organ damage includes kidney disease. There is no history of CAD/MI or CVA.  Diabetes He presents for his follow-up diabetic visit. He has type 2 diabetes mellitus. His disease course has been worsening. Pertinent negatives for hypoglycemia include no headaches. Pertinent negatives for diabetes include no blurred vision and no chest pain. Symptoms are worsening. Pertinent negatives for diabetic complications include no CVA. Current diabetic treatment includes oral agent (dual therapy). His breakfast blood glucose range is generally 110-130 mg/dl.  Hyperlipidemia This is a chronic problem. The problem is controlled. Recent lipid tests were reviewed and are normal. Pertinent negatives include no chest pain, myalgias or shortness of breath. Current antihyperlipidemic treatment includes statins.     Past Medical History  Diagnosis Date  . Ventricular tachycardia (HCC) 05/20/2014  . Atrial fibrillation and flutter (HCC)   . GI bleeding   . Gastric ulcer   . Diabetes mellitus with complication (HCC)   . Essential hypertension   . Myocardial infarction (HCC)   . CHF (congestive heart failure) [facility](HCC)     Past Surgical History  Procedure Laterality Date  . Left heart catheterization with coronary angiogram N/A 05/20/2014    Procedure: LEFT HEART CATHETERIZATION WITH CORONARY ANGIOGRAM;  Surgeon: Micheline ChapmanMichael D Cooper, MD;  [facility];  Service: Cardiovascular;  Laterality: N/A;  . Hernia  repair    . Electrophysiologic study N/A 07/28/2015    Procedure: Cardioversion;  Surgeon: Lamar BlinksBruce J Kowalski, MD;  Location: [facility] ORS;  Service: Cardiovascular;  Laterality: N/A;    Family History  Problem Relation Age of Onset  . Congestive Heart Failure Mother     Social History   Social History  . Marital Status: Married    Spouse Name: N/A  . Number of Children: N/A  . Years of Education: N/A   Occupational History  . Not on file.   Social History Main Topics  . Smoking status: Former Smoker    Quit date: 04/28/1964  . Smokeless tobacco: Never Used  . Alcohol Use: No  . Drug Use: No  . Sexual Activity: Yes   Other Topics Concern  . Not on file   Social History Narrative   Married, [place]. Retired from [employer] work. Nonsmoker, no Etoh.     Current outpatient prescriptions:  .  amiodarone (PACERONE) 400 MG tablet, Take 1 tablet (400 mg total) by mouth daily., Disp: 30 tablet, Rfl: 6 .  diltiazem (CARDIZEM CD) 240 MG 24 hr capsule, Take 1 capsule (240 mg total) by mouth daily., Disp: 30 capsule, Rfl: 0 .  glipiZIDE (GLUCOTROL) 10 MG tablet, TAKE ONE TABLET BY MOUTH TWICE DAILY BEFORE  A  MEAL, Disp: 60 tablet, Rfl: 2 .  hydrALAZINE (APRESOLINE) 25 MG tablet, TAKE ONE TABLET BY MOUTH THREE TIMES DAILY, Disp: 90 tablet, Rfl: 0 .  metoprolol tartrate (LOPRESSOR) 25 MG tablet, TAKE ONE TABLET BY MOUTH TWICE DAILY, Disp: 180 tablet, Rfl: 0 .  nitroGLYCERIN (NITROSTAT) 0.4 MG SL tablet, Place 0.4 mg under the  tongue every 5 (five) minutes as needed., Disp: , Rfl:  .  oxyCODONE-acetaminophen (ROXICET) 5-325 MG tablet, Take 1 tablet by mouth every 4 (four) hours as needed for severe pain., Disp: 10 tablet, Rfl: 0 .  simvastatin (ZOCOR) 10 MG tablet, Take 10 mg by mouth at bedtime., Disp: , Rfl:  .  sitaGLIPtin (JANUVIA) 50 MG tablet, Take 1 tablet (50 mg total) by mouth daily., Disp: 90 tablet, Rfl: 0 .  valsartan-hydrochlorothiazide (DIOVAN-HCT) 320-25 MG tablet, Take  1 tablet by mouth daily., Disp: , Rfl:  .  warfarin (COUMADIN) 5 MG tablet, Take 2.5-5 mg by mouth daily. Pt takes one tablet Monday-Friday and one-half tablet Saturday and Sunday., Disp: , Rfl:   No Known Allergies   Review of Systems  Eyes: Negative for blurred vision.  Respiratory: Negative for shortness of breath.   Cardiovascular: Negative for chest pain and palpitations.  Musculoskeletal: Negative for myalgias.  Neurological: Negative for headaches.    Objective  Filed Vitals:   12/29/15 1122  BP: 146/69  Pulse: 60  Temp: 97.8 F (36.6 C)  TempSrc: Oral  Resp: 15  Height: 5\' 11"  (1.803 m)  Weight: 208 lb 14.4 oz (94.756 kg)  SpO2: 98%    Physical Exam  Constitutional: He is oriented to person, place, and time and well-developed, well-nourished, and in no distress.  HENT:  Head: Normocephalic and atraumatic.  Cardiovascular: Normal rate, regular rhythm and normal heart sounds.   No murmur heard. Pulmonary/Chest: Effort normal and breath sounds normal. He has no wheezes.  Abdominal: Soft. Bowel sounds are normal.  Musculoskeletal: He exhibits no edema.  Neurological: He is alert and oriented to person, place, and time.  Psychiatric: Mood, memory, affect and judgment normal.  Nursing note and vitals reviewed.      Assessment & Plan  1. Uncontrolled type 2 diabetes mellitus with stage 3 chronic kidney disease, without long-term current use of insulin (HCC) A1c is 8.1%, consistent with poorly controlled diabetes. We'll add Januvia 100 mg daily to patient's diabetes regimen. Obtain urine microalbumin for evaluation of nephropathy - POCT HgB A1C - POCT Glucose (CBG) - Urine Microalbumin w/creat. ratio   2. Dyslipidemia LDL above goal, we will change from simvastatin to atorvastatin 20 mg at bedtime. Prescription sent to pharmacy. Recheck FLP in 3 months - atorvastatin (LIPITOR) 20 MG tablet; Take 1 tablet (20 mg total) by mouth daily at 6 PM.  Dispense: 90  tablet; Refill: 1  3. Essential hypertension Elevated blood pressure, no change in pharmacotherapy for now, reassess in one month.   Kissie Ziolkowski Asad A. Faylene KurtzShah [facility] 12/29/2015 11:39 AM

## 2015-12-29 NOTE — Telephone Encounter (Signed)
New prescription for Sitagliptin Alma Friendly(januvia) 100 mg has been sent to [facility] [address] per Dr. Sherryll BurgerShah

## 2015-12-30 LAB — MICROALBUMIN / CREATININE URINE RATIO
CREATININE, UR: 246.8 mg/dL
MICROALB/CREAT RATIO: 458.8 mg/g creat — ABNORMAL HIGH (ref 0.0–30.0)
MICROALBUM., U, RANDOM: 1132.4 ug/mL

## 2015-12-31 MED ORDER — ATORVASTATIN CALCIUM 20 MG PO TABS: 20.0000 mg | ORAL_TABLET | Freq: Every day | ORAL | Status: DC

## 2016-01-17 ENCOUNTER — Other Ambulatory Visit: Payer: Self-pay | Admitting: Family Medicine

## 2016-01-27 ENCOUNTER — Other Ambulatory Visit: Payer: Self-pay | Admitting: Family Medicine

## 2016-02-04 DIAGNOSIS — I4891 Unspecified atrial fibrillation: Secondary | ICD-10-CM | POA: Diagnosis not present

## 2016-03-03 DIAGNOSIS — I4891 Unspecified atrial fibrillation: Secondary | ICD-10-CM | POA: Diagnosis not present

## 2016-03-04 ENCOUNTER — Other Ambulatory Visit: Payer: Self-pay | Admitting: Family Medicine

## 2016-03-30 ENCOUNTER — Ambulatory Visit: Payer: Commercial Managed Care - HMO | Admitting: Family Medicine

## 2016-04-01 DIAGNOSIS — I4891 Unspecified atrial fibrillation: Secondary | ICD-10-CM | POA: Diagnosis not present

## 2016-04-07 ENCOUNTER — Other Ambulatory Visit: Payer: Self-pay | Admitting: Family Medicine

## 2016-04-09 ENCOUNTER — Other Ambulatory Visit: Payer: Self-pay | Admitting: Family Medicine

## 2016-04-09 MED ORDER — HYDRALAZINE HCL 25 MG PO TABS
25.0000 mg | ORAL_TABLET | Freq: Three times a day (TID) | ORAL | 0 refills | Status: DC
Start: 2016-04-09 — End: 2016-04-12

## 2016-04-09 NOTE — Telephone Encounter (Signed)
Medication has been refilled and sent to [facility] [address] 

## 2016-04-09 NOTE — Telephone Encounter (Signed)
Patient has appointment for Monday 04/12/16. He is asking that you please give him enough pills for hydralazine, he is completely out. Please send to [facility].

## 2016-04-12 ENCOUNTER — Encounter: Payer: Self-pay | Admitting: Family Medicine

## 2016-04-12 ENCOUNTER — Ambulatory Visit (INDEPENDENT_AMBULATORY_CARE_PROVIDER_SITE_OTHER): Payer: Commercial Managed Care - HMO | Admitting: Family Medicine

## 2016-04-12 VITALS — BP 138/73 | HR 60 | Temp 97.7°F | Resp 16 | Ht 71.0 in | Wt 211.0 lb

## 2016-04-12 DIAGNOSIS — E118 Type 2 diabetes mellitus with unspecified complications: Secondary | ICD-10-CM

## 2016-04-12 DIAGNOSIS — E1165 Type 2 diabetes mellitus with hyperglycemia: Secondary | ICD-10-CM

## 2016-04-12 DIAGNOSIS — IMO0002 Reserved for concepts with insufficient information to code with codable children: Secondary | ICD-10-CM

## 2016-04-12 DIAGNOSIS — E785 Hyperlipidemia, unspecified: Secondary | ICD-10-CM

## 2016-04-12 DIAGNOSIS — I10 Essential (primary) hypertension: Secondary | ICD-10-CM

## 2016-04-12 LAB — POCT UA - MICROALBUMIN
ALBUMIN/CREATININE RATIO, URINE, POC: NEGATIVE
CREATININE, POC: NEGATIVE mg/dL
Microalbumin Ur, POC: NEGATIVE mg/L

## 2016-04-12 LAB — GLUCOSE, POCT (MANUAL RESULT ENTRY): POC Glucose: 111 mg/dl — AB (ref 70–99)

## 2016-04-12 LAB — POCT GLYCOSYLATED HEMOGLOBIN (HGB A1C): Hemoglobin A1C: 8.5

## 2016-04-12 MED ORDER — HYDRALAZINE HCL 25 MG PO TABS: 25.0000 mg | ORAL_TABLET | Freq: Three times a day (TID) | ORAL | 0 refills | Status: DC

## 2016-04-12 MED ORDER — PREGABALIN 50 MG PO CAPS: 50.0000 mg | ORAL_CAPSULE | Freq: Three times a day (TID) | ORAL | 0 refills | Status: DC

## 2016-04-12 MED ORDER — ROSUVASTATIN CALCIUM 20 MG PO TABS: 20.0000 mg | ORAL_TABLET | Freq: Every day | ORAL | 0 refills | Status: DC

## 2016-04-12 NOTE — Progress Notes (Signed)
Name: Jay ClementWilliam H [name]   MRN: [id number]    DOB: 06/03/1946   Date:04/12/2016       Progress Note  Subjective  Chief Complaint  Chief Complaint  Patient presents with  . Follow-up    3 mo  . Medication Refill    Diabetes  He presents for his follow-up diabetic visit. He has type 2 diabetes mellitus. His disease course has been worsening. Hypoglycemia symptoms include dizziness, nervousness/anxiousness, sleepiness and tremors. Pertinent negatives for hypoglycemia include no headaches. Pertinent negatives for diabetes include no blurred vision, no chest pain and no fatigue. Diabetic complications include heart disease and peripheral neuropathy. Pertinent negatives for diabetic complications include no CVA. Current diabetic treatment includes oral agent (dual therapy). His weight is stable. He is following a diabetic diet. He participates in exercise daily. His breakfast blood glucose range is generally 70-90 mg/dl. His lunch blood glucose range is generally 130-140 mg/dl. An ACE inhibitor/angiotensin II receptor blocker is being taken. Eye exam is not current.  Hypertension  This is a chronic problem. The problem is controlled. Pertinent negatives include no blurred vision, chest pain, headaches, palpitations or shortness of breath. Past treatments include angiotensin blockers, diuretics, beta blockers, calcium channel blockers and direct vasodilators. Hypertensive end-organ damage includes kidney disease. There is no history of CAD/MI or CVA.  Hyperlipidemia  This is a chronic problem. The problem is controlled. Recent lipid tests were reviewed and are normal. Pertinent negatives include no chest pain, myalgias or shortness of breath. Current antihyperlipidemic treatment includes statins. Risk factors for coronary artery disease include diabetes mellitus and hypertension.     Past Medical History:  Diagnosis Date  . Atrial fibrillation and flutter (HCC)   . CHF (congestive heart failure)  (HCC)   . Diabetes mellitus with complication (HCC)   . Essential hypertension   . Gastric ulcer   . GI bleeding   . Myocardial infarction   . Ventricular tachycardia (HCC) 05/20/2014    Past Surgical History:  Procedure Laterality Date  . ELECTROPHYSIOLOGIC STUDY N/A 07/28/2015   Procedure: Cardioversion;  Surgeon: Lamar BlinksBruce J Kowalski, MD;  Location: [facility] ORS;  Service: Cardiovascular;  Laterality: N/A;  . HERNIA REPAIR    . LEFT HEART CATHETERIZATION WITH CORONARY ANGIOGRAM N/A 05/20/2014   Procedure: LEFT HEART CATHETERIZATION WITH CORONARY ANGIOGRAM;  Surgeon: Micheline ChapmanMichael D Cooper, MD;  [facility];  Service: Cardiovascular;  Laterality: N/A;    Family History  Problem Relation Age of Onset  . Congestive Heart Failure Mother     Social History   Social History  . Marital status: Married    Spouse name: N/A  . Number of children: N/A  . Years of education: N/A   Occupational History  . Not on file.   Social History Main Topics  . Smoking status: Former Smoker    Quit date: 04/28/1964  . Smokeless tobacco: Never Used  . Alcohol use No  . Drug use: No  . Sexual activity: Yes   Other Topics Concern  . Not on file   Social History Narrative   Married, lives in [place]. Retired from [employer] work. Nonsmoker, no Etoh.     Current Outpatient Prescriptions:  .  amiodarone (PACERONE) 400 MG tablet, Take 1 tablet (400 mg total) by mouth daily., Disp: 30 tablet, Rfl: 6 .  atorvastatin (LIPITOR) 20 MG tablet, Take 1 tablet (20 mg total) by mouth daily at 6 PM., Disp: 90 tablet, Rfl: 1 .  diltiazem (CARDIZEM CD) 240 MG 24 hr  capsule, Take 1 capsule (240 mg total) by mouth daily., Disp: 30 capsule, Rfl: 0 .  glipiZIDE (GLUCOTROL) 10 MG tablet, TAKE ONE TABLET BY MOUTH TWICE DAILY BEFORE  A  MEAL., Disp: 180 tablet, Rfl: 0 .  hydrALAZINE (APRESOLINE) 25 MG tablet, Take 1 tablet (25 mg total) by mouth 3 (three) times daily., Disp: 30 tablet, Rfl: 0 .  metoprolol tartrate  (LOPRESSOR) 25 MG tablet, TAKE ONE TABLET BY MOUTH TWICE DAILY, Disp: 180 tablet, Rfl: 0 .  nitroGLYCERIN (NITROSTAT) 0.4 MG SL tablet, Place 0.4 mg under the tongue every 5 (five) minutes as needed., Disp: , Rfl:  .  oxyCODONE-acetaminophen (ROXICET) 5-325 MG tablet, Take 1 tablet by mouth every 4 (four) hours as needed for severe pain., Disp: 10 tablet, Rfl: 0 .  sitaGLIPtin (JANUVIA) 100 MG tablet, Take 1 tablet (100 mg total) by mouth daily., Disp: 90 tablet, Rfl: 0 .  valsartan-hydrochlorothiazide (DIOVAN-HCT) 320-25 MG tablet, Take 1 tablet by mouth daily., Disp: , Rfl:  .  warfarin (COUMADIN) 5 MG tablet, Take 2.5-5 mg by mouth daily. Pt takes one tablet Monday-Friday and one-half tablet Saturday and Sunday., Disp: , Rfl:   No Known Allergies   Review of Systems  Constitutional: Negative for fatigue.  Eyes: Negative for blurred vision.  Respiratory: Negative for shortness of breath.   Cardiovascular: Negative for chest pain and palpitations.  Musculoskeletal: Negative for myalgias.  Neurological: Positive for dizziness and tremors. Negative for headaches.  Psychiatric/Behavioral: The patient is nervous/anxious.     Objective  Vitals:   04/12/16 1340  BP: 138/73  Pulse: 60  Resp: 16  Temp: 97.7 F (36.5 C)  TempSrc: Oral  SpO2: 98%  Weight: 211 lb (95.7 kg)  Height: 5\' 11"  (1.803 m)    Physical Exam  Constitutional: He is well-developed, well-nourished, and in no distress.  Cardiovascular: Normal rate, regular rhythm, S1 normal and S2 normal.   No murmur heard. Pulses:      Dorsalis pedis pulses are 0 on the right side, and 1+ on the left side.       Posterior tibial pulses are 1+ on the right side, and 2+ on the left side.  Pulmonary/Chest: Effort normal and breath sounds normal. He has no wheezes. He has no rhonchi.  Abdominal: Soft. Bowel sounds are normal. There is no tenderness.  Psychiatric: Mood, memory, affect and judgment normal.  Nursing note and vitals  reviewed.    Assessment & Plan  1. Uncontrolled type 2 diabetes mellitus with complication, without long-term current use of insulin (HCC) Point-of-care hemoglobin A1c is 8.5%, glucose is 111 mg/dL.  this is poorly controlled diabetes. However, patient reportedly has fasting and postprandial blood glucose within reasonable range of 110-120 mg/dL. No change in pharmacotherapy for now. Start on Lyrica for peripheral neuropathy. - pregabalin (LYRICA) 50 MG capsule; Take 1 capsule (50 mg total) by mouth 3 (three) times daily.  Dispense: 270 capsule; Refill: 0 - POCT HgB A1C - POCT Glucose (CBG) - POCT UA - Microalbumin  2. Dyslipidemia Change to simvastatin 20 mg at bedtime to avoid drug interaction with diltiazem. - Lipid Profile - COMPLETE METABOLIC PANEL WITH GFR - rosuvastatin (CRESTOR) 20 MG tablet; Take 1 tablet (20 mg total) by mouth daily.  Dispense: 90 tablet; Refill: 0  3. Essential hypertension  - hydrALAZINE (APRESOLINE) 25 MG tablet; Take 1 tablet (25 mg total) by mouth 3 (three) times daily.  Dispense: 270 tablet; Refill: 0  Jamaiya Tunnell Asad A. [facility]  [facility] 04/12/2016 2:01 PM

## 2016-04-13 LAB — COMPLETE METABOLIC PANEL WITH GFR
ALBUMIN: 4.1 g/dL (ref 3.6–5.1)
ALK PHOS: 48 U/L (ref 40–115)
ALT: 32 U/L (ref 9–46)
AST: 20 U/L (ref 10–35)
BUN: 15 mg/dL (ref 7–25)
CALCIUM: 9.5 mg/dL (ref 8.6–10.3)
CHLORIDE: 103 mmol/L (ref 98–110)
CO2: 24 mmol/L (ref 20–31)
Creat: 1.55 mg/dL — ABNORMAL HIGH (ref 0.70–1.18)
GFR, EST NON AFRICAN AMERICAN: 44 mL/min — AB (ref >=60)
GFR, Est African American: 51 mL/min — ABNORMAL LOW (ref >=60)
GLUCOSE: 88 mg/dL (ref 65–99)
POTASSIUM: 4.3 mmol/L (ref 3.5–5.3)
SODIUM: 142 mmol/L (ref 135–146)
Total Bilirubin: 0.4 mg/dL (ref 0.2–1.2)
Total Protein: 7.3 g/dL (ref 6.1–8.1)

## 2016-04-13 LAB — LIPID PANEL
CHOL/HDL RATIO: 2.8 ratio (ref <=5.0)
Cholesterol: 127 mg/dL (ref 125–200)
HDL: 45 mg/dL (ref >=40)
LDL Cholesterol: 61 mg/dL (ref <130)
Triglycerides: 106 mg/dL (ref <150)
VLDL: 21 mg/dL (ref <30)

## 2016-05-17 ENCOUNTER — Other Ambulatory Visit: Payer: Self-pay | Admitting: Family Medicine

## 2016-05-17 ENCOUNTER — Ambulatory Visit (INDEPENDENT_AMBULATORY_CARE_PROVIDER_SITE_OTHER): Payer: Commercial Managed Care - HMO | Admitting: Family Medicine

## 2016-05-17 VITALS — BP 146/72 | HR 56 | Temp 96.5°F | Ht 68.5 in | Wt 207.0 lb

## 2016-05-17 DIAGNOSIS — Z1159 Encounter for screening for other viral diseases: Secondary | ICD-10-CM | POA: Diagnosis not present

## 2016-05-17 DIAGNOSIS — Z0000 Encounter for general adult medical examination without abnormal findings: Secondary | ICD-10-CM | POA: Diagnosis not present

## 2016-05-17 NOTE — Progress Notes (Signed)
Subjective:   [name] is a 70 y.o. male who presents for Medicare Annual/Subsequent preventive examination.  Review of Systems:  N/A  Cardiac Risk Factors include: advanced age (>2655men, 56>65 women);diabetes mellitus;dyslipidemia;hypertension;male gender     Objective:    Vitals: BP (!) 146/72 (BP Location: Right Arm)   Pulse (!) 56   Temp (!) 96.5 F (35.8 C) (Oral)   Ht 5' 8.5" (1.74 m)   Wt 207 lb (93.9 kg)   BMI 31.02 kg/m   Body mass index is 31.02 kg/m.  Tobacco History  Smoking Status  . Former Smoker  . Quit date: 04/28/1964  Smokeless Tobacco  . Never Used     Counseling given: Not Answered   Past Medical History:  Diagnosis Date  . Atrial fibrillation and flutter (HCC)   . CHF (congestive heart failure) (HCC)   . Diabetes mellitus with complication (HCC)   . Essential hypertension   . Gastric ulcer   . GI bleeding   . Myocardial infarction   . Ventricular tachycardia (HCC) 05/20/2014   Past Surgical History:  Procedure Laterality Date  . ELECTROPHYSIOLOGIC STUDY N/A 07/28/2015   Procedure: Cardioversion;  Surgeon: Lamar BlinksBruce J Kowalski, MD;  Location: [facility] ORS;  Service: Cardiovascular;  Laterality: N/A;  . HERNIA REPAIR    . LEFT HEART CATHETERIZATION WITH CORONARY ANGIOGRAM N/A 05/20/2014   Procedure: LEFT HEART CATHETERIZATION WITH CORONARY ANGIOGRAM;  Surgeon: Micheline ChapmanMichael D Cooper, MD;  [facility];  Service: Cardiovascular;  Laterality: N/A;   Family History  Problem Relation Age of Onset  . Congestive Heart Failure Mother    History  Sexual Activity  . Sexual activity: Yes    Outpatient Encounter Prescriptions as of 05/17/2016  Medication Sig  . glipiZIDE (GLUCOTROL) 10 MG tablet TAKE ONE TABLET BY MOUTH TWICE DAILY BEFORE  A  MEAL.  . hydrALAZINE (APRESOLINE) 25 MG tablet Take 1 tablet (25 mg total) by mouth 3 (three) times daily.  . metoprolol tartrate (LOPRESSOR) 25 MG tablet TAKE ONE TABLET BY MOUTH TWICE DAILY  .  rosuvastatin (CRESTOR) 20 MG tablet Take 1 tablet (20 mg total) by mouth daily.  . simvastatin (ZOCOR) 10 MG tablet Take 10 mg by mouth daily.  . valsartan-hydrochlorothiazide (DIOVAN-HCT) 320-25 MG tablet Take 1 tablet by mouth daily.  Marland Kitchen. warfarin (COUMADIN) 5 MG tablet Take 2.5-5 mg by mouth daily. Pt takes one tablet Monday-Friday and one-half tablet Saturday and Sunday.  Marland Kitchen. amiodarone (PACERONE) 400 MG tablet Take 1 tablet (400 mg total) by mouth daily. (Patient not taking: Reported on 05/17/2016)  . atorvastatin (LIPITOR) 20 MG tablet Take 1 tablet (20 mg total) by mouth daily at 6 PM. (Patient not taking: Reported on 05/17/2016)  . diltiazem (CARDIZEM CD) 240 MG 24 hr capsule Take 1 capsule (240 mg total) by mouth daily. (Patient not taking: Reported on 05/17/2016)  . nitroGLYCERIN (NITROSTAT) 0.4 MG SL tablet Place 0.4 mg under the tongue every 5 (five) minutes as needed.  Marland Kitchen. oxyCODONE-acetaminophen (ROXICET) 5-325 MG tablet Take 1 tablet by mouth every 4 (four) hours as needed for severe pain. (Patient not taking: Reported on 05/17/2016)  . pregabalin (LYRICA) 50 MG capsule Take 1 capsule (50 mg total) by mouth 3 (three) times daily. (Patient not taking: Reported on 05/17/2016)  . sitaGLIPtin (JANUVIA) 100 MG tablet Take 1 tablet (100 mg total) by mouth daily. (Patient not taking: Reported on 05/17/2016)   No facility-administered encounter medications on file as of 05/17/2016.  Activities of Daily Living In your present state of health, do you have any difficulty performing the following activities: 05/17/2016 04/12/2016  Hearing? N N  Vision? N Y  Difficulty concentrating or making decisions? N N  Walking or climbing stairs? N N  Dressing or bathing? N N  Doing errands, shopping? N N  Preparing Food and eating ? N -  Using the Toilet? N -  In the past six months, have you accidently leaked urine? N -  Do you have problems with loss of bowel control? N -  Managing your  Medications? N -  Managing your Finances? N -  Housekeeping or managing your Housekeeping? N -  Some recent data might be hidden    Patient Care Team: Ellyn HackSyed Asad A Shah, MD as PCP - General (Family Medicine) Lamar BlinksBruce J Kowalski, MD as Consulting Physician (Cardiology)   Assessment:     Exercise Activities and Dietary recommendations Current Exercise Habits: Home exercise routine, Type of exercise: walking, Time (Minutes): 25, Frequency (Times/Week): 7, Weekly Exercise (Minutes/Week): 175, Intensity: Mild  Goals    . Increase water intake          Starting 05/17/16, I will increase my water in take to 5-6 glasses a day.      Fall Risk Fall Risk  05/17/2016 04/12/2016 12/29/2015 09/26/2015 08/08/2015  Falls in the past year? No No No No No   Depression Screen PHQ 2/9 Scores 05/17/2016 04/12/2016 12/29/2015 09/26/2015  PHQ - 2 Score 0 0 0 0    Cognitive Function     6CIT Screen 05/17/2016  What Year? 0 points  What month? 0 points  What time? 0 points  Count back from 20 0 points  Months in reverse 4 points  Repeat phrase 2 points  Total Score 6     There is no immunization history on file for this patient. Screening Tests Health Maintenance  Topic Date Due  . OPHTHALMOLOGY EXAM  07/29/2016 (Originally 07/05/1955)  . INFLUENZA VACCINE  05/17/2017 (Originally 01/27/2016)  . TETANUS/TDAP  05/17/2017 (Originally 07/06/1963)  . PNA vac Low Risk Adult (1 of 2 - PCV13) 05/17/2017 (Originally 07/05/2009)  . ZOSTAVAX  05/17/2026 (Originally 07/05/2004)  . HEMOGLOBIN A1C  10/11/2016  . FOOT EXAM  04/12/2017  . COLONOSCOPY  07/03/2020  . Hepatitis C Screening  Completed      Plan:  I have personally reviewed and addressed the Medicare Annual Wellness questionnaire and have noted the following in the patient's chart:  A. Medical and social history B. Use of alcohol, tobacco or illicit drugs  C. Current medications and supplements D. Functional ability and status E.  Nutritional  status F.  Physical activity G. Advance directives H. List of other physicians I.  Hospitalizations, surgeries, and ER visits in previous 12 months J.  Vitals K. Screenings such as hearing and vision if needed, cognitive and depression L. Referrals and appointments - none  In addition, I have reviewed and discussed with patient certain preventive protocols, quality metrics, and best practice recommendations. A written personalized care plan for preventive services as well as general preventive health recommendations were provided to patient.  See attached scanned questionnaire for additional information.   Signed,  Hyacinth MeekerMckenzie Ann Bohne, LPN Nurse Health Advisor I, as supervising physician, have reviewed the nurse health advisor's Medicare Wellness Visit note for this patient and concur with the findings and recommendations listed above.  Signed Syed Asad A. Sherryll BurgerShah MD Attending Physician.

## 2016-05-17 NOTE — Patient Instructions (Signed)
[name] , Thank you for taking time to come for your Medicare Wellness Visit. I appreciate your ongoing commitment to your health goals. Please review the following plan we discussed and let me know if I can assist you in the future.   These are the goals we discussed: Goals    . Increase water intake          Starting 05/17/16, I will increase my water in take to 5-6 glasses a day.       This is a list of the screening recommended for you and due dates:  Health Maintenance  Topic Date Due  .  Hepatitis C: One time screening is recommended by Center for Disease Control  (CDC) for  adults born from [birth year] through 1966.   09/08/1945  . Eye exam for diabetics  07/29/2016*  . Flu Shot  05/17/2017*  . Tetanus Vaccine  05/17/2017*  . Pneumonia vaccines (1 of 2 - PCV13) 05/17/2017*  . Shingles Vaccine  05/17/2026*  . Hemoglobin A1C  10/11/2016  . Complete foot exam   04/12/2017  . Colon Cancer Screening  07/03/2020  *Topic was postponed. The date shown is not the original due date.   Preventive Care for Adults  A healthy lifestyle and preventive care can promote health and wellness. Preventive health guidelines for adults include the following key practices.  . A routine yearly physical is a good way to check with your health care provider about your health and preventive screening. It is a chance to share any concerns and updates on your health and to receive a thorough exam.  . Visit your dentist for a routine exam and preventive care every 6 months. Brush your teeth twice a day and floss once a day. Good oral hygiene prevents tooth decay and gum disease.  . The frequency of eye exams is based on your age, health, family medical history, use  of contact lenses, and other factors. Follow your health care provider's ecommendations for frequency of eye exams.  . Eat a healthy diet. Foods like vegetables, fruits, whole grains, low-fat dairy products, and lean protein foods contain the  nutrients you need without too many calories. Decrease your intake of foods high in solid fats, added sugars, and salt. Eat the right amount of calories for you. Get information about a proper diet from your health care provider, if necessary.  . Regular physical exercise is one of the most important things you can do for your health. Most adults should get at least 150 minutes of moderate-intensity exercise (any activity that increases your heart rate and causes you to sweat) each week. In addition, most adults need muscle-strengthening exercises on 2 or more days a week.  Silver Sneakers may be a benefit available to you. To determine eligibility, you may visit the website: www.silversneakers.com or contact program at 208 576 [phone] Mon-Fri between 8AM-8PM.   . Maintain a healthy weight. The body mass index (BMI) is a screening tool to identify possible weight problems. It provides an estimate of body fat based on height and weight. Your health care provider can find your BMI and can help you achieve or maintain a healthy weight.   For adults 20 years and older: ? A BMI below 18.5 is considered underweight. ? A BMI of 18.5 to 24.9 is normal. ? A BMI of 25 to 29.9 is considered overweight. ? A BMI of 30 and above is considered obese.   . Maintain normal blood lipids and cholesterol levels by  exercising and minimizing your intake of saturated fat. Eat a balanced diet with plenty of fruit and vegetables. Blood tests for lipids and cholesterol should begin at age 80 and be repeated every 5 years. If your lipid or cholesterol levels are high, you are over 50, or you are at high risk for heart disease, you may need your cholesterol levels checked more frequently. Ongoing high lipid and cholesterol levels should be treated with medicines if diet and exercise are not working.  . If you smoke, find out from your health care provider how to quit. If you do not use tobacco, please do not start.  . If you  choose to drink alcohol, please do not consume more than 2 drinks per day. One drink is considered to be 12 ounces (355 mL) of beer, 5 ounces (148 mL) of wine, or 1.5 ounces (44 mL) of liquor.  . If you are 79-49 years old, ask your health care provider if you should take aspirin to prevent strokes.  . Use sunscreen. Apply sunscreen liberally and repeatedly throughout the day. You should seek shade when your shadow is shorter than you. Protect yourself by wearing long sleeves, pants, a wide-brimmed hat, and sunglasses year round, whenever you are outdoors.  . Once a month, do a whole body skin exam, using a mirror to look at the skin on your back. Tell your health care provider of new moles, moles that have irregular borders, moles that are larger than a pencil eraser, or moles that have changed in shape or color.

## 2016-05-18 LAB — HEPATITIS C ANTIBODY (REFLEX): HCV Ab: <0.1 s/co ratio (ref 0.0–0.9)

## 2016-05-18 LAB — HCV COMMENT:

## 2016-05-26 ENCOUNTER — Encounter: Payer: Self-pay | Admitting: Family Medicine

## 2016-07-10 ENCOUNTER — Other Ambulatory Visit: Payer: Self-pay | Admitting: Family Medicine

## 2016-07-10 DIAGNOSIS — E785 Hyperlipidemia, unspecified: Secondary | ICD-10-CM

## 2016-08-16 ENCOUNTER — Other Ambulatory Visit: Payer: Self-pay | Admitting: Family Medicine

## 2016-08-16 DIAGNOSIS — I10 Essential (primary) hypertension: Secondary | ICD-10-CM

## 2016-08-26 ENCOUNTER — Ambulatory Visit: Payer: Commercial Managed Care - HMO | Admitting: Family Medicine

## 2016-08-30 ENCOUNTER — Encounter: Payer: Self-pay | Admitting: Family Medicine

## 2016-08-30 ENCOUNTER — Ambulatory Visit (INDEPENDENT_AMBULATORY_CARE_PROVIDER_SITE_OTHER): Payer: Commercial Managed Care - HMO | Admitting: Family Medicine

## 2016-08-30 VITALS — BP 130/84 | HR 72 | Temp 97.6°F | Resp 16 | Ht 69.0 in | Wt 205.6 lb

## 2016-08-30 DIAGNOSIS — I10 Essential (primary) hypertension: Secondary | ICD-10-CM | POA: Diagnosis not present

## 2016-08-30 DIAGNOSIS — E1142 Type 2 diabetes mellitus with diabetic polyneuropathy: Secondary | ICD-10-CM

## 2016-08-30 DIAGNOSIS — IMO0002 Reserved for concepts with insufficient information to code with codable children: Secondary | ICD-10-CM

## 2016-08-30 DIAGNOSIS — E1165 Type 2 diabetes mellitus with hyperglycemia: Secondary | ICD-10-CM | POA: Diagnosis not present

## 2016-08-30 DIAGNOSIS — E785 Hyperlipidemia, unspecified: Secondary | ICD-10-CM | POA: Diagnosis not present

## 2016-08-30 LAB — COMPLETE METABOLIC PANEL WITH GFR
ALBUMIN: 4 g/dL (ref 3.6–5.1)
ALT: 28 U/L (ref 9–46)
AST: 19 U/L (ref 10–35)
Alkaline Phosphatase: 46 U/L (ref 40–115)
BUN: 19 mg/dL (ref 7–25)
CHLORIDE: 102 mmol/L (ref 98–110)
CO2: 29 mmol/L (ref 20–31)
Calcium: 9.8 mg/dL (ref 8.6–10.3)
Creat: 1.41 mg/dL — ABNORMAL HIGH (ref 0.70–1.18)
GFR, Est African American: 57 mL/min — ABNORMAL LOW (ref >=60)
GFR, Est Non African American: 49 mL/min — ABNORMAL LOW (ref >=60)
GLUCOSE: 174 mg/dL — AB (ref 65–99)
POTASSIUM: 4.4 mmol/L (ref 3.5–5.3)
SODIUM: 140 mmol/L (ref 135–146)
Total Bilirubin: 0.5 mg/dL (ref 0.2–1.2)
Total Protein: 6.9 g/dL (ref 6.1–8.1)

## 2016-08-30 LAB — LIPID PANEL
CHOL/HDL RATIO: 3 ratio (ref <5.0)
Cholesterol: 139 mg/dL (ref <200)
HDL: 47 mg/dL (ref >40)
LDL CALC: 71 mg/dL (ref <100)
Triglycerides: 103 mg/dL (ref <150)
VLDL: 21 mg/dL (ref <30)

## 2016-08-30 LAB — POCT GLYCOSYLATED HEMOGLOBIN (HGB A1C): HEMOGLOBIN A1C: 10.4

## 2016-08-30 LAB — GLUCOSE, POCT (MANUAL RESULT ENTRY): POC Glucose: 179 mg/dl — AB (ref 70–99)

## 2016-08-30 MED ORDER — DULAGLUTIDE 0.75 MG/0.5ML ~~LOC~~ SOAJ: SUBCUTANEOUS | 2 refills | Status: DC

## 2016-08-30 MED ORDER — HYDRALAZINE HCL 25 MG PO TABS: 25.0000 mg | ORAL_TABLET | Freq: Three times a day (TID) | ORAL | 0 refills | Status: DC

## 2016-08-30 MED ORDER — GLIPIZIDE 10 MG PO TABS: ORAL_TABLET | ORAL | 0 refills | Status: DC

## 2016-08-30 NOTE — Progress Notes (Signed)
Name: Jay ClementWilliam H [name]   MRN: [id number]    DOB: 08/15/1945   Date:08/30/2016       Progress Note  Subjective  Chief Complaint  Chief Complaint  Patient presents with  . Diabetes  . Medication Refill    Diabetes  He presents for his follow-up diabetic visit. He has type 2 diabetes mellitus. His disease course has been worsening. There are no hypoglycemic associated symptoms. Pertinent negatives for hypoglycemia include no headaches. Associated symptoms include foot paresthesias (numbness at the bottom of right foot). Pertinent negatives for diabetes include no blurred vision, no chest pain, no polydipsia and no polyuria. Diabetic complications include a CVA, heart disease and peripheral neuropathy. Current diabetic treatment includes oral agent (dual therapy). His weight is stable. He is following a diabetic and generally healthy diet. He monitors blood glucose at home 3-4 x per week. His breakfast blood glucose range is generally 110-130 mg/dl. An ACE inhibitor/angiotensin II receptor blocker is being taken.  Hypertension  This is a chronic problem. The problem is unchanged. The problem is controlled. Pertinent negatives include no blurred vision, chest pain, headaches, palpitations or shortness of breath. Past treatments include angiotensin blockers, diuretics, beta blockers, calcium channel blockers and direct vasodilators. Hypertensive end-organ damage includes kidney disease, CAD/MI and CVA.  Hyperlipidemia  This is a chronic problem. The problem is controlled. Recent lipid tests were reviewed and are normal. Pertinent negatives include no chest pain or shortness of breath. Current antihyperlipidemic treatment includes statins.     Past Medical History:  Diagnosis Date  . Atrial fibrillation and flutter (HCC)   . CHF (congestive heart failure) (HCC)   . Diabetes mellitus with complication (HCC)   . Essential hypertension   . Gastric ulcer   . GI bleeding   . Myocardial infarction    . Ventricular tachycardia (HCC) 05/20/2014    Past Surgical History:  Procedure Laterality Date  . ELECTROPHYSIOLOGIC STUDY N/A 07/28/2015   Procedure: Cardioversion;  Surgeon: Lamar BlinksBruce J Kowalski, MD;  Location: [facility] ORS;  Service: Cardiovascular;  Laterality: N/A;  . HERNIA REPAIR    . LEFT HEART CATHETERIZATION WITH CORONARY ANGIOGRAM N/A 05/20/2014   Procedure: LEFT HEART CATHETERIZATION WITH CORONARY ANGIOGRAM;  Surgeon: Micheline ChapmanMichael D Cooper, MD;  [facility];  Service: Cardiovascular;  Laterality: N/A;    Family History  Problem Relation Age of Onset  . Congestive Heart Failure Mother     Social History   Social History  . Marital status: Married    Spouse name: N/A  . Number of children: N/A  . Years of education: N/A   Occupational History  . Not on file.   Social History Main Topics  . Smoking status: Former Smoker    Quit date: 04/28/1964  . Smokeless tobacco: Never Used  . Alcohol use No  . Drug use: No  . Sexual activity: Yes   Other Topics Concern  . Not on file   Social History Narrative   Married, lives in [place]. Retired from [employer] work. Nonsmoker, no Etoh.     Current Outpatient Prescriptions:  .  amiodarone (PACERONE) 400 MG tablet, Take 1 tablet (400 mg total) by mouth daily., Disp: 30 tablet, Rfl: 6 .  atorvastatin (LIPITOR) 20 MG tablet, Take 1 tablet (20 mg total) by mouth daily at 6 PM., Disp: 90 tablet, Rfl: 1 .  diltiazem (CARDIZEM CD) 240 MG 24 hr capsule, Take 1 capsule (240 mg total) by mouth daily., Disp: 30 capsule, Rfl: 0 .  glipiZIDE (  GLUCOTROL) 10 MG tablet, TAKE ONE TABLET BY MOUTH TWICE DAILY BEFORE  A  MEAL., Disp: 180 tablet, Rfl: 0 .  hydrALAZINE (APRESOLINE) 25 MG tablet, Take 1 tablet (25 mg total) by mouth 3 (three) times daily., Disp: 270 tablet, Rfl: 0 .  metoprolol tartrate (LOPRESSOR) 25 MG tablet, TAKE ONE TABLET BY MOUTH TWICE DAILY, Disp: 180 tablet, Rfl: 0 .  nitroGLYCERIN (NITROSTAT) 0.4 MG SL tablet, Place 0.4  mg under the tongue every 5 (five) minutes as needed., Disp: , Rfl:  .  oxyCODONE-acetaminophen (ROXICET) 5-325 MG tablet, Take 1 tablet by mouth every 4 (four) hours as needed for severe pain., Disp: 10 tablet, Rfl: 0 .  pregabalin (LYRICA) 50 MG capsule, Take 1 capsule (50 mg total) by mouth 3 (three) times daily., Disp: 270 capsule, Rfl: 0 .  rosuvastatin (CRESTOR) 20 MG tablet, TAKE ONE TABLET BY MOUTH ONCE DAILY, Disp: 90 tablet, Rfl: 0 .  simvastatin (ZOCOR) 10 MG tablet, Take 10 mg by mouth daily., Disp: , Rfl:  .  sitaGLIPtin (JANUVIA) 100 MG tablet, Take 1 tablet (100 mg total) by mouth daily., Disp: 90 tablet, Rfl: 0 .  valsartan-hydrochlorothiazide (DIOVAN-HCT) 320-25 MG tablet, Take 1 tablet by mouth daily., Disp: , Rfl:  .  warfarin (COUMADIN) 5 MG tablet, Take 2.5-5 mg by mouth daily. Pt takes one tablet Monday-Friday and one-half tablet Saturday and Sunday., Disp: , Rfl:   No Known Allergies   Review of Systems  Eyes: Negative for blurred vision.  Respiratory: Negative for shortness of breath.   Cardiovascular: Negative for chest pain and palpitations.  Neurological: Negative for headaches.  Endo/Heme/Allergies: Negative for polydipsia.    Objective  Vitals:   08/30/16 1022  BP: 130/84  Pulse: 72  Resp: 16  Temp: 97.6 F (36.4 C)  TempSrc: Oral  SpO2: 99%  Weight: 205 lb 9.6 oz (93.3 kg)  Height: 5\' 9"  (1.753 m)    Physical Exam  Constitutional: He is oriented to person, place, and time and well-developed, well-nourished, and in no distress.  HENT:  Head: Normocephalic and atraumatic.  Cardiovascular: Normal rate, regular rhythm and normal heart sounds.   No murmur heard. Pulmonary/Chest: Effort normal and breath sounds normal. He has no wheezes.  Abdominal: Soft. Bowel sounds are normal. There is no tenderness.  Musculoskeletal: He exhibits no edema.  Neurological: He is alert and oriented to person, place, and time.  Psychiatric: Mood, memory, affect and  judgment normal.  Nursing note and vitals reviewed.   Recent Results (from the past 2160 hour(s))  POCT HgB A1C     Status: None   Collection Time: 08/30/16 10:32 AM  Result Value Ref Range   Hemoglobin A1C 10.4   POCT Glucose (CBG)     Status: Abnormal   Collection Time: 08/30/16 10:32 AM  Result Value Ref Range   POC Glucose 179 (A) 70 - 99 mg/dl     Assessment & Plan  1. Uncontrolled type 2 diabetes mellitus with peripheral neuropathy (HCC) Point-of-care A1c is 10.4%, poorly controlled diabetes. We'll start on Trulicity, DC Januvia, advised on dietary and lifestyle changes. - POCT HgB A1C - POCT Glucose (CBG) - glipiZIDE (GLUCOTROL) 10 MG tablet; TAKE ONE TABLET BY MOUTH TWICE DAILY BEFORE  A  MEAL.  Dispense: 180 tablet; Refill: 0 - Dulaglutide (TRULICITY) 0.75 MG/0.5ML SOPN; Start 0.75 mg [place] q week x 2 weeks, then increase to 1.5 mg [place] q week.  Dispense: 5 pen; Refill: 2  2. Essential hypertension BP stable  on present antihypertensive therapy - hydrALAZINE (APRESOLINE) 25 MG tablet; Take 1 tablet (25 mg total) by mouth 3 (three) times daily.  Dispense: 270 tablet; Refill: 0  3. Dyslipidemia  - COMPLETE METABOLIC PANEL WITH GFR - Lipid panel   Mahaley Schwering Asad A[facility] 08/30/2016 10:41 AM

## 2016-09-22 ENCOUNTER — Encounter: Payer: Self-pay | Admitting: Emergency Medicine

## 2016-09-22 ENCOUNTER — Emergency Department
Admission: EM | Admit: 2016-09-22 | Discharge: 2016-09-22 | Disposition: A | Discharge status: Home / Self Care | Payer: Medicare HMO | Attending: Student in an Organized Health Care Education/Training Program | Admitting: Student in an Organized Health Care Education/Training Program

## 2016-09-22 ENCOUNTER — Emergency Department: Payer: Medicare HMO

## 2016-09-22 DIAGNOSIS — Z87891 Personal history of nicotine dependence: Secondary | ICD-10-CM | POA: Diagnosis not present

## 2016-09-22 DIAGNOSIS — J029 Acute pharyngitis, unspecified: Secondary | ICD-10-CM | POA: Diagnosis present

## 2016-09-22 DIAGNOSIS — Z79899 Other long term (current) drug therapy: Secondary | ICD-10-CM | POA: Insufficient documentation

## 2016-09-22 DIAGNOSIS — I509 Heart failure, unspecified: Secondary | ICD-10-CM | POA: Diagnosis not present

## 2016-09-22 DIAGNOSIS — N183 Chronic kidney disease, stage 3 (moderate): Secondary | ICD-10-CM | POA: Diagnosis not present

## 2016-09-22 DIAGNOSIS — Z7984 Long term (current) use of oral hypoglycemic drugs: Secondary | ICD-10-CM | POA: Insufficient documentation

## 2016-09-22 DIAGNOSIS — J069 Acute upper respiratory infection, unspecified: Secondary | ICD-10-CM | POA: Diagnosis not present

## 2016-09-22 DIAGNOSIS — I130 Hypertensive heart and chronic kidney disease with heart failure and stage 1 through stage 4 chronic kidney disease, or unspecified chronic kidney disease: Secondary | ICD-10-CM | POA: Diagnosis not present

## 2016-09-22 DIAGNOSIS — Z7901 Long term (current) use of anticoagulants: Secondary | ICD-10-CM | POA: Insufficient documentation

## 2016-09-22 DIAGNOSIS — I252 Old myocardial infarction: Secondary | ICD-10-CM | POA: Insufficient documentation

## 2016-09-22 DIAGNOSIS — E1122 Type 2 diabetes mellitus with diabetic chronic kidney disease: Secondary | ICD-10-CM | POA: Diagnosis not present

## 2016-09-22 DIAGNOSIS — R05 Cough: Secondary | ICD-10-CM | POA: Diagnosis not present

## 2016-09-22 MED ORDER — PSEUDOEPH-BROMPHEN-DM 30-2-10 MG/5ML PO SYRP: 10.0000 mL | ORAL_SOLUTION | Freq: Four times a day (QID) | ORAL | 0 refills | Status: DC | PRN

## 2016-09-22 MED ORDER — CETIRIZINE HCL 10 MG PO TABS: 10.0000 mg | ORAL_TABLET | Freq: Every day | ORAL | 0 refills | Status: DC

## 2016-09-22 MED ORDER — FLUTICASONE PROPIONATE 50 MCG/ACT NA SUSP: 1.0000 | Freq: Two times a day (BID) | NASAL | 0 refills | Status: DC

## 2016-09-22 NOTE — ED Notes (Signed)
Strep negative, throat culture sent

## 2016-09-22 NOTE — ED Provider Notes (Signed)
[facility] Emergency Department Provider Note  ____________________________________________  Time seen: Approximately 10:06 PM  I have reviewed the triage vital signs and the nursing notes.   HISTORY  Chief Complaint Sore Throat and Nasal Congestion    HPI [name] is a 71 y.o. male who presents emergency department complaining of sore throat, productive cough. Patient has also had some sinus congestion as well as fevers and chills. Patient reports his symptoms have been ongoing 3 days. He denies any headache, neck pain, chest pain, shortness of breath, abdominal pain, nausea or vomiting. Patient is tried Tylenol and Motrin which has helped somewhat with symptoms. Patient has had multiple sick contacts at home with similar symptoms. No other complaints at this time.   Past Medical History:  Diagnosis Date  . Atrial fibrillation and flutter (HCC)   . CHF (congestive heart failure) (HCC)   . Diabetes mellitus with complication (HCC)   . Essential hypertension   . Gastric ulcer   . GI bleeding   . Myocardial infarction   . Ventricular tachycardia (HCC) 05/20/2014    Patient Active Problem List   Diagnosis Date Noted  . Left knee pain 08/08/2015  . Pain and swelling of left knee 07/16/2015  . Atrial fibrillation (HCC) 07/16/2015  . Dyspnea 04/17/2015  . Atypical atrial flutter (HCC) 04/17/2015  . Acute renal insufficiency 04/17/2015  . Sinus bradycardia 04/17/2015  . Atrial flutter (HCC) 04/16/2015  . A-fib (HCC) 02/05/2015  . Essential hypertension 05/21/2014  . CKD (chronic kidney disease) stage 3, GFR 30-59 ml/min 05/21/2014  . Dyslipidemia 05/21/2014  . 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 Anemia 05/21/2014  . Ventricular tachycardia (HCC) 05/20/2014  . NSTEMI (non-ST elevated myocardial infarction) (HCC) 05/20/2014    Past Surgical History:  Procedure Laterality Date  .  ELECTROPHYSIOLOGIC STUDY N/A 07/28/2015   Procedure: Cardioversion;  Surgeon: Lamar BlinksBruce J Kowalski, MD;  Location: [facility] ORS;  Service: Cardiovascular;  Laterality: N/A;  . HERNIA REPAIR    . LEFT HEART CATHETERIZATION WITH CORONARY ANGIOGRAM N/A 05/20/2014   Procedure: LEFT HEART CATHETERIZATION WITH CORONARY ANGIOGRAM;  Surgeon: Micheline ChapmanMichael D Cooper, MD;  [facility];  Service: Cardiovascular;  Laterality: N/A;    Prior to Admission medications   Medication Sig Start Date End Date Taking? Authorizing Provider  amiodarone (PACERONE) 400 MG tablet Take 1 tablet (400 mg total) by mouth daily. 04/17/15   Katharina Caperima Vaickute, MD  brompheniramine-pseudoephedrine-DM 30-2-10 MG/5ML syrup Take 10 mLs by mouth 4 (four) times daily as needed. 09/22/16   Delorise RoyalsJonathan D Uzziah Rigg, PA-C  cetirizine (ZYRTEC) 10 MG tablet Take 1 tablet (10 mg total) by mouth daily. 09/22/16   Delorise RoyalsJonathan D Addalee Kavanagh, PA-C  diltiazem (CARDIZEM CD) 240 MG 24 hr capsule Take 1 capsule (240 mg total) by mouth daily. 07/21/15   Sital Mody, MD  Dulaglutide (TRULICITY) 0.75 MG/0.5ML SOPN Start 0.75 mg [place] q week x 2 weeks, then increase to 1.5 mg [place] q week. 08/30/16   Ellyn HackSyed Asad A Shah, MD  fluticasone (FLONASE) 50 MCG/ACT nasal spray Place 1 spray into both nostrils 2 (two) times daily. 09/22/16   Delorise RoyalsJonathan D Teagan Heidrick, PA-C  glipiZIDE (GLUCOTROL) 10 MG tablet TAKE ONE TABLET BY MOUTH TWICE DAILY BEFORE  A  MEAL. 08/30/16   Ellyn HackSyed Asad A Shah, MD  hydrALAZINE (APRESOLINE) 25 MG tablet Take 1 tablet (25 mg total) by mouth 3 (three) times daily. 08/30/16   Ellyn HackSyed Asad A Shah, MD  metoprolol tartrate (  LOPRESSOR) 25 MG tablet TAKE ONE TABLET BY MOUTH TWICE DAILY 10/31/15   Ellyn Hack, MD  nitroGLYCERIN (NITROSTAT) 0.4 MG SL tablet Place 0.4 mg under the tongue every 5 (five) minutes as needed.    Historical Provider, MD  pregabalin (LYRICA) 50 MG capsule Take 1 capsule (50 mg total) by mouth 3 (three) times daily. 04/12/16   Ellyn Hack, MD  rosuvastatin  (CRESTOR) 20 MG tablet TAKE ONE TABLET BY MOUTH ONCE DAILY 07/13/16   Ellyn Hack, MD  valsartan-hydrochlorothiazide (DIOVAN-HCT) 320-25 MG tablet Take 1 tablet by mouth daily.    Historical Provider, MD  warfarin (COUMADIN) 5 MG tablet Take 2.5-5 mg by mouth daily. Pt takes one tablet Monday-Friday and one-half tablet Saturday and Sunday.    Historical Provider, MD    Allergies Patient has no known allergies.  Family History  Problem Relation Age of Onset  . Congestive Heart Failure Mother     Social History Social History  Substance Use Topics  . Smoking status: Former Smoker    Quit date: 04/28/1964  . Smokeless tobacco: Never Used  . Alcohol use No     Review of Systems  Constitutional: No fever/chills Eyes: No visual changes. No discharge ENT: Positive for nasal congestion and sore throat. Cardiovascular: no chest pain. Respiratory: Positive cough. No SOB. Gastrointestinal: No abdominal pain.  No nausea, no vomiting.   Musculoskeletal: Negative for musculoskeletal pain. Skin: Negative for rash, abrasions, lacerations, ecchymosis. Neurological: Negative for headaches, focal weakness or numbness. 10-point ROS otherwise negative.  ____________________________________________   PHYSICAL EXAM:  VITAL SIGNS: ED Triage Vitals  Enc Vitals Group     BP 09/22/16 1947 (!) 104/51     Pulse Rate 09/22/16 1947 (!) 52     Resp 09/22/16 1947 20     Temp 09/22/16 1947 99.6 F (37.6 C)     Temp Source 09/22/16 1947 Oral     SpO2 09/22/16 1947 98 %     Weight 09/22/16 1947 200 lb (90.7 kg)     Height 09/22/16 1947 5\' 11"  (1.803 m)     Head Circumference --      Peak Flow --      Pain Score 09/22/16 1946 8     Pain Loc --      Pain Edu? --      Excl. in GC? --      Constitutional: Alert and oriented. Well appearing and in no acute distress. Eyes: Conjunctivae are normal. PERRL. EOMI. Head: Atraumatic. ENT:      Ears: EACs and TMs are unremarkable bilaterally.       Nose: Moderate congestion/rhinnorhea.      Mouth/Throat: Mucous membranes are moist. Oropharynx is moderately erythematous but nonedematous. Neck: No stridor. Neck is supple with full range of motion Hematological/Lymphatic/Immunilogical: No cervical lymphadenopathy. Cardiovascular: Normal rate, regular rhythm. Normal S1 and S2.  Good peripheral circulation. Respiratory: Normal respiratory effort without tachypnea or retractions. Lungs with scattered crackles to left lung field. No rales or rhonchi. No wheezing.Peri [place] air entry to the bases with no decreased or absent breath sounds. Musculoskeletal: Full range of motion to all extremities. No gross deformities appreciated. Neurologic:  Normal speech and language. No gross focal neurologic deficits are appreciated.  Skin:  Skin is warm, dry and intact. No rash noted. Psychiatric: Mood and affect are normal. Speech and behavior are normal. Patient exhibits appropriate insight and judgement.   ____________________________________________   LABS (all labs ordered are listed,  but only abnormal results are displayed)  Labs Reviewed  CULTURE, GROUP A STREP [facility])  POCT RAPID STREP A   ____________________________________________  EKG   ____________________________________________  RADIOLOGY Festus Barren Koleen Celia, personally viewed and evaluated these images (plain radiographs) as part of my medical decision making, as well as reviewing the written report by the radiologist.  Dg Chest 2 View  Result Date: 09/22/2016 CLINICAL DATA:  Acute onset of sore throat and nonproductive cough. Sinus congestion and fever. Initial encounter. EXAM: CHEST  2 VIEW COMPARISON:  Chest radiograph performed 08/08/2015 FINDINGS: The lungs are well-aerated. Mild vascular congestion is noted. There is no evidence of focal opacification, pleural effusion or pneumothorax. The heart is mildly enlarged. No acute osseous abnormalities are seen. IMPRESSION: Mild  vascular congestion and mild cardiomegaly. Lungs remain grossly clear. Electronically Signed   By: Roanna Raider M.D.   On: 09/22/2016 21:58    ____________________________________________    PROCEDURES  Procedure(s) performed:    Procedures    Medications - No data to display   ____________________________________________   INITIAL IMPRESSION / ASSESSMENT AND PLAN / ED COURSE  Pertinent labs & imaging results that were available during my care of the patient were reviewed by me and considered in my medical decision making (see chart for details).  Review of the [place] CSRS was performed in accordance of the [facility] prior to dispensing any controlled drugs.     Patient's diagnosis is consistent with Viral respiratory infection. Patient had a negative strep and chest x-ray was unremarkable for pulmonary abnormality.. Patient will be discharged home with prescriptions for symptom control medication. Patient is to follow up with primary care as needed or otherwise directed. Patient is given ED precautions to return to the ED for any worsening or new symptoms.     ____________________________________________  FINAL CLINICAL IMPRESSION(S) / ED DIAGNOSES  Final diagnoses:  Viral upper respiratory tract infection      NEW MEDICATIONS STARTED DURING THIS VISIT:  New Prescriptions   BROMPHENIRAMINE-PSEUDOEPHEDRINE-DM 30-2-10 MG/5ML SYRUP    Take 10 mLs by mouth 4 (four) times daily as needed.   CETIRIZINE (ZYRTEC) 10 MG TABLET    Take 1 tablet (10 mg total) by mouth daily.   FLUTICASONE (FLONASE) 50 MCG/ACT NASAL SPRAY    Place 1 spray into both nostrils 2 (two) times daily.        This chart was dictated using voice recognition software/Dragon. Despite best efforts to proofread, errors can occur which can change the meaning. Any change was purely unintentional.    Racheal Patches, PA-C 09/22/16 2214    Willy Eddy, MD 09/22/16 2216

## 2016-09-22 NOTE — ED Triage Notes (Addendum)
Pt to triage via w/c, mask in place, with no distress noted; pt reports x 3 days having sore throat, nonprod cough, sinus congestion, fever; denies any other c/o of pain except throat

## 2016-09-23 LAB — POCT RAPID STREP A: STREPTOCOCCUS, GROUP A SCREEN (DIRECT): NEGATIVE

## 2016-09-25 LAB — CULTURE, GROUP A STREP (THRC)

## 2016-09-26 ENCOUNTER — Inpatient Hospital Stay
Admission: EM | Admit: 2016-09-26 | Discharge: 2016-09-28 | DRG: 194 | Disposition: A | Discharge status: Home / Self Care | Payer: Medicare HMO | Attending: Internal Medicine | Admitting: Internal Medicine

## 2016-09-26 ENCOUNTER — Emergency Department: Payer: Medicare HMO

## 2016-09-26 ENCOUNTER — Encounter: Payer: Self-pay | Admitting: Emergency Medicine

## 2016-09-26 DIAGNOSIS — R791 Abnormal coagulation profile: Secondary | ICD-10-CM | POA: Diagnosis not present

## 2016-09-26 DIAGNOSIS — Z7901 Long term (current) use of anticoagulants: Secondary | ICD-10-CM

## 2016-09-26 DIAGNOSIS — Z8249 Family history of ischemic heart disease and other diseases of the circulatory system: Secondary | ICD-10-CM | POA: Diagnosis not present

## 2016-09-26 DIAGNOSIS — Z87891 Personal history of nicotine dependence: Secondary | ICD-10-CM

## 2016-09-26 DIAGNOSIS — N189 Chronic kidney disease, unspecified: Secondary | ICD-10-CM

## 2016-09-26 DIAGNOSIS — I129 Hypertensive chronic kidney disease with stage 1 through stage 4 chronic kidney disease, or unspecified chronic kidney disease: Secondary | ICD-10-CM | POA: Diagnosis not present

## 2016-09-26 DIAGNOSIS — I10 Essential (primary) hypertension: Secondary | ICD-10-CM | POA: Diagnosis not present

## 2016-09-26 DIAGNOSIS — Z7984 Long term (current) use of oral hypoglycemic drugs: Secondary | ICD-10-CM

## 2016-09-26 DIAGNOSIS — N183 Chronic kidney disease, stage 3 (moderate): Secondary | ICD-10-CM | POA: Diagnosis present

## 2016-09-26 DIAGNOSIS — N179 Acute kidney failure, unspecified: Secondary | ICD-10-CM | POA: Diagnosis not present

## 2016-09-26 DIAGNOSIS — Z79899 Other long term (current) drug therapy: Secondary | ICD-10-CM | POA: Diagnosis not present

## 2016-09-26 DIAGNOSIS — I252 Old myocardial infarction: Secondary | ICD-10-CM | POA: Diagnosis not present

## 2016-09-26 DIAGNOSIS — R310 Gross hematuria: Secondary | ICD-10-CM

## 2016-09-26 DIAGNOSIS — I482 Chronic atrial fibrillation: Secondary | ICD-10-CM | POA: Diagnosis present

## 2016-09-26 DIAGNOSIS — K920 Hematemesis: Secondary | ICD-10-CM | POA: Diagnosis not present

## 2016-09-26 DIAGNOSIS — J101 Influenza due to other identified influenza virus with other respiratory manifestations: Secondary | ICD-10-CM | POA: Diagnosis not present

## 2016-09-26 DIAGNOSIS — R042 Hemoptysis: Secondary | ICD-10-CM | POA: Diagnosis not present

## 2016-09-26 DIAGNOSIS — E1122 Type 2 diabetes mellitus with diabetic chronic kidney disease: Secondary | ICD-10-CM | POA: Diagnosis present

## 2016-09-26 DIAGNOSIS — I2510 Atherosclerotic heart disease of native coronary artery without angina pectoris: Secondary | ICD-10-CM | POA: Diagnosis present

## 2016-09-26 DIAGNOSIS — E860 Dehydration: Secondary | ICD-10-CM | POA: Diagnosis not present

## 2016-09-26 DIAGNOSIS — I509 Heart failure, unspecified: Secondary | ICD-10-CM | POA: Diagnosis not present

## 2016-09-26 DIAGNOSIS — I130 Hypertensive heart and chronic kidney disease with heart failure and stage 1 through stage 4 chronic kidney disease, or unspecified chronic kidney disease: Secondary | ICD-10-CM | POA: Diagnosis present

## 2016-09-26 DIAGNOSIS — J029 Acute pharyngitis, unspecified: Secondary | ICD-10-CM | POA: Diagnosis not present

## 2016-09-26 DIAGNOSIS — I4891 Unspecified atrial fibrillation: Secondary | ICD-10-CM | POA: Diagnosis not present

## 2016-09-26 HISTORY — DX: Personal history of other diseases of the digestive system: Z87.19

## 2016-09-26 LAB — GLUCOSE, CAPILLARY
GLUCOSE-CAPILLARY: 175 mg/dL — AB (ref 65–99)
Glucose-Capillary: 168 mg/dL — ABNORMAL HIGH (ref 65–99)

## 2016-09-26 LAB — COMPREHENSIVE METABOLIC PANEL
ALBUMIN: 3.3 g/dL — AB (ref 3.5–5.0)
ALT: 25 U/L (ref 17–63)
AST: 19 U/L (ref 15–41)
Alkaline Phosphatase: 44 U/L (ref 38–126)
Anion gap: 7 (ref 5–15)
BUN: 68 mg/dL — AB (ref 6–20)
CHLORIDE: 103 mmol/L (ref 101–111)
CO2: 23 mmol/L (ref 22–32)
CREATININE: 2.13 mg/dL — AB (ref 0.61–1.24)
Calcium: 8.4 mg/dL — ABNORMAL LOW (ref 8.9–10.3)
GFR calc Af Amer: 34 mL/min — ABNORMAL LOW (ref >60)
GFR calc non Af Amer: 29 mL/min — ABNORMAL LOW (ref >60)
Glucose, Bld: 224 mg/dL — ABNORMAL HIGH (ref 65–99)
POTASSIUM: 4.5 mmol/L (ref 3.5–5.1)
SODIUM: 133 mmol/L — AB (ref 135–145)
Total Bilirubin: 0.6 mg/dL (ref 0.3–1.2)
Total Protein: 7.4 g/dL (ref 6.5–8.1)

## 2016-09-26 LAB — URINALYSIS, COMPLETE (UACMP) WITH MICROSCOPIC
BACTERIA UA: NONE SEEN
Bilirubin Urine: NEGATIVE
GLUCOSE, UA: NEGATIVE mg/dL
Ketones, ur: NEGATIVE mg/dL
Leukocytes, UA: NEGATIVE
Nitrite: NEGATIVE
PH: 5 (ref 5.0–8.0)
PROTEIN: 100 mg/dL — AB
SQUAMOUS EPITHELIAL / LPF: NONE SEEN
Specific Gravity, Urine: 1.017 (ref 1.005–1.030)

## 2016-09-26 LAB — APTT: aPTT: 70 seconds — ABNORMAL HIGH (ref 24–36)

## 2016-09-26 LAB — CBC
HEMATOCRIT: 39.7 % — AB (ref 40.0–52.0)
Hemoglobin: 12.8 g/dL — ABNORMAL LOW (ref 13.0–18.0)
MCH: 22.5 pg — AB (ref 26.0–34.0)
MCHC: 32.3 g/dL (ref 32.0–36.0)
MCV: 69.7 fL — AB (ref 80.0–100.0)
PLATELETS: 238 10*3/uL (ref 150–440)
RBC: 5.69 MIL/uL (ref 4.40–5.90)
RDW: 14.5 % (ref 11.5–14.5)
WBC: 5.3 10*3/uL (ref 3.8–10.6)

## 2016-09-26 LAB — PROTIME-INR
INR: 3.75
PROTHROMBIN TIME: 38 s — AB (ref 11.4–15.2)

## 2016-09-26 LAB — INFLUENZA PANEL BY PCR (TYPE A & B)
INFLAPCR: NEGATIVE
Influenza B By PCR: POSITIVE — AB

## 2016-09-26 MED ORDER — INSULIN ASPART 100 UNIT/ML ~~LOC~~ SOLN
0.0000 [IU] | Freq: Three times a day (TID) | SUBCUTANEOUS | Status: DC
  Administered 2016-09-27 – 2016-09-28 (×3): 2 [IU] via SUBCUTANEOUS
  Filled 2016-09-26 (×3): qty 2

## 2016-09-26 MED ORDER — OSELTAMIVIR PHOSPHATE 30 MG PO CAPS
30.0000 mg | ORAL_CAPSULE | Freq: Two times a day (BID) | ORAL | Status: DC
  Administered 2016-09-26 – 2016-09-28 (×4): 30 mg via ORAL
  Filled 2016-09-26 (×4): qty 1

## 2016-09-26 MED ORDER — FLUTICASONE PROPIONATE 50 MCG/ACT NA SUSP
1.0000 | Freq: Two times a day (BID) | NASAL | Status: DC
  Administered 2016-09-26 – 2016-09-28 (×4): 1 via NASAL
  Filled 2016-09-26: qty 16

## 2016-09-26 MED ORDER — HYDROCHLOROTHIAZIDE 25 MG PO TABS
25.0000 mg | ORAL_TABLET | Freq: Every day | ORAL | Status: DC
  Administered 2016-09-26 – 2016-09-28 (×3): 25 mg via ORAL
  Filled 2016-09-26 (×3): qty 1

## 2016-09-26 MED ORDER — INSULIN ASPART 100 UNIT/ML ~~LOC~~ SOLN: 0.0000 [IU] | Freq: Every day | SUBCUTANEOUS | Status: DC

## 2016-09-26 MED ORDER — LORATADINE 10 MG PO TABS
10.0000 mg | ORAL_TABLET | Freq: Every day | ORAL | Status: DC
Start: 2016-09-26 — End: 2016-09-28
  Administered 2016-09-27 – 2016-09-28 (×2): 10 mg via ORAL
  Filled 2016-09-26 (×3): qty 1

## 2016-09-26 MED ORDER — PSEUDOEPH-BROMPHEN-DM 30-2-10 MG/5ML PO SYRP: 10.0000 mL | ORAL_SOLUTION | Freq: Four times a day (QID) | ORAL | Status: DC | PRN

## 2016-09-26 MED ORDER — SODIUM CHLORIDE 0.9 % IV SOLN
INTRAVENOUS | Status: DC
  Administered 2016-09-26 – 2016-09-27 (×3): via INTRAVENOUS

## 2016-09-26 MED ORDER — DEXTROMETHORPHAN POLISTIREX ER 30 MG/5ML PO SUER
30.0000 mg | Freq: Two times a day (BID) | ORAL | Status: DC | PRN
  Filled 2016-09-26: qty 5

## 2016-09-26 MED ORDER — DILTIAZEM HCL ER COATED BEADS 240 MG PO CP24
240.0000 mg | ORAL_CAPSULE | Freq: Every day | ORAL | Status: DC
  Administered 2016-09-26 – 2016-09-28 (×3): 240 mg via ORAL
  Filled 2016-09-26 (×3): qty 1

## 2016-09-26 MED ORDER — PREGABALIN 50 MG PO CAPS
50.0000 mg | ORAL_CAPSULE | Freq: Three times a day (TID) | ORAL | Status: DC
  Administered 2016-09-26 – 2016-09-28 (×6): 50 mg via ORAL
  Filled 2016-09-26 (×6): qty 1

## 2016-09-26 MED ORDER — AMIODARONE HCL 200 MG PO TABS
400.0000 mg | ORAL_TABLET | Freq: Every day | ORAL | Status: DC
  Administered 2016-09-26 – 2016-09-28 (×3): 400 mg via ORAL
  Filled 2016-09-26 (×3): qty 2

## 2016-09-26 MED ORDER — VALSARTAN-HYDROCHLOROTHIAZIDE 320-25 MG PO TABS: 1.0000 | ORAL_TABLET | Freq: Every day | ORAL | Status: DC

## 2016-09-26 MED ORDER — ROSUVASTATIN CALCIUM 10 MG PO TABS
20.0000 mg | ORAL_TABLET | Freq: Every day | ORAL | Status: DC
  Administered 2016-09-26 – 2016-09-28 (×3): 20 mg via ORAL
  Filled 2016-09-26 (×3): qty 2

## 2016-09-26 MED ORDER — METOPROLOL TARTRATE 25 MG PO TABS
25.0000 mg | ORAL_TABLET | Freq: Two times a day (BID) | ORAL | Status: DC
  Administered 2016-09-26 – 2016-09-28 (×4): 25 mg via ORAL
  Filled 2016-09-26 (×4): qty 1

## 2016-09-26 MED ORDER — DOCUSATE SODIUM 100 MG PO CAPS: 100.0000 mg | ORAL_CAPSULE | Freq: Two times a day (BID) | ORAL | Status: DC | PRN

## 2016-09-26 MED ORDER — IRBESARTAN 300 MG PO TABS
300.0000 mg | ORAL_TABLET | Freq: Every day | ORAL | Status: DC
  Administered 2016-09-26 – 2016-09-28 (×3): 300 mg via ORAL
  Filled 2016-09-26 (×3): qty 1

## 2016-09-26 MED ORDER — NITROGLYCERIN 0.4 MG SL SUBL: 0.4000 mg | SUBLINGUAL_TABLET | SUBLINGUAL | Status: DC | PRN

## 2016-09-26 MED ORDER — SODIUM CHLORIDE 0.9 % IV SOLN
1.5000 g | Freq: Three times a day (TID) | INTRAVENOUS | Status: DC
  Administered 2016-09-26 – 2016-09-27 (×3): 1.5 g via INTRAVENOUS
  Filled 2016-09-26 (×8): qty 1.5

## 2016-09-26 MED ORDER — SODIUM CHLORIDE 0.9 % IV BOLUS (SEPSIS)
1000.0000 mL | Freq: Once | INTRAVENOUS | Status: AC
  Administered 2016-09-26: 1000 mL via INTRAVENOUS

## 2016-09-26 MED ORDER — GLIPIZIDE 5 MG PO TABS
10.0000 mg | ORAL_TABLET | Freq: Two times a day (BID) | ORAL | Status: DC
  Administered 2016-09-26 – 2016-09-28 (×3): 10 mg via ORAL
  Filled 2016-09-26 (×4): qty 2

## 2016-09-26 MED ORDER — GUAIFENESIN ER 600 MG PO TB12: 600.0000 mg | ORAL_TABLET | Freq: Two times a day (BID) | ORAL | Status: DC | PRN

## 2016-09-26 MED ORDER — MENTHOL 3 MG MT LOZG
1.0000 | LOZENGE | OROMUCOSAL | Status: DC | PRN
  Administered 2016-09-27: 3 mg via ORAL
  Filled 2016-09-26: qty 9

## 2016-09-26 NOTE — Progress Notes (Signed)
Family Meeting Note  Advance Directive:no  Today a meeting took place with the Patient and spouse.  The following clinical team members were present during this meeting:MD  The following were discussed:Patient's diagnosis: CHF, A fib, hemoptysis, CKD stage 3  Patient's progosis: Unable to determine and Goals for treatment: Full Code  Additional follow-up to be provided: PMD  Time spent during discussion:20 minutes  Alle Difabio, Heath Gold, MD

## 2016-09-26 NOTE — Progress Notes (Signed)
ANTICOAGULATION CONSULT NOTE - Initial Consult  Pharmacy Consult for warfarin Indication: atrial fibrillation  No Known Allergies  Patient Measurements: Height:  (180.3 cm) Weight: 200 lb (90.7 kg) IBW/kg (Calculated) : 75.3   Vital Signs: Temp: 98.1 F (36.7 C) (04/01 1157) Temp Source: Oral (04/01 1157) BP: 115/68 (04/01 1157) Pulse Rate: 72 (04/01 1157)  Labs:  Recent Labs  09/26/16 1158  HGB 12.8*  HCT 39.7*  PLT 238  APTT 70*  LABPROT 38.0*  INR 3.75  CREATININE 2.13*    Estimated Creatinine Clearance: 36.1 mL/min (A) (by C-G formula based on SCr of 2.13 mg/dL (H)).   Medical History: Past Medical History:  Diagnosis Date  . Atrial fibrillation and flutter (HCC)   . CHF (congestive heart failure) (HCC)   . Diabetes mellitus with complication (HCC)   . Essential hypertension   . Gastric ulcer   . GI bleeding   . Myocardial infarction   . Ventricular tachycardia (HCC) 05/20/2014    Assessment: 71yoM who presents with hemoptysis. Patient on warfarin at home for atrial fibrillation.  Home warfarin dose  Monday through Friday and 2.5mg  Saturday and Sunday.  INR on admission 3.75  Date   INR  Dose 4/1  3.75   Goal of Therapy:  INR 2-3 Monitor platelets by anticoagulation protocol: Yes   Plan:  Will not give a dose of warfarin today due to high INR. Recheck INR in the AM.   Delsa Bern, PharmD 09/26/2016,3:54 PM

## 2016-09-26 NOTE — Progress Notes (Signed)
Notified Dr. Esaw Grandchild that pt came back positive for influenza B. Per MD place order for fingerstick and ACHS order as pt is diabetic.  Will place order for tamiflu 30 mg BID.

## 2016-09-26 NOTE — ED Provider Notes (Signed)
[facility] Emergency Department Provider Note  ____________________________________________  Time seen: Approximately 3:13 PM  I have reviewed the triage vital signs and the nursing notes.   HISTORY  Chief Complaint Hematemesis and Hematuria   HPI [name] is a 71 y.o. male history CHF, A. fib on Coumadin, hypertension, diabetes, CAD who presents for evaluation of hemoptysis and hematuria. Patient reports one week of flulike symptoms with fever, cough, congestion, generalized weakness, body aches and initially diarrhea. Has not been eating or drinking over the course of the last week. Was seen here 4 days ago and told he had a viral syndrome. Today patient had one episode of gross hematuria home. Also has had 2 episodes of epistaxis and its coughing up yellow sputum with streaks of blood. He denies dizziness chest pain, shortness of breath. No fever today.  Past Medical History:  Diagnosis Date  . Atrial fibrillation and flutter (HCC)   . CHF (congestive heart failure) (HCC)   . Diabetes mellitus with complication (HCC)   . Essential hypertension   . Gastric ulcer   . GI bleeding   . Myocardial infarction   . Ventricular tachycardia (HCC) 05/20/2014    Patient Active Problem List   Diagnosis Date Noted  . Left knee pain 08/08/2015  . Pain and swelling of left knee 07/16/2015  . Atrial fibrillation (HCC) 07/16/2015  . Dyspnea 04/17/2015  . Atypical atrial flutter (HCC) 04/17/2015  . Acute renal insufficiency 04/17/2015  . Sinus bradycardia 04/17/2015  . Atrial flutter (HCC) 04/16/2015  . A-fib (HCC) 02/05/2015  . Essential hypertension 05/21/2014  . CKD (chronic kidney disease) stage 3, GFR 30-59 ml/min 05/21/2014  . Dyslipidemia 05/21/2014  . 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 Anemia 05/21/2014  . Ventricular tachycardia (HCC) 05/20/2014  . NSTEMI (non-ST elevated myocardial infarction)  (HCC) 05/20/2014    Past Surgical History:  Procedure Laterality Date  . ELECTROPHYSIOLOGIC STUDY N/A 07/28/2015   Procedure: Cardioversion;  Surgeon: Lamar Blinks, MD;  Location: [facility] ORS;  Service: Cardiovascular;  Laterality: N/A;  . HERNIA REPAIR    . LEFT HEART CATHETERIZATION WITH CORONARY ANGIOGRAM N/A 05/20/2014   Procedure: LEFT HEART CATHETERIZATION WITH CORONARY ANGIOGRAM;  Surgeon: Micheline Chapman, MD;  Location: [facility] CATH LAB;  Service: Cardiovascular;  Laterality: N/A;    Prior to Admission medications   Medication Sig Start Date End Date Taking? Authorizing Provider  amiodarone (PACERONE) 400 MG tablet Take 1 tablet (400 mg total) by mouth daily. 04/17/15   Katharina Caper, MD  brompheniramine-pseudoephedrine-DM 30-2-10 MG/5ML syrup Take 10 mLs by mouth 4 (four) times daily as needed. 09/22/16   Delorise Royals Cuthriell, PA-C  cetirizine (ZYRTEC) 10 MG tablet Take 1 tablet (10 mg total) by mouth daily. 09/22/16   Delorise Royals Cuthriell, PA-C  diltiazem (CARDIZEM CD) 240 MG 24 hr capsule Take 1 capsule (240 mg total) by mouth daily. 07/21/15   Sital Mody, MD  Dulaglutide (TRULICITY) 0.75 MG/0.5ML SOPN Start 0.75 mg [place] q week x 2 weeks, then increase to 1.5 mg [place] q week. 08/30/16   Ellyn Hack, MD  fluticasone (FLONASE) 50 MCG/ACT nasal spray Place 1 spray into both nostrils 2 (two) times daily. 09/22/16   Delorise Royals Cuthriell, PA-C  glipiZIDE (GLUCOTROL) 10 MG tablet TAKE ONE TABLET BY MOUTH TWICE DAILY BEFORE  A  MEAL. 08/30/16   Ellyn Hack, MD  hydrALAZINE (APRESOLINE) 25 MG tablet Take 1 tablet (25 mg  total) by mouth 3 (three) times daily. 08/30/16   Ellyn Hack, MD  metoprolol tartrate (LOPRESSOR) 25 MG tablet TAKE ONE TABLET BY MOUTH TWICE DAILY 10/31/15   Ellyn Hack, MD  nitroGLYCERIN (NITROSTAT) 0.4 MG SL tablet Place 0.4 mg under the tongue every 5 (five) minutes as needed.    Historical Provider, MD  pregabalin (LYRICA) 50 MG capsule Take 1 capsule (50 mg total) by  mouth 3 (three) times daily. 04/12/16   Ellyn Hack, MD  rosuvastatin (CRESTOR) 20 MG tablet TAKE ONE TABLET BY MOUTH ONCE DAILY 07/13/16   Ellyn Hack, MD  valsartan-hydrochlorothiazide (DIOVAN-HCT) 320-25 MG tablet Take 1 tablet by mouth daily.    Historical Provider, MD  warfarin (COUMADIN) 5 MG tablet Take 2.5-5 mg by mouth daily. Pt takes one tablet Monday-Friday and one-half tablet Saturday and Sunday.    Historical Provider, MD    Allergies Patient has no known allergies.  Family History  Problem Relation Age of Onset  . Congestive Heart Failure Mother     Social History Social History  Substance Use Topics  . Smoking status: Former Smoker    Quit date: 04/28/1964  . Smokeless tobacco: Never Used  . Alcohol use No    Review of Systems  Constitutional: + fever, chills, Bodyaches generalized weakness Eyes: Negative for visual changes. ENT: Negative for sore throat. Neck: No neck pain  Cardiovascular: Negative for chest pain. Respiratory: Negative for shortness of breath. + hemoptysis Gastrointestinal: Negative for abdominal pain, vomiting or diarrhea. + Decreased appetite Genitourinary: Negative for dysuria. + hematuria Musculoskeletal: Negative for back pain. Skin: Negative for rash. Neurological: Negative for headaches, weakness or numbness. Psych: No SI or HI  ____________________________________________   PHYSICAL EXAM:  VITAL SIGNS: ED Triage Vitals [09/26/16 1157]  Enc Vitals Group     BP 115/68     Pulse Rate 72     Resp 16     Temp 98.1 F (36.7 C)     Temp Source Oral     SpO2 98 %     Weight 200 lb (90.7 kg)     Height  (1.803 m)     Head Circumference      Peak Flow      Pain Score 0     Pain Loc      Pain Edu?      Excl. in GC?     Constitutional: Alert and oriented. Well appearing and in no apparent distress. HEENT:      Head: Normocephalic and atraumatic.         Eyes: Conjunctivae are normal. Sclera is non-icteric.  EOMI. PERRL      Mouth/Throat: Mucous membranes are moist.       Neck: Supple with no signs of meningismus. Cardiovascular: Regular rate and rhythm. No murmurs, gallops, or rubs. 2+ symmetrical distal pulses are present in all extremities. No JVD. Respiratory: Normal respiratory effort. Lungs are clear to auscultation bilaterally. No wheezes, crackles, or rhonchi.  Gastrointestinal: Soft, non tender, and non distended with positive bowel sounds. No rebound or guarding. Genitourinary: No CVA tenderness. Musculoskeletal: Nontender with normal range of motion in all extremities. No edema, cyanosis, or erythema of extremities. Neurologic: Normal speech and language. Face is symmetric. Moving all extremities. No gross focal neurologic deficits are appreciated. Skin: Skin is warm, dry and intact. No rash noted. Psychiatric: Mood and affect are normal. Speech and behavior are normal.  ____________________________________________   LABS (all  labs ordered are listed, but only abnormal results are displayed)  Labs Reviewed  COMPREHENSIVE METABOLIC PANEL - Abnormal; Notable for the following:       Result Value   Sodium 133 (*)    Glucose, Bld 224 (*)    BUN 68 (*)    Creatinine, Ser 2.13 (*)    Calcium 8.4 (*)    Albumin 3.3 (*)    GFR calc non Af Amer 29 (*)    GFR calc Af Amer 34 (*)    All other components within normal limits  CBC - Abnormal; Notable for the following:    Hemoglobin 12.8 (*)    HCT 39.7 (*)    MCV 69.7 (*)    MCH 22.5 (*)    All other components within normal limits  APTT - Abnormal; Notable for the following:    aPTT 70 (*)    All other components within normal limits  PROTIME-INR - Abnormal; Notable for the following:    Prothrombin Time 38.0 (*)    All other components within normal limits  URINE CULTURE  URINALYSIS, COMPLETE (UACMP) WITH MICROSCOPIC  INFLUENZA PANEL BY PCR (TYPE A & B)   ____________________________________________  EKG  none    ____________________________________________  RADIOLOGY  CXR: Negative  ____________________________________________   PROCEDURES  Procedure(s) performed: None Procedures Critical Care performed:  None ____________________________________________   INITIAL IMPRESSION / ASSESSMENT AND PLAN / ED COURSE  71 y.o. male history CHF, A. fib on Coumadin, hypertension, diabetes, CAD who presents for evaluation of hemoptysis and hematuria in the setting of one week of flu like symptoms. Patient is well appearing, normal vitals, lungs are clear to auscultation, oropharynx is clear, remaining of his physical exam with no acute findings. Chest x-ray no evidence of pneumonia. Flu swab is pending. Patient with acute on chronic kidney injury with creatinine of 2.13 (baseline is 1.4-1.5). Patient also with supratherapeutic INR. We'll start patient on IV fluids and admitted to the hospitalist service.     Pertinent labs & imaging results that were available during my care of the patient were reviewed by me and considered in my medical decision making (see chart for details).    ____________________________________________   FINAL CLINICAL IMPRESSION(S) / ED DIAGNOSES  Final diagnoses:  Acute renal failure superimposed on chronic kidney disease, unspecified CKD stage, unspecified acute renal failure type (HCC)  Supratherapeutic INR  Gross hematuria      NEW MEDICATIONS STARTED DURING THIS VISIT:  New Prescriptions   No medications on file     Note:  This document was prepared using Dragon voice recognition software and may include unintentional dictation errors.    Nita Sickle, MD 09/26/16 [phone]

## 2016-09-26 NOTE — H&P (Signed)
[employer] - [place] at [facility]   PATIENT NAME: [name]    MR#:  [id number]  DATE OF BIRTH:  1946-03-30  DATE OF ADMISSION:  09/26/2016  PRIMARY CARE PHYSICIAN: Brayton El, MD   REQUESTING/REFERRING PHYSICIAN: Don Perking  CHIEF COMPLAINT:   Chief Complaint  Patient presents with  . Hematemesis  . Hematuria    HISTORY OF PRESENT ILLNESS: [name]  is a 71 y.o. male with a known history of Atrophic fibrillation, CHF, diabetes, history hypertension, GI bleed, mother infarction- takes Coumadin at home every day, started having cough and sore throat for last 4-5 days.  he came to emergency room 4 days ago when he was sent home with prescription of some cough syrup. He continued to get worse on his coughing and yesterday he also noted some blood streaks with his sputum which was yellowish color. He also has some pain in his throat which is bothering him with swallowing the food but he is still able to swallow all type of food and no difficulty in breathing. His wife also noted his urine was very dark and she suspected there was some blood in urine and also yesterday so decided to come to emergency room today. He is noted to be dehydrated with acute on chronic renal failure and his INR is higher than the therapeutic range so with presence of hemodialysis and hematuria he suggested to be admitted to medical service.  PAST MEDICAL HISTORY:   Past Medical History:  Diagnosis Date  . Atrial fibrillation and flutter (HCC)   . CHF (congestive heart failure) (HCC)   . Diabetes mellitus with complication (HCC)   . Essential hypertension   . Gastric ulcer   . GI bleeding   . Myocardial infarction   . Ventricular tachycardia (HCC) 05/20/2014    PAST SURGICAL HISTORY: Past Surgical History:  Procedure Laterality Date  . ELECTROPHYSIOLOGIC STUDY N/A 07/28/2015   Procedure: Cardioversion;  Surgeon: Lamar Blinks, MD;  Location: [facility] ORS;  Service: Cardiovascular;   Laterality: N/A;  . HERNIA REPAIR    . LEFT HEART CATHETERIZATION WITH CORONARY ANGIOGRAM N/A 05/20/2014   Procedure: LEFT HEART CATHETERIZATION WITH CORONARY ANGIOGRAM;  Surgeon: Micheline Chapman, MD;  Location: [facility] CATH LAB;  Service: Cardiovascular;  Laterality: N/A;    SOCIAL HISTORY:  Social History  Substance Use Topics  . Smoking status: Former Smoker    Quit date: 04/28/1964  . Smokeless tobacco: Never Used  . Alcohol use No    FAMILY HISTORY:  Family History  Problem Relation Age of Onset  . Congestive Heart Failure Mother   . Breast cancer Mother     DRUG ALLERGIES: No Known Allergies  REVIEW OF SYSTEMS:   CONSTITUTIONAL: No fever, fatigue or weakness.  EYES: No blurred or double vision.  EARS, NOSE, AND THROAT: No tinnitus or ear pain. No pain in his throat while swallowing the food. RESPIRATORY: Positive for cough, no shortness of breath, wheezing or hemoptysis.  CARDIOVASCULAR: No chest pain, orthopnea, edema.  GASTROINTESTINAL: No nausea, vomiting, diarrhea or abdominal pain.  GENITOURINARY: No dysuria, his wife suspected some hematuria.  ENDOCRINE: No polyuria, nocturia,  HEMATOLOGY: No anemia, easy bruising or bleeding SKIN: No rash or lesion. MUSCULOSKELETAL: No joint pain or arthritis.   NEUROLOGIC: No tingling, numbness, weakness.  PSYCHIATRY: No anxiety or depression.   MEDICATIONS AT HOME:  Prior to Admission medications   Medication Sig Start Date End Date Taking? Authorizing Provider  amiodarone (PACERONE) 400 MG tablet Take  1 tablet (400 mg total) by mouth daily. 04/17/15  Yes Katharina Caper, MD  brompheniramine-pseudoephedrine-DM 30-2-10 MG/5ML syrup Take 10 mLs by mouth 4 (four) times daily as needed. 09/22/16  Yes Christiane Ha D Cuthriell, PA-C  diltiazem (CARDIZEM CD) 240 MG 24 hr capsule Take 1 capsule (240 mg total) by mouth daily. 07/21/15  Yes Sital Mody, MD  glipiZIDE (GLUCOTROL) 10 MG tablet TAKE ONE TABLET BY MOUTH TWICE DAILY BEFORE  A  MEAL.  08/30/16  Yes Ellyn Hack, MD  hydrALAZINE (APRESOLINE) 25 MG tablet Take 1 tablet (25 mg total) by mouth 3 (three) times daily. 08/30/16  Yes Ellyn Hack, MD  metoprolol tartrate (LOPRESSOR) 25 MG tablet TAKE ONE TABLET BY MOUTH TWICE DAILY 10/31/15  Yes Ellyn Hack, MD  nitroGLYCERIN (NITROSTAT) 0.4 MG SL tablet Place 0.4 mg under the tongue every 5 (five) minutes as needed.   Yes Historical Provider, MD  warfarin (COUMADIN) 5 MG tablet Take 2.5-5 mg by mouth daily. Pt takes one tablet Monday-Friday and one-half tablet Saturday and Sunday.   Yes Historical Provider, MD  cetirizine (ZYRTEC) 10 MG tablet Take 1 tablet (10 mg total) by mouth daily. Patient not taking: Reported on 09/26/2016 09/22/16   Delorise Royals Cuthriell, PA-C  Dulaglutide (TRULICITY) 0.75 MG/0.5ML SOPN Start 0.75 mg [place] q week x 2 weeks, then increase to 1.5 mg [place] q week. Patient not taking: Reported on 09/26/2016 08/30/16   Ellyn Hack, MD  fluticasone [facility]) 50 MCG/ACT nasal spray Place 1 spray into both nostrils 2 (two) times daily. Patient not taking: Reported on 09/26/2016 09/22/16   Delorise Royals Cuthriell, PA-C  pregabalin (LYRICA) 50 MG capsule Take 1 capsule (50 mg total) by mouth 3 (three) times daily. Patient not taking: Reported on 09/26/2016 04/12/16   Ellyn Hack, MD  rosuvastatin (CRESTOR) 20 MG tablet TAKE ONE TABLET BY MOUTH ONCE DAILY Patient not taking: Reported on 09/26/2016 07/13/16   Ellyn Hack, MD  valsartan-hydrochlorothiazide (DIOVAN-HCT) 320-25 MG tablet Take 1 tablet by mouth daily.    Historical Provider, MD      PHYSICAL EXAMINATION:   VITAL SIGNS: Blood pressure 115/68, pulse 72, temperature 98.1 F (36.7 C), temperature source Oral, resp. rate 16, height  (1.803 m), weight 90.7 kg (200 lb), SpO2 98 %.  GENERAL:  71 y.o.-year-old patient lying in the bed with no acute distress.  EYES: Pupils equal, round, reactive to light and accommodation. No scleral icterus. Extraocular  muscles intact.  HEENT: Head atraumatic, normocephalic. He has redness and some erythema on his pharynx on exam. NECK:  Supple, no jugular venous distention. No thyroid enlargement, no tenderness.  LUNGS: Normal breath sounds bilaterally, no wheezing, rales,rhonchi or crepitation. No use of accessory muscles of respiration.  CARDIOVASCULAR: S1, S2 normal. No murmurs, rubs, or gallops.  ABDOMEN: Soft, nontender, nondistended. Bowel sounds present. No organomegaly or mass.  EXTREMITIES: No pedal edema, cyanosis, or clubbing.  NEUROLOGIC: Cranial nerves II through XII are intact. Muscle strength 5/5 in all extremities. Sensation intact. Gait not checked.  PSYCHIATRIC: The patient is alert and oriented x 3.  SKIN: No obvious rash, lesion, or ulcer.   LABORATORY PANEL:   CBC  Recent Labs Lab 09/26/16 1158  WBC 5.3  HGB 12.8*  HCT 39.7*  PLT 238  MCV 69.7*  MCH 22.5*  MCHC 32.3  RDW 14.5   ------------------------------------------------------------------------------------------------------------------  Chemistries   Recent Labs Lab 09/26/16 1158  NA 133*  K 4.5  CL 103  CO2 23  GLUCOSE 224*  BUN 68*  CREATININE 2.13*  CALCIUM 8.4*  AST 19  ALT 25  ALKPHOS 44  BILITOT 0.6   ------------------------------------------------------------------------------------------------------------------ estimated creatinine clearance is 36.1 mL/min (A) (by C-G formula based on SCr of 2.13 mg/dL (H)). ------------------------------------------------------------------------------------------------------------------ No results for input(s): TSH, T4TOTAL, T3FREE, THYROIDAB in the last 72 hours.  Invalid input(s): FREET3   Coagulation profile  Recent Labs Lab 09/26/16 1158  INR 3.75   ------------------------------------------------------------------------------------------------------------------- No results for input(s): DDIMER in the last 72  hours. -------------------------------------------------------------------------------------------------------------------  Cardiac Enzymes No results for input(s): CKMB, TROPONINI, MYOGLOBIN in the last 168 hours.  Invalid input(s): CK ------------------------------------------------------------------------------------------------------------------ Invalid input(s): POCBNP  ---------------------------------------------------------------------------------------------------------------  Urinalysis    Component Value Date/Time   COLORURINE YELLOW (A) 09/26/2016 1507   APPEARANCEUR HAZY (A) 09/26/2016 1507   APPEARANCEUR Clear 04/26/2013 0014   LABSPEC 1.017 09/26/2016 1507   LABSPEC 1.021 04/26/2013 0014   PHURINE 5.0 09/26/2016 1507   GLUCOSEU NEGATIVE 09/26/2016 1507   GLUCOSEU 50 mg/dL 09/81/1914 7829   HGBUR SMALL (A) 09/26/2016 1507   BILIRUBINUR NEGATIVE 09/26/2016 1507   BILIRUBINUR Negative 04/26/2013 0014   KETONESUR NEGATIVE 09/26/2016 1507   PROTEINUR 100 (A) 09/26/2016 1507   NITRITE NEGATIVE 09/26/2016 1507   LEUKOCYTESUR NEGATIVE 09/26/2016 1507   LEUKOCYTESUR Negative 04/26/2013 0014     RADIOLOGY: Dg Chest 2 View  Result Date: 09/26/2016 CLINICAL DATA:  Pt reports was seen last week for URI, symptoms have not improved. Family reports pt has been having epitaxis, hemoptysis and hematuria. Pt complains of sore throat. Hx/o a-fib, CHF, diabetes, HTN, and MI; former smoker. EXAM: CHEST  2 VIEW COMPARISON:  09/22/2016 FINDINGS: The heart size and mediastinal contours are within normal limits. Both lungs are clear. No pleural effusion or pneumothorax. The visualized skeletal structures are unremarkable. IMPRESSION: No active cardiopulmonary disease. Electronically Signed   By: Amie Portland M.D.   On: 09/26/2016 14:44    EKG: Orders placed or performed during the hospital encounter of 08/08/15  . ED EKG within 10 minutes  . ED EKG within 10 minutes  . EKG 12-Lead   . EKG 12-Lead  . EKG    IMPRESSION AND PLAN:  * Hemoptysis with acute pharyngitis   We will give Unasyn for now and may give Augmentin on discharge   Hemoglobin is stable so there is not excessive bleeding.   Streptococcal antigen test is negative, influenza is sent still pending results.  * Acute on chronic renal failure on CK D stage III   We'll give IV fluids and monitor tomorrow.  * Atrial fibrillation   Rate is controlled, he is on warfarin at home but INR is higher than therapeutic range, currently hemoglobin is stable so we will just hold the warfarin and monitor his INR and hemoglobin.  * Complaint of hematuria   As per patient his wife noted some blood in his urine yesterday, but here his urinalysis does not show any blood most likely it was dark urine secondary to his dehydration.   Urine culture is sent by ER physician, we'll continue monitoring.  All the records are reviewed and case discussed with ED provider. Management plans discussed with the patient, family and they are in agreement.  CODE STATUS: Full code Code Status History    Date Active Date Inactive Code Status Order ID Comments User Context   07/16/2015 12:58 PM 07/21/2015  3:07 PM Full Code [id number]  Alford Highland, MD ED  04/16/2015  8:16 PM 04/17/2015  5:07 PM Full Code [id number]  Ramonita Lab, MD Inpatient   05/20/2014  6:07 PM 05/27/2014  2:23 PM Full Code [id number]  Tonny Bollman, MD Inpatient   05/20/2014  6:07 PM 05/20/2014  6:07 PM Full Code [id number]  Tonny Bollman, MD Inpatient     Patient's wife was present in the room during my visit.  TOTAL TIME TAKING CARE OF THIS PATIENT: 50 minutes.    Altamese Dilling M.D on 09/26/2016   Between 7am to 6pm - Pager - [phone]  After 6pm go to [email or web address] - password [email or web address]  [facility]  Office  [phone]  CC: Primary care physician; Brayton El, MD   Note: This dictation was prepared with Dragon dictation  along with smaller phrase technology. Any transcriptional errors that result from this process are unintentional.

## 2016-09-26 NOTE — ED Triage Notes (Addendum)
Pt reports was seen last week for URI, symptoms have not improved. Family reports pt has been having epitaxis, hemoptysis and hematuria. Pt complains of sore throat.

## 2016-09-27 LAB — CBC
HCT: 37.5 % — ABNORMAL LOW (ref 40.0–52.0)
HEMOGLOBIN: 12 g/dL — AB (ref 13.0–18.0)
MCH: 22.4 pg — ABNORMAL LOW (ref 26.0–34.0)
MCHC: 31.9 g/dL — ABNORMAL LOW (ref 32.0–36.0)
MCV: 70.2 fL — ABNORMAL LOW (ref 80.0–100.0)
PLATELETS: 234 10*3/uL (ref 150–440)
RBC: 5.34 MIL/uL (ref 4.40–5.90)
RDW: 14.4 % (ref 11.5–14.5)
WBC: 5.4 10*3/uL (ref 3.8–10.6)

## 2016-09-27 LAB — GLUCOSE, CAPILLARY
GLUCOSE-CAPILLARY: 140 mg/dL — AB (ref 65–99)
Glucose-Capillary: 130 mg/dL — ABNORMAL HIGH (ref 65–99)
Glucose-Capillary: 178 mg/dL — ABNORMAL HIGH (ref 65–99)
Glucose-Capillary: 165 mg/dL — ABNORMAL HIGH (ref 65–99)

## 2016-09-27 LAB — URINE CULTURE: CULTURE: NO GROWTH

## 2016-09-27 LAB — BASIC METABOLIC PANEL
ANION GAP: 8 (ref 5–15)
BUN: 57 mg/dL — ABNORMAL HIGH (ref 6–20)
CALCIUM: 8.2 mg/dL — AB (ref 8.9–10.3)
CHLORIDE: 107 mmol/L (ref 101–111)
CO2: 23 mmol/L (ref 22–32)
Creatinine, Ser: 1.72 mg/dL — ABNORMAL HIGH (ref 0.61–1.24)
GFR calc Af Amer: 44 mL/min — ABNORMAL LOW (ref >60)
GFR calc non Af Amer: 38 mL/min — ABNORMAL LOW (ref >60)
Glucose, Bld: 142 mg/dL — ABNORMAL HIGH (ref 65–99)
Potassium: 4.2 mmol/L (ref 3.5–5.1)
SODIUM: 138 mmol/L (ref 135–145)

## 2016-09-27 LAB — PROTIME-INR
INR: 4.45
PROTHROMBIN TIME: 43.6 s — AB (ref 11.4–15.2)

## 2016-09-27 MED ORDER — PHENOL 1.4 % MT LIQD
1.0000 | OROMUCOSAL | Status: DC | PRN
  Administered 2016-09-27 (×2): 1 via OROMUCOSAL
  Filled 2016-09-27: qty 177

## 2016-09-27 NOTE — Progress Notes (Signed)
ANTICOAGULATION CONSULT NOTE   Pharmacy Consult for warfarin Indication: atrial fibrillation  No Known Allergies  Patient Measurements: Height:  (180.3 cm) Weight: 198 lb 9.6 oz (90.1 kg) IBW/kg (Calculated) : 75.3   Vital Signs: Temp: 97.9 F (36.6 C) (04/02 0556) Temp Source: Oral (04/02 0556) BP: 140/55 (04/02 1013) Pulse Rate: 58 (04/02 1013)  Labs:  Recent Labs  09/26/16 1158 09/27/16 0320  HGB 12.8* 12.0*  HCT 39.7* 37.5*  PLT 238 234  APTT 70*  --   LABPROT 38.0* 43.6*  INR 3.75 4.45*  CREATININE 2.13* 1.72*    Estimated Creatinine Clearance: 41.3 mL/min (A) (by C-G formula based on SCr of 1.72 mg/dL (H)).   Medical History: Past Medical History:  Diagnosis Date  . Atrial fibrillation and flutter (HCC)   . CHF (congestive heart failure) (HCC)   . Diabetes mellitus with complication (HCC)   . Essential hypertension   . Gastric ulcer   . GI bleeding   . History of hiatal hernia   . Myocardial infarction   . Ventricular tachycardia (HCC) 05/20/2014    Assessment: 71yoM who presents with hemoptysis. Patient on warfarin at home for atrial fibrillation.  Home warfarin dose  Monday through Friday and 2.5mg  Saturday and Sunday.  INR on admission 3.75  Date   INR  Dose 4/1  3.75                 No warfarin 4/2                   4.45   Goal of Therapy:  INR 2-3 Monitor platelets by anticoagulation protocol: Yes   Plan:  Will not give a dose of warfarin today due to high INR. Recheck INR in the AM.   Clovia Cuff, PharmD, BCPS 09/27/2016 10:31 AM

## 2016-09-27 NOTE — Progress Notes (Signed)
Spoke with Dr. Imogene Burn about blood glucose of 130. Per MD do not give glipizide or 1 unit sliding scale as pt has a sore throat and is not eating well.

## 2016-09-27 NOTE — Progress Notes (Addendum)
Notified Dr. Imogene Burn of BP ofd 140/55 and puls of 58 per MD give amiodarone and cardizem now. Hold Metoprolol and BP meds at this time will recehck in 1 hour and see if he needs it.

## 2016-09-27 NOTE — Progress Notes (Signed)
Dr Emmit Pomfret notified INR 4.45, coumadin still being held, acknowledged, no new orders.

## 2016-09-27 NOTE — Progress Notes (Addendum)
[employer] - [place] at [facility]   PATIENT NAME: Chandon Lazcano    MR#:  [id number]  DATE OF BIRTH:  10-Aug-1945  SUBJECTIVE:  CHIEF COMPLAINT:   Chief Complaint  Patient presents with  . Hematemesis  . Hematuria   Cough with sputum mixed with a little blood. Sore throat. No chest pain or shortness of breath. REVIEW OF SYSTEMS:  Review of Systems  Constitutional: Negative for chills, fever and malaise/fatigue.  HENT: Positive for sore throat. Negative for congestion, nosebleeds and sinus pain.   Eyes: Negative for blurred vision and double vision.  Respiratory: Positive for cough, hemoptysis and sputum production. Negative for shortness of breath and wheezing.   Cardiovascular: Negative for chest pain, palpitations and leg swelling.  Gastrointestinal: Negative for abdominal pain, blood in stool, constipation, diarrhea, heartburn, melena, nausea and vomiting.  Genitourinary: Negative for dysuria and hematuria.  Musculoskeletal: Negative for back pain.  Skin: Negative for rash.  Neurological: Negative for dizziness, focal weakness, loss of consciousness and weakness.  Psychiatric/Behavioral: Negative for depression. The patient is not nervous/anxious.     DRUG ALLERGIES:  No Known Allergies VITALS:  Blood pressure (!) 144/63, pulse 61, temperature 97.4 F (36.3 C), temperature source Oral, resp. rate 20, height  (1.803 m), weight 198 lb 9.6 oz (90.1 kg), SpO2 98 %. PHYSICAL EXAMINATION:  Physical Exam  Constitutional: He is oriented to person, place, and time and well-developed, well-nourished, and in no distress.  HENT:  Head: Normocephalic.  Mouth/Throat: Oropharynx is clear and moist.  Eyes: Conjunctivae and EOM are normal.  Neck: Normal range of motion. Neck supple. No JVD present. No tracheal deviation present.  Cardiovascular: Normal rate, regular rhythm and normal heart sounds.  Exam reveals no gallop.   No murmur heard. Pulmonary/Chest:  Effort normal and breath sounds normal. No respiratory distress.  Abdominal: Soft. Bowel sounds are normal. He exhibits no distension. There is no tenderness.  Musculoskeletal: Normal range of motion. He exhibits no edema or tenderness.  Neurological: He is alert and oriented to person, place, and time. No cranial nerve deficit.  Skin: No rash noted. No erythema.  Psychiatric: Affect and judgment normal.   LABORATORY PANEL:  Male CBC  Recent Labs Lab 09/27/16 0320  WBC 5.4  HGB 12.0*  HCT 37.5*  PLT 234   ------------------------------------------------------------------------------------------------------------------ Chemistries   Recent Labs Lab 09/26/16 1158 09/27/16 0320  NA 133* 138  K 4.5 4.2  CL 103 107  CO2 23 23  GLUCOSE 224* 142*  BUN 68* 57*  CREATININE 2.13* 1.72*  CALCIUM 8.4* 8.2*  AST 19  --   ALT 25  --   ALKPHOS 44  --   BILITOT 0.6  --    RADIOLOGY:  No results found. ASSESSMENT AND PLAN:   * Hemoptysis due to cough, influenza B. Discontinue Unasyn.   Hemoglobin is stable.   Streptococcal antigen test is negative, influenza B positive. On tamiflu.  * Acute on chronic renal failure on CKD stage III Improving with IV fluid support, follow-up BMP.  * Atrial fibrillation   Rate is controlled, he is on warfarin at home but INR is 4.45,  monitor his INR and hemoglobin.Continue amiodarone, Cardizem and Lopressor.  * Complaint of hematuria   As per patient his wife noted some blood in his urine 2 days ago, but here his urinalysis does not show any blood most likely it was dark urine secondary to his dehydration.  Diabetes. On sliding scale.  Hypertension. Continue  hypertension medication.  All the records are reviewed and case discussed with Care Management/Social Worker. Management plans discussed with the patient, his wife and they are in agreement.  CODE STATUS: Full Code  TOTAL TIME TAKING CARE OF THIS PATIENT: 33 minutes.   More  than 50% of the time was spent in counseling/coordination of care: YES  POSSIBLE D/C IN 1-2 DAYS, DEPENDING ON CLINICAL CONDITION.   Shaune Pollack M.D on 09/27/2016 at 2:48 PM  Between 7am to 6pm - Pager - [phone]  After 6pm go to [email or web address] - Social research officer, government  [facility]  Office  [phone]  CC: Primary care physician; Brayton El, MD  Note: This dictation was prepared with Dragon dictation along with smaller phrase technology. Any transcriptional errors that result from this process are unintentional.

## 2016-09-28 LAB — BASIC METABOLIC PANEL
ANION GAP: 4 — AB (ref 5–15)
BUN: 42 mg/dL — ABNORMAL HIGH (ref 6–20)
CALCIUM: 7.9 mg/dL — AB (ref 8.9–10.3)
CO2: 24 mmol/L (ref 22–32)
Chloride: 108 mmol/L (ref 101–111)
Creatinine, Ser: 1.68 mg/dL — ABNORMAL HIGH (ref 0.61–1.24)
GFR calc Af Amer: 45 mL/min — ABNORMAL LOW (ref >60)
GFR, EST NON AFRICAN AMERICAN: 39 mL/min — AB (ref >60)
GLUCOSE: 194 mg/dL — AB (ref 65–99)
POTASSIUM: 4.4 mmol/L (ref 3.5–5.1)
Sodium: 136 mmol/L (ref 135–145)

## 2016-09-28 LAB — PROTIME-INR
INR: 3.25
PROTHROMBIN TIME: 33.9 s — AB (ref 11.4–15.2)

## 2016-09-28 LAB — HEMOGLOBIN: HEMOGLOBIN: 11.2 g/dL — AB (ref 13.0–18.0)

## 2016-09-28 LAB — GLUCOSE, CAPILLARY: GLUCOSE-CAPILLARY: 174 mg/dL — AB (ref 65–99)

## 2016-09-28 MED ORDER — WARFARIN SODIUM 2.5 MG PO TABS
2.5000 mg | ORAL_TABLET | Freq: Once | ORAL | Status: DC
  Filled 2016-09-28: qty 1

## 2016-09-28 MED ORDER — BENZONATATE 100 MG PO CAPS: 100.0000 mg | ORAL_CAPSULE | Freq: Three times a day (TID) | ORAL | 0 refills | Status: DC | PRN

## 2016-09-28 MED ORDER — OSELTAMIVIR PHOSPHATE 30 MG PO CAPS
30.0000 mg | ORAL_CAPSULE | Freq: Two times a day (BID) | ORAL | 0 refills | Status: DC
Start: 2016-09-28 — End: 2016-11-30

## 2016-09-28 NOTE — Care Management Important Message (Signed)
Important Message  Patient Details  Name: [name] MRN: [id number] Date of Birth: [birth year]   Medicare Important Message Given:  N/A - LOS <3 / Initial given by admissions    Chapman Fitch, RN 09/28/2016, 10:19 AM

## 2016-09-28 NOTE — Progress Notes (Signed)
Pt A and O x 4. VSS. Pt tolerating diet well. No complaints of pain or nausea. IV removed intact, prescriptions given. Pt voiced understanding of discharge instructions with no further questions. Pt discharged via wheelchair with nurse aide.   

## 2016-09-28 NOTE — Discharge Instructions (Signed)
Follow up INR with PCP. Heart healthy and ADA diet.

## 2016-09-28 NOTE — Progress Notes (Addendum)
ANTICOAGULATION CONSULT NOTE   Pharmacy Consult for warfarin Indication: atrial fibrillation  No Known Allergies  Patient Measurements: Height:  (180.3 cm) Weight: 198 lb 9.6 oz (90.1 kg) IBW/kg (Calculated) : 75.3   Vital Signs: Temp: 97.9 F (36.6 C) (04/03 0546) Temp Source: Oral (04/03 0546) BP: 148/75 (04/03 0546) Pulse Rate: 56 (04/03 0546)  Labs:  Recent Labs  09/26/16 1158 09/27/16 0320 09/28/16 0459  HGB 12.8* 12.0* 11.2*  HCT 39.7* 37.5*  --   PLT 238 234  --   APTT 70*  --   --   LABPROT 38.0* 43.6* 33.9*  INR 3.75 4.45* 3.25  CREATININE 2.13* 1.72* 1.68*    Estimated Creatinine Clearance: 42.3 mL/min (A) (by C-G formula based on SCr of 1.68 mg/dL (H)).   Medical History: Past Medical History:  Diagnosis Date  . Atrial fibrillation and flutter (HCC)   . CHF (congestive heart failure) (HCC)   . Diabetes mellitus with complication (HCC)   . Essential hypertension   . Gastric ulcer   . GI bleeding   . History of hiatal hernia   . Myocardial infarction   . Ventricular tachycardia (HCC) 05/20/2014    Assessment: 71yoM who presents with hemoptysis. Patient on warfarin at home for atrial fibrillation.  Home warfarin dose  Monday through Friday and 2.5mg  Saturday and Sunday.  INR on admission 3.75  Date   INR  Dose 4/1  3.75                 No warfarin 4/2                   4.45                 No warfarin  4/3                   3.25                    Goal of Therapy:  INR 2-3 Monitor platelets by anticoagulation protocol: Yes   Plan:  Will give 2.5 mg of warfarin today due to INR. Recheck INR in the AM.  Demetrius Charity, PharmD  09/28/2016 9:16 AM

## 2016-09-28 NOTE — Discharge Summary (Signed)
[facility] - [place] at [facility]   PATIENT NAME: [name]    MR#:  [id number]  DATE OF BIRTH:  09-25-1945  DATE OF ADMISSION:  09/26/2016   ADMITTING PHYSICIAN: Altamese Dilling, MD  DATE OF DISCHARGE: 09/28/2016 PRIMARY CARE PHYSICIAN: Brayton El, MD   ADMISSION DIAGNOSIS:  Gross hematuria [R31.0] Supratherapeutic INR [R79.1] Acute renal failure superimposed on chronic kidney disease, unspecified CKD stage, unspecified acute renal failure type (HCC) [N17.9, N18.9] DISCHARGE DIAGNOSIS:  Principal Problem:   Hemoptysis Hemoptysis due to cough, influenza B. coagulopathy. SECONDARY DIAGNOSIS:   Past Medical History:  Diagnosis Date  . Atrial fibrillation and flutter (HCC)   . CHF (congestive heart failure) (HCC)   . Diabetes mellitus with complication (HCC)   . Essential hypertension   . Gastric ulcer   . GI bleeding   . History of hiatal hernia   . Myocardial infarction   . Ventricular tachycardia (HCC) 05/20/2014   HOSPITAL COURSE:  *Hemoptysis due to cough, influenza B. Discontinued Unasyn. Hemoptysis improved. Hemoglobin is stable. Streptococcal antigen test is negative, influenza B positive. continue tamiflu for 3 more days.  * Acute on chronic renal failure on CKD stage III Improving with IV fluid support.  * Atrial fibrillation and coagulopathy. Rate is controlled, he is on warfarin at home,  INR was 4.45, down to 3.25 today and hemoglobin is stable. Resume coumadin. Continue amiodarone, Cardizem and Lopressor.  * Complaint of hematuria As per patient his wife noted some blood in his urine 2 days ago, but here his urinalysis does not show any blood most likely it was dark urine secondary to his dehydration.  Diabetes. On sliding scale.  DISCHARGE CONDITIONS:  Stable, discharge to home today. CONSULTS OBTAINED:   DRUG ALLERGIES:  No Known Allergies DISCHARGE MEDICATIONS:   Allergies as of 09/28/2016   No Known  Allergies     Medication List    TAKE these medications   amiodarone 400 MG tablet Commonly known as:  PACERONE Take 1 tablet (400 mg total) by mouth daily.   benzonatate 100 MG capsule Commonly known as:  TESSALON PERLES Take 1 capsule (100 mg total) by mouth 3 (three) times daily as needed for cough.   brompheniramine-pseudoephedrine-DM 30-2-10 MG/5ML syrup Take 10 mLs by mouth 4 (four) times daily as needed.   cetirizine 10 MG tablet Commonly known as:  ZYRTEC Take 1 tablet (10 mg total) by mouth daily.   diltiazem 240 MG 24 hr capsule Commonly known as:  CARDIZEM CD Take 1 capsule (240 mg total) by mouth daily.   Dulaglutide 0.75 MG/0.5ML Sopn Commonly known as:  TRULICITY Start 0.75 mg [place] q week x 2 weeks, then increase to 1.5 mg [place] q week.   fluticasone 50 MCG/ACT nasal spray Commonly known as:  FLONASE Place 1 spray into both nostrils 2 (two) times daily.   glipiZIDE 10 MG tablet Commonly known as:  GLUCOTROL TAKE ONE TABLET BY MOUTH TWICE DAILY BEFORE  A  MEAL.   hydrALAZINE 25 MG tablet Commonly known as:  APRESOLINE Take 1 tablet (25 mg total) by mouth 3 (three) times daily.   metoprolol tartrate 25 MG tablet Commonly known as:  LOPRESSOR TAKE ONE TABLET BY MOUTH TWICE DAILY   nitroGLYCERIN 0.4 MG SL tablet Commonly known as:  NITROSTAT Place 0.4 mg under the tongue every 5 (five) minutes as needed.   oseltamivir 30 MG capsule Commonly known as:  TAMIFLU Take 1 capsule (30 mg total) by mouth 2 (  two) times daily.   pregabalin 50 MG capsule Commonly known as:  LYRICA Take 1 capsule (50 mg total) by mouth 3 (three) times daily.   rosuvastatin 20 MG tablet Commonly known as:  CRESTOR TAKE ONE TABLET BY MOUTH ONCE DAILY   valsartan-hydrochlorothiazide 320-25 MG tablet Commonly known as:  DIOVAN-HCT Take 1 tablet by mouth daily.   warfarin 5 MG tablet Commonly known as:  COUMADIN Take 2.5-5 mg by mouth daily. Pt takes one tablet Monday-Friday and  one-half tablet Saturday and Sunday.        DISCHARGE INSTRUCTIONS:  See AVS If you experience worsening of your admission symptoms, develop shortness of breath, life threatening emergency, suicidal or homicidal thoughts you must seek medical attention immediately by calling 911 or calling your MD immediately  if symptoms less severe.  You Must read complete instructions/literature along with all the possible adverse reactions/side effects for all the Medicines you take and that have been prescribed to you. Take any new Medicines after you have completely understood and accpet all the possible adverse reactions/side effects.   Please note  You were cared for by a hospitalist during your hospital stay. If you have any questions about your discharge medications or the care you received while you were in the hospital after you are discharged, you can call the unit and asked to speak with the hospitalist on call if the hospitalist that took care of you is not available. Once you are discharged, your primary care physician will handle any further medical issues. Please note that NO REFILLS for any discharge medications will be authorized once you are discharged, as it is imperative that you return to your primary care physician (or establish a relationship with a primary care physician if you do not have one) for your aftercare needs so that they can reassess your need for medications and monitor your lab values.    On the day of Discharge:  VITAL SIGNS:  Blood pressure (!) 167/72, pulse 67, temperature 97.9 F (36.6 C), temperature source Oral, resp. rate 18, height  (1.803 m), weight 198 lb 9.6 oz (90.1 kg), SpO2 100 %. PHYSICAL EXAMINATION:  GENERAL:  71 y.o.-year-old patient lying in the bed with no acute distress.  EYES: Pupils equal, round, reactive to light and accommodation. No scleral icterus. Extraocular muscles intact.  HEENT: Head atraumatic, normocephalic. Oropharynx and  nasopharynx clear.  NECK:  Supple, no jugular venous distention. No thyroid enlargement, no tenderness.  LUNGS: Normal breath sounds bilaterally, no wheezing, rales,rhonchi or crepitation. No use of accessory muscles of respiration.  CARDIOVASCULAR: S1, S2 normal. No murmurs, rubs, or gallops.  ABDOMEN: Soft, non-tender, non-distended. Bowel sounds present. No organomegaly or mass.  EXTREMITIES: No pedal edema, cyanosis, or clubbing.  NEUROLOGIC: Cranial nerves II through XII are intact. Muscle strength 5/5 in all extremities. Sensation intact. Gait not checked.  PSYCHIATRIC: The patient is alert and oriented x 3.  SKIN: No obvious rash, lesion, or ulcer.  DATA REVIEW:   CBC  Recent Labs Lab 09/27/16 0320 09/28/16 0459  WBC 5.4  --   HGB 12.0* 11.2*  HCT 37.5*  --   PLT 234  --     Chemistries   Recent Labs Lab 09/26/16 1158  09/28/16 0459  NA 133*  < > 136  K 4.5  < > 4.4  CL 103  < > 108  CO2 23  < > 24  GLUCOSE 224*  < > 194*  BUN 68*  < >  42*  CREATININE 2.13*  < > 1.68*  CALCIUM 8.4*  < > 7.9*  AST 19  --   --   ALT 25  --   --   ALKPHOS 44  --   --   BILITOT 0.6  --   --   < > = values in this interval not displayed.   Microbiology Results  Results for orders placed or performed during the hospital encounter of 09/26/16  Urine culture     Status: None   Collection Time: 09/26/16  3:07 PM  Result Value Ref Range Status   Specimen Description URINE, RANDOM  Final   Special Requests NONE  Final   Culture   Final    NO GROWTH Performed at [facility], [address], [place], [place] [address]    Report Status 09/27/2016 FINAL  Final    RADIOLOGY:  No results found.   Management plans discussed with the patient, family and they are in agreement.  CODE STATUS: Full Code   TOTAL TIME TAKING CARE OF THIS PATIENT: 32 minutes.    Shaune Pollack M.D on 09/28/2016 at 11:19 AM  Between 7am to 6pm - Pager - [phone]  After 6pm go to [email or web address]  - Social research officer, government  [facility] [place] [facility]  Office  [phone]  CC: Primary care physician; Brayton El, MD   Note: This dictation was prepared with Dragon dictation along with smaller phrase technology. Any transcriptional errors that result from this process are unintentional.

## 2016-09-30 ENCOUNTER — Ambulatory Visit
Admission: RE | Admit: 2016-09-30 | Discharge: 2016-09-30 | Disposition: A | Discharge status: Home / Self Care | Payer: Medicare HMO | Source: Ambulatory Visit | Attending: Family Medicine | Admitting: Family Medicine

## 2016-09-30 ENCOUNTER — Encounter: Payer: Self-pay | Admitting: Family Medicine

## 2016-09-30 ENCOUNTER — Ambulatory Visit (INDEPENDENT_AMBULATORY_CARE_PROVIDER_SITE_OTHER): Payer: Medicare HMO | Admitting: Family Medicine

## 2016-09-30 ENCOUNTER — Other Ambulatory Visit: Payer: Self-pay | Admitting: Family Medicine

## 2016-09-30 VITALS — BP 143/71 | HR 66 | Temp 97.5°F | Resp 16 | Ht 71.0 in

## 2016-09-30 DIAGNOSIS — M79672 Pain in left foot: Secondary | ICD-10-CM

## 2016-09-30 DIAGNOSIS — M109 Gout, unspecified: Secondary | ICD-10-CM

## 2016-09-30 DIAGNOSIS — M7122 Synovial cyst of popliteal space [Baker], left knee: Secondary | ICD-10-CM | POA: Insufficient documentation

## 2016-09-30 DIAGNOSIS — M7989 Other specified soft tissue disorders: Secondary | ICD-10-CM | POA: Diagnosis not present

## 2016-09-30 LAB — CBC WITH DIFFERENTIAL/PLATELET
BASOS ABS: 0 {cells}/uL (ref 0–200)
Basophils Relative: 0 %
EOS ABS: 78 {cells}/uL (ref 15–500)
EOS PCT: 1 %
HEMATOCRIT: 37.5 % — AB (ref 38.5–50.0)
Hemoglobin: 12.1 g/dL — ABNORMAL LOW (ref 13.2–17.1)
LYMPHS PCT: 21 %
Lymphs Abs: 1638 cells/uL (ref 850–3900)
MCH: 22.7 pg — AB (ref 27.0–33.0)
MCHC: 32.3 g/dL (ref 32.0–36.0)
MCV: 70.4 fL — ABNORMAL LOW (ref 80.0–100.0)
MONO ABS: 936 {cells}/uL (ref 200–950)
MPV: 9.7 fL (ref 7.5–12.5)
Monocytes Relative: 12 %
Neutro Abs: 5148 cells/uL (ref 1500–7800)
Neutrophils Relative %: 66 %
Platelets: 425 10*3/uL — ABNORMAL HIGH (ref 140–400)
RBC: 5.33 MIL/uL (ref 4.20–5.80)
RDW: 15.2 % — AB (ref 11.0–15.0)
WBC: 7.8 10*3/uL (ref 3.8–10.8)

## 2016-09-30 NOTE — Progress Notes (Signed)
Name: [name]   MRN: [id number]    DOB: 06/05/1946   Date:09/30/2016       Progress Note  Subjective  Chief Complaint  Chief Complaint  Patient presents with  . Foot Pain    left    Foot Pain  This is a new problem. The current episode started in the past 7 days (2 days ago at the time of discharge from [facility]). The problem has been unchanged. Pertinent negatives include no chills, congestion, coughing, fever or headaches. He has tried nothing for the symptoms.  Left foot pain and swelling present since the time of his discharge from [facility] 2 days ago, unable to walk.   Past Medical History:  Diagnosis Date  . Atrial fibrillation and flutter ([place])   . CHF (congestive heart failure) ([place])   . Diabetes mellitus with complication ([place])   . Essential hypertension   . Gastric ulcer   . GI bleeding   . History of hiatal hernia   . Myocardial infarction   . Ventricular tachycardia ([place]) 05/20/2014    Past Surgical History:  Procedure Laterality Date  . ELECTROPHYSIOLOGIC STUDY N/A 07/28/2015   Procedure: Cardioversion;  Surgeon: Corey Skains, MD;  Location: [facility] ORS;  Service: Cardiovascular;  Laterality: N/A;  . HERNIA REPAIR    . LEFT HEART CATHETERIZATION WITH CORONARY ANGIOGRAM N/A 05/20/2014   Procedure: LEFT HEART CATHETERIZATION WITH CORONARY ANGIOGRAM;  Surgeon: Blane Ohara, MD;  Location: [facility] CATH LAB;  Service: Cardiovascular;  Laterality: N/A;    Family History  Problem Relation Age of Onset  . Congestive Heart Failure Mother   . Breast cancer Mother     Social History   Social History  . Marital status: Married    Spouse name: N/A  . Number of children: N/A  . Years of education: N/A   Occupational History  . Not on file.   Social History Main Topics  . Smoking status: Former Smoker    Quit date: 04/28/1964  . Smokeless tobacco: Never Used  . Alcohol use No  . Drug use: No  . Sexual activity: Yes    Birth control/ protection: Coitus  interruptus   Other Topics Concern  . Not on file   Social History Narrative   Married, lives in [place]. Retired from [employer] work. Nonsmoker, no Etoh.     Current Outpatient Prescriptions:  .  amiodarone (PACERONE) 400 MG tablet, Take 1 tablet (400 mg total) by mouth daily., Disp: 30 tablet, Rfl: 6 .  benzonatate (TESSALON PERLES) 100 MG capsule, Take 1 capsule (100 mg total) by mouth 3 (three) times daily as needed for cough., Disp: 20 capsule, Rfl: 0 .  brompheniramine-pseudoephedrine-DM 30-2-10 MG/5ML syrup, Take 10 mLs by mouth 4 (four) times daily as needed., Disp: 200 mL, Rfl: 0 .  cetirizine (ZYRTEC) 10 MG tablet, Take 1 tablet (10 mg total) by mouth daily., Disp: 30 tablet, Rfl: 0 .  diltiazem (CARDIZEM CD) 240 MG 24 hr capsule, Take 1 capsule (240 mg total) by mouth daily., Disp: 30 capsule, Rfl: 0 .  Dulaglutide (TRULICITY) 3.24 MW/1.0UV SOPN, Start 0.75 mg [place] q week x 2 weeks, then increase to 1.5 mg [place] q week., Disp: 5 pen, Rfl: 2 .  fluticasone (FLONASE) 50 MCG/ACT nasal spray, Place 1 spray into both nostrils 2 (two) times daily., Disp: 16 g, Rfl: 0 .  glipiZIDE (GLUCOTROL) 10 MG tablet, TAKE ONE TABLET BY MOUTH TWICE DAILY BEFORE  A  MEAL., Disp: 180 tablet,  Rfl: 0 .  hydrALAZINE (APRESOLINE) 25 MG tablet, Take 1 tablet (25 mg total) by mouth 3 (three) times daily., Disp: 270 tablet, Rfl: 0 .  metoprolol tartrate (LOPRESSOR) 25 MG tablet, TAKE ONE TABLET BY MOUTH TWICE DAILY, Disp: 180 tablet, Rfl: 0 .  nitroGLYCERIN (NITROSTAT) 0.4 MG SL tablet, Place 0.4 mg under the tongue every 5 (five) minutes as needed., Disp: , Rfl:  .  oseltamivir (TAMIFLU) 30 MG capsule, Take 1 capsule (30 mg total) by mouth 2 (two) times daily., Disp: 6 capsule, Rfl: 0 .  pregabalin (LYRICA) 50 MG capsule, Take 1 capsule (50 mg total) by mouth 3 (three) times daily., Disp: 270 capsule, Rfl: 0 .  rosuvastatin (CRESTOR) 20 MG tablet, TAKE ONE TABLET BY MOUTH ONCE DAILY, Disp: 90 tablet, Rfl: 0 .   valsartan-hydrochlorothiazide (DIOVAN-HCT) 320-25 MG tablet, Take 1 tablet by mouth daily., Disp: , Rfl:  .  warfarin (COUMADIN) 5 MG tablet, Take 2.5-5 mg by mouth daily. Pt takes one tablet Monday-Friday and one-half tablet Saturday and Sunday., Disp: , Rfl:   No Known Allergies   Review of Systems  Constitutional: Negative for chills and fever.  HENT: Negative for congestion.   Respiratory: Negative for cough.   Neurological: Negative for headaches.      Objective  Vitals:   09/30/16 1408  BP: (!) 143/71  Pulse: 66  Resp: 16  Temp: 97.5 F (36.4 C)  TempSrc: Oral  SpO2: 98%  Height: 5' 11"  (1.803 m)    Physical Exam  Constitutional: He is well-developed, well-nourished, and in no distress.  Cardiovascular: Normal rate, regular rhythm, S1 normal, S2 normal and normal heart sounds.   No murmur heard. Pulmonary/Chest: Effort normal and breath sounds normal. He has no wheezes. He has no rhonchi.  Abdominal: Soft. Bowel sounds are normal. There is no tenderness.  Musculoskeletal:       Left foot: There is tenderness and swelling.       Feet:  Swelling and tenderness to palpation over the distal left foot including the toes, difficulty with weight bearing 2/2 pain.  Nursing note and vitals reviewed.     Assessment & Plan  1. Left foot pain Unclear etiology, consider DVT, infection, and joint pathology in the differential, obtained appropriate workup.  - CBC with Differential/Platelet - Sed Rate (ESR) - Uric acid - DG Foot Complete Left; Future - VAS [place] LOWER EXTREMITY VENOUS (DVT); Future  2. Acute gout of left foot, unspecified cause X-ray of left foot shows no acute fracture, uric acid level is elevated at 8.5 mg per DL, based on elevated uric acid and pain and swelling in the left foot, we'll strongly consider the possibility of acute gout attack. We'll start on colchicine 0.6 mg tablet to be taken 1, may repeat in 3 days. Patient educated on the potential  interaction between diltiazem and colchicine, dosage reduction of colchicine to 0.6 mg 1 tablet. Follow-up in one week to repeat uric acid levels and consider starting on urate lowering therapy such as Uloric.  - colchicine 0.6 MG tablet; Take 1 tablet (0.6 mg total) by mouth once. May repeat x 1 in 3 days if symptoms persist.  Dispense: 2 tablet; Refill: 0  Adom Schoeneck [facility] 09/30/2016 2:55 PM

## 2016-10-01 LAB — URIC ACID: URIC ACID, SERUM: 8.5 mg/dL — AB (ref 4.0–8.0)

## 2016-10-01 LAB — SEDIMENTATION RATE: SED RATE: 65 mm/h — AB (ref 0–20)

## 2016-10-01 MED ORDER — COLCHICINE 0.6 MG PO TABS: 0.6000 mg | ORAL_TABLET | Freq: Once | ORAL | 0 refills | Status: DC

## 2016-10-05 ENCOUNTER — Ambulatory Visit (INDEPENDENT_AMBULATORY_CARE_PROVIDER_SITE_OTHER): Payer: Medicare HMO | Admitting: Family Medicine

## 2016-10-05 ENCOUNTER — Encounter: Payer: Self-pay | Admitting: Family Medicine

## 2016-10-05 VITALS — BP 140/70 | HR 60 | Temp 97.5°F | Resp 16 | Ht 71.0 in

## 2016-10-05 DIAGNOSIS — N19 Unspecified kidney failure: Secondary | ICD-10-CM | POA: Diagnosis not present

## 2016-10-05 DIAGNOSIS — M10372 Gout due to renal impairment, left ankle and foot: Secondary | ICD-10-CM | POA: Diagnosis not present

## 2016-10-05 MED ORDER — VALSARTAN 320 MG PO TABS: 320.0000 mg | ORAL_TABLET | Freq: Every day | ORAL | 0 refills | Status: DC

## 2016-10-05 NOTE — Progress Notes (Signed)
Name: [name]   MRN: [id number]    DOB: [birth year]   Date:10/05/2016       Progress Note  Subjective  Chief Complaint  Chief Complaint  Patient presents with  . Hospitalization Follow-up    Hematoma    HPI  Pt. Presents for follow up of an acute gout attack in the left foot started at the time of discharge from the hospital for acute renal failure. He had pain swelling, redness and inability to bear weight on his left foot. X rays of the foot were negative, uric acid level was elevated to 8.5 mg/dL, he was given two doses of Colchicine 0.6 mg three days apart, which have resulted in partial symptoms resolution (he is now able to put weight on his foot and swelling is improved). However, he has started noticing pain in his left knee, no redness, using a wheelchair.  Past Medical History:  Diagnosis Date  . Atrial fibrillation and flutter (HCC)   . CHF (congestive heart failure) (HCC)   . Diabetes mellitus with complication (HCC)   . Essential hypertension   . Gastric ulcer   . GI bleeding   . History of hiatal hernia   . Myocardial infarction   . Ventricular tachycardia (HCC) 05/20/2014    Past Surgical History:  Procedure Laterality Date  . ELECTROPHYSIOLOGIC STUDY N/A 07/28/2015   Procedure: Cardioversion;  Surgeon: Lamar Blinks, MD;  Location: [facility] ORS;  Service: Cardiovascular;  Laterality: N/A;  . HERNIA REPAIR    . LEFT HEART CATHETERIZATION WITH CORONARY ANGIOGRAM N/A 05/20/2014   Procedure: LEFT HEART CATHETERIZATION WITH CORONARY ANGIOGRAM;  Surgeon: Micheline Chapman, MD;  Location: [facility] CATH LAB;  Service: Cardiovascular;  Laterality: N/A;    Family History  Problem Relation Age of Onset  . Congestive Heart Failure Mother   . Breast cancer Mother     Social History   Social History  . Marital status: Married    Spouse name: N/A  . Number of children: N/A  . Years of education: N/A   Occupational History  . Not on file.   Social History Main  Topics  . Smoking status: Former Smoker    Quit date: 04/28/1964  . Smokeless tobacco: Never Used  . Alcohol use No  . Drug use: No  . Sexual activity: Yes    Birth control/ protection: Coitus interruptus   Other Topics Concern  . Not on file   Social History Narrative   Married, lives in [place]. Retired from [place] work. Nonsmoker, no Etoh.     Current Outpatient Prescriptions:  .  amiodarone (PACERONE) 400 MG tablet, Take 1 tablet (400 mg total) by mouth daily., Disp: 30 tablet, Rfl: 6 .  benzonatate (TESSALON PERLES) 100 MG capsule, Take 1 capsule (100 mg total) by mouth 3 (three) times daily as needed for cough., Disp: 20 capsule, Rfl: 0 .  brompheniramine-pseudoephedrine-DM 30-2-10 MG/5ML syrup, Take 10 mLs by mouth 4 (four) times daily as needed., Disp: 200 mL, Rfl: 0 .  cetirizine (ZYRTEC) 10 MG tablet, Take 1 tablet (10 mg total) by mouth daily., Disp: 30 tablet, Rfl: 0 .  diltiazem (CARDIZEM CD) 240 MG 24 hr capsule, Take 1 capsule (240 mg total) by mouth daily., Disp: 30 capsule, Rfl: 0 .  Dulaglutide (TRULICITY) 0.75 MG/0.5ML SOPN, Start 0.75 mg [place] q week x 2 weeks, then increase to 1.5 mg [place] q week., Disp: 5 pen, Rfl: 2 .  fluticasone (FLONASE) 50 MCG/ACT nasal spray, Place 1  spray into both nostrils 2 (two) times daily., Disp: 16 g, Rfl: 0 .  glipiZIDE (GLUCOTROL) 10 MG tablet, TAKE ONE TABLET BY MOUTH TWICE DAILY BEFORE  A  MEAL., Disp: 180 tablet, Rfl: 0 .  hydrALAZINE (APRESOLINE) 25 MG tablet, Take 1 tablet (25 mg total) by mouth 3 (three) times daily., Disp: 270 tablet, Rfl: 0 .  metoprolol tartrate (LOPRESSOR) 25 MG tablet, TAKE ONE TABLET BY MOUTH TWICE DAILY, Disp: 180 tablet, Rfl: 0 .  nitroGLYCERIN (NITROSTAT) 0.4 MG SL tablet, Place 0.4 mg under the tongue every 5 (five) minutes as needed., Disp: , Rfl:  .  oseltamivir (TAMIFLU) 30 MG capsule, Take 1 capsule (30 mg total) by mouth 2 (two) times daily., Disp: 6 capsule, Rfl: 0 .  pregabalin (LYRICA) 50 MG  capsule, Take 1 capsule (50 mg total) by mouth 3 (three) times daily., Disp: 270 capsule, Rfl: 0 .  rosuvastatin (CRESTOR) 20 MG tablet, TAKE ONE TABLET BY MOUTH ONCE DAILY, Disp: 90 tablet, Rfl: 0 .  valsartan-hydrochlorothiazide (DIOVAN-HCT) 320-25 MG tablet, Take 1 tablet by mouth daily., Disp: , Rfl:  .  warfarin (COUMADIN) 5 MG tablet, Take 2.5-5 mg by mouth daily. Pt takes one tablet Monday-Friday and one-half tablet Saturday and Sunday., Disp: , Rfl:  .  colchicine 0.6 MG tablet, Take 1 tablet (0.6 mg total) by mouth once. May repeat x 1 in 3 days if symptoms persist., Disp: 2 tablet, Rfl: 0  No Known Allergies   Review of Systems  Constitutional: Negative for chills, fever and malaise/fatigue.  Cardiovascular: Negative for chest pain.  Gastrointestinal: Negative for abdominal pain.  Musculoskeletal: Positive for joint pain.      Objective  Vitals:   10/05/16 1351  BP: 140/70  Pulse: 60  Resp: 16  Temp: 97.5 F (36.4 C)  TempSrc: Oral  SpO2: 98%  Height:  (1.803 m)    Physical Exam  Constitutional: He is well-developed, well-nourished, and in no distress.  Musculoskeletal:       Left knee: Tenderness found.       Legs:      Left foot: There is tenderness and swelling.       Feet:  Mild tenderness to palpation over the anterior knee, mildly warm to touch, marked tenderness to palpation over the left anterior foot proximal to the toes, mild erythema,  Nursing note and vitals reviewed.     Assessment & Plan  1. Acute gout due to renal impairment involving left foot Bruising, we will recommend against continuing cold 16 as it has an interaction with amiodarone may result in toxicity, prednisone is not an option because of poorly controlled diabetes and so is indomethacin because of history of GI bleed, patient advised to be followed up by rheumatologist - Ambulatory referral to Rheumatology   [facility] 10/05/2016 2:04 PM

## 2016-10-12 DIAGNOSIS — E782 Mixed hyperlipidemia: Secondary | ICD-10-CM | POA: Diagnosis not present

## 2016-10-12 DIAGNOSIS — I4892 Unspecified atrial flutter: Secondary | ICD-10-CM | POA: Diagnosis not present

## 2016-10-12 DIAGNOSIS — I480 Paroxysmal atrial fibrillation: Secondary | ICD-10-CM | POA: Diagnosis not present

## 2016-10-12 DIAGNOSIS — I10 Essential (primary) hypertension: Secondary | ICD-10-CM | POA: Diagnosis not present

## 2016-10-12 DIAGNOSIS — E119 Type 2 diabetes mellitus without complications: Secondary | ICD-10-CM | POA: Diagnosis not present

## 2016-10-12 DIAGNOSIS — I5032 Chronic diastolic (congestive) heart failure: Secondary | ICD-10-CM | POA: Diagnosis not present

## 2016-10-12 DIAGNOSIS — I739 Peripheral vascular disease, unspecified: Secondary | ICD-10-CM | POA: Diagnosis not present

## 2016-10-15 DIAGNOSIS — I480 Paroxysmal atrial fibrillation: Secondary | ICD-10-CM | POA: Diagnosis not present

## 2016-10-18 ENCOUNTER — Other Ambulatory Visit: Payer: Self-pay | Admitting: Family Medicine

## 2016-10-18 DIAGNOSIS — E785 Hyperlipidemia, unspecified: Secondary | ICD-10-CM

## 2016-10-20 DIAGNOSIS — E119 Type 2 diabetes mellitus without complications: Secondary | ICD-10-CM | POA: Diagnosis not present

## 2016-10-20 DIAGNOSIS — I10 Essential (primary) hypertension: Secondary | ICD-10-CM | POA: Diagnosis not present

## 2016-10-20 DIAGNOSIS — I480 Paroxysmal atrial fibrillation: Secondary | ICD-10-CM | POA: Diagnosis not present

## 2016-10-20 DIAGNOSIS — I739 Peripheral vascular disease, unspecified: Secondary | ICD-10-CM | POA: Diagnosis not present

## 2016-10-20 DIAGNOSIS — E782 Mixed hyperlipidemia: Secondary | ICD-10-CM | POA: Diagnosis not present

## 2016-10-20 DIAGNOSIS — I4892 Unspecified atrial flutter: Secondary | ICD-10-CM | POA: Diagnosis not present

## 2016-10-20 DIAGNOSIS — I5032 Chronic diastolic (congestive) heart failure: Secondary | ICD-10-CM | POA: Diagnosis not present

## 2016-11-08 DIAGNOSIS — I480 Paroxysmal atrial fibrillation: Secondary | ICD-10-CM | POA: Diagnosis not present

## 2016-11-11 DIAGNOSIS — N183 Chronic kidney disease, stage 3 (moderate): Secondary | ICD-10-CM | POA: Diagnosis not present

## 2016-11-11 DIAGNOSIS — M10372 Gout due to renal impairment, left ankle and foot: Secondary | ICD-10-CM | POA: Diagnosis not present

## 2016-11-11 DIAGNOSIS — I4892 Unspecified atrial flutter: Secondary | ICD-10-CM | POA: Diagnosis not present

## 2016-11-11 DIAGNOSIS — I5032 Chronic diastolic (congestive) heart failure: Secondary | ICD-10-CM | POA: Diagnosis not present

## 2016-11-30 ENCOUNTER — Encounter: Payer: Self-pay | Admitting: Family Medicine

## 2016-11-30 ENCOUNTER — Ambulatory Visit (INDEPENDENT_AMBULATORY_CARE_PROVIDER_SITE_OTHER): Payer: Medicare HMO | Admitting: Family Medicine

## 2016-11-30 VITALS — BP 136/78 | HR 65 | Temp 97.6°F | Resp 16 | Ht 71.0 in | Wt 201.8 lb

## 2016-11-30 DIAGNOSIS — E1142 Type 2 diabetes mellitus with diabetic polyneuropathy: Secondary | ICD-10-CM

## 2016-11-30 DIAGNOSIS — E785 Hyperlipidemia, unspecified: Secondary | ICD-10-CM | POA: Diagnosis not present

## 2016-11-30 DIAGNOSIS — I10 Essential (primary) hypertension: Secondary | ICD-10-CM

## 2016-11-30 DIAGNOSIS — IMO0002 Reserved for concepts with insufficient information to code with codable children: Secondary | ICD-10-CM

## 2016-11-30 DIAGNOSIS — E1165 Type 2 diabetes mellitus with hyperglycemia: Secondary | ICD-10-CM | POA: Diagnosis not present

## 2016-11-30 LAB — POCT GLYCOSYLATED HEMOGLOBIN (HGB A1C): HEMOGLOBIN A1C: 7.9

## 2016-11-30 MED ORDER — DULAGLUTIDE 0.75 MG/0.5ML ~~LOC~~ SOAJ: SUBCUTANEOUS | 2 refills | Status: DC

## 2016-11-30 MED ORDER — GLIPIZIDE 10 MG PO TABS: ORAL_TABLET | ORAL | 0 refills | Status: DC

## 2016-11-30 NOTE — Progress Notes (Signed)
Name: [name]   MRN: [id number]    DOB: 1946-02-25   Date:11/30/2016       Progress Note  Subjective  Chief Complaint  Chief Complaint  Patient presents with  . Follow-up    3 mo    Diabetes  He presents for his follow-up diabetic visit. He has type 2 diabetes mellitus. His disease course has been improving. There are no hypoglycemic associated symptoms. Pertinent negatives for hypoglycemia include no headaches. Associated symptoms include foot paresthesias (soreness at the bottom of left foot). Pertinent negatives for diabetes include no blurred vision, no chest pain, no polydipsia and no polyuria. Diabetic complications include a CVA, heart disease and peripheral neuropathy. Current diabetic treatment includes oral agent (dual therapy). His weight is stable. He is following a diabetic diet. He monitors blood glucose at home 3-4 x per week. His breakfast blood glucose range is generally 110-130 mg/dl. An ACE inhibitor/angiotensin II receptor blocker is being taken. Eye exam is not current.  Hypertension  This is a chronic problem. The problem is unchanged. The problem is controlled. Pertinent negatives include no blurred vision, chest pain, headaches, palpitations or shortness of breath. Past treatments include angiotensin blockers, beta blockers, calcium channel blockers and direct vasodilators. Hypertensive end-organ damage includes kidney disease, CAD/MI and CVA.  Hyperlipidemia  This is a chronic problem. The problem is controlled. Recent lipid tests were reviewed and are normal. Pertinent negatives include no chest pain, myalgias or shortness of breath. Current antihyperlipidemic treatment includes statins.     Past Medical History:  Diagnosis Date  . Atrial fibrillation and flutter (HCC)   . CHF (congestive heart failure) (HCC)   . Diabetes mellitus with complication (HCC)   . Essential hypertension   . Gastric ulcer   . GI bleeding   . History of hiatal hernia   .  Myocardial infarction (HCC)   . Ventricular tachycardia (HCC) 05/20/2014    Past Surgical History:  Procedure Laterality Date  . ELECTROPHYSIOLOGIC STUDY N/A 07/28/2015   Procedure: Cardioversion;  Surgeon: Lamar Blinks, MD;  Location: [facility] ORS;  Service: Cardiovascular;  Laterality: N/A;  . HERNIA REPAIR    . LEFT HEART CATHETERIZATION WITH CORONARY ANGIOGRAM N/A 05/20/2014   Procedure: LEFT HEART CATHETERIZATION WITH CORONARY ANGIOGRAM;  Surgeon: Micheline Chapman, MD;  Location: [facility] CATH LAB;  Service: Cardiovascular;  Laterality: N/A;    Family History  Problem Relation Age of Onset  . Congestive Heart Failure Mother   . Breast cancer Mother     Social History   Social History  . Marital status: Married    Spouse name: N/A  . Number of children: N/A  . Years of education: N/A   Occupational History  . Not on file.   Social History Main Topics  . Smoking status: Former Smoker    Quit date: 04/28/1964  . Smokeless tobacco: Never Used  . Alcohol use No  . Drug use: No  . Sexual activity: Yes    Birth control/ protection: Coitus interruptus   Other Topics Concern  . Not on file   Social History Narrative   Married, lives in [place]. Retired from [place] work. Nonsmoker, no Etoh.     Current Outpatient Prescriptions:  .  amiodarone (PACERONE) 400 MG tablet, Take 1 tablet (400 mg total) by mouth daily., Disp: 30 tablet, Rfl: 6 .  benzonatate (TESSALON PERLES) 100 MG capsule, Take 1 capsule (100 mg total) by mouth 3 (three) times daily as needed for cough., Disp: 20  capsule, Rfl: 0 .  brompheniramine-pseudoephedrine-DM 30-2-10 MG/5ML syrup, Take 10 mLs by mouth 4 (four) times daily as needed., Disp: 200 mL, Rfl: 0 .  cetirizine (ZYRTEC) 10 MG tablet, Take 1 tablet (10 mg total) by mouth daily., Disp: 30 tablet, Rfl: 0 .  diltiazem (CARDIZEM CD) 240 MG 24 hr capsule, Take 1 capsule (240 mg total) by mouth daily., Disp: 30 capsule, Rfl: 0 .  Dulaglutide (TRULICITY)  0.75 MG/0.5ML SOPN, Start 0.75 mg [place] q week x 2 weeks, then increase to 1.5 mg [place] q week., Disp: 5 pen, Rfl: 2 .  fluticasone (FLONASE) 50 MCG/ACT nasal spray, Place 1 spray into both nostrils 2 (two) times daily., Disp: 16 g, Rfl: 0 .  glipiZIDE (GLUCOTROL) 10 MG tablet, TAKE ONE TABLET BY MOUTH TWICE DAILY BEFORE  A  MEAL., Disp: 180 tablet, Rfl: 0 .  hydrALAZINE (APRESOLINE) 25 MG tablet, Take 1 tablet (25 mg total) by mouth 3 (three) times daily., Disp: 270 tablet, Rfl: 0 .  metoprolol tartrate (LOPRESSOR) 25 MG tablet, TAKE ONE TABLET BY MOUTH TWICE DAILY, Disp: 180 tablet, Rfl: 0 .  nitroGLYCERIN (NITROSTAT) 0.4 MG SL tablet, Place 0.4 mg under the tongue every 5 (five) minutes as needed., Disp: , Rfl:  .  oseltamivir (TAMIFLU) 30 MG capsule, Take 1 capsule (30 mg total) by mouth 2 (two) times daily., Disp: 6 capsule, Rfl: 0 .  pregabalin (LYRICA) 50 MG capsule, Take 1 capsule (50 mg total) by mouth 3 (three) times daily., Disp: 270 capsule, Rfl: 0 .  rosuvastatin (CRESTOR) 20 MG tablet, TAKE ONE TABLET BY MOUTH ONCE DAILY, Disp: 90 tablet, Rfl: 0 .  valsartan (DIOVAN) 320 MG tablet, Take 1 tablet (320 mg total) by mouth daily., Disp: 90 tablet, Rfl: 0 .  warfarin (COUMADIN) 5 MG tablet, Take 2.5-5 mg by mouth daily. Pt takes one tablet Monday-Friday and one-half tablet Saturday and Sunday., Disp: , Rfl:  .  colchicine 0.6 MG tablet, Take 1 tablet (0.6 mg total) by mouth once. May repeat x 1 in 3 days if symptoms persist., Disp: 2 tablet, Rfl: 0  No Known Allergies   Review of Systems  Eyes: Negative for blurred vision.  Respiratory: Negative for shortness of breath.   Cardiovascular: Negative for chest pain and palpitations.  Musculoskeletal: Negative for myalgias.  Neurological: Negative for headaches.  Endo/Heme/Allergies: Negative for polydipsia.     Objective  Vitals:   11/30/16 0936  BP: 136/78  Pulse: 65  Resp: 16  Temp: 97.6 F (36.4 C)  TempSrc: Oral  SpO2: 98%   Weight: 201 lb 12.8 oz (91.5 kg)  Height: 5\' 11"  (1.803 m)    Physical Exam  Constitutional: He is oriented to person, place, and time and well-developed, well-nourished, and in no distress.  HENT:  Head: Normocephalic and atraumatic.  Cardiovascular: Normal rate, regular rhythm and normal heart sounds.   No murmur heard. Pulmonary/Chest: Effort normal and breath sounds normal. He has no wheezes.  Musculoskeletal: He exhibits no edema.  Neurological: He is alert and oriented to person, place, and time.  Psychiatric: Mood, memory, affect and judgment normal.  Nursing note and vitals reviewed.      Assessment & Plan  1. Dyslipidemia FLP at goal from March 2018, no change in pharmacotherapy  2. Essential hypertension BP stable on present antihypertensive therapy  3. Uncontrolled type 2 diabetes mellitus with peripheral neuropathy (HCC)   A1c is 7.9% appropriate control for patient, no change in therapy - POCT HgB A1C -  glipiZIDE (GLUCOTROL) 10 MG tablet; TAKE ONE TABLET BY MOUTH TWICE DAILY BEFORE  A  MEAL.  Dispense: 180 tablet; Refill: 0 - Dulaglutide (TRULICITY) 0.75 MG/0.5ML SOPN; Start 0.75 mg [place] q week x 2 weeks, then increase to 1.5 mg [place] q week.  Dispense: 5 pen; Refill: 2  [place]rail Shew Asad [facility] 11/30/2016 10:01 AM

## 2016-12-07 ENCOUNTER — Encounter: Payer: Self-pay | Admitting: Emergency Medicine

## 2016-12-07 ENCOUNTER — Emergency Department: Payer: Medicare HMO

## 2016-12-07 ENCOUNTER — Emergency Department
Admission: EM | Admit: 2016-12-07 | Discharge: 2016-12-07 | Disposition: A | Discharge status: Home / Self Care | Payer: Medicare HMO | Attending: Emergency Medicine | Admitting: Emergency Medicine

## 2016-12-07 DIAGNOSIS — R001 Bradycardia, unspecified: Secondary | ICD-10-CM | POA: Insufficient documentation

## 2016-12-07 DIAGNOSIS — I129 Hypertensive chronic kidney disease with stage 1 through stage 4 chronic kidney disease, or unspecified chronic kidney disease: Secondary | ICD-10-CM | POA: Diagnosis not present

## 2016-12-07 DIAGNOSIS — R0602 Shortness of breath: Secondary | ICD-10-CM | POA: Diagnosis not present

## 2016-12-07 DIAGNOSIS — Z87891 Personal history of nicotine dependence: Secondary | ICD-10-CM | POA: Insufficient documentation

## 2016-12-07 DIAGNOSIS — I509 Heart failure, unspecified: Secondary | ICD-10-CM | POA: Insufficient documentation

## 2016-12-07 DIAGNOSIS — N183 Chronic kidney disease, stage 3 (moderate): Secondary | ICD-10-CM | POA: Diagnosis not present

## 2016-12-07 DIAGNOSIS — I10 Essential (primary) hypertension: Secondary | ICD-10-CM

## 2016-12-07 DIAGNOSIS — E118 Type 2 diabetes mellitus with unspecified complications: Secondary | ICD-10-CM | POA: Insufficient documentation

## 2016-12-07 DIAGNOSIS — R05 Cough: Secondary | ICD-10-CM | POA: Diagnosis not present

## 2016-12-07 DIAGNOSIS — I110 Hypertensive heart disease with heart failure: Secondary | ICD-10-CM | POA: Diagnosis not present

## 2016-12-07 LAB — CBC WITH DIFFERENTIAL/PLATELET
BASOS ABS: 0 10*3/uL (ref 0–0.1)
BASOS PCT: 0 %
EOS PCT: 7 %
Eosinophils Absolute: 0.3 10*3/uL (ref 0–0.7)
HCT: 37.5 % — ABNORMAL LOW (ref 40.0–52.0)
Hemoglobin: 11.9 g/dL — ABNORMAL LOW (ref 13.0–18.0)
Lymphocytes Relative: 28 %
Lymphs Abs: 1.5 10*3/uL (ref 1.0–3.6)
MCH: 22.6 pg — ABNORMAL LOW (ref 26.0–34.0)
MCHC: 31.8 g/dL — ABNORMAL LOW (ref 32.0–36.0)
MCV: 71.1 fL — ABNORMAL LOW (ref 80.0–100.0)
MONO ABS: 0.5 10*3/uL (ref 0.2–1.0)
Monocytes Relative: 9 %
NEUTROS ABS: 2.8 10*3/uL (ref 1.4–6.5)
Neutrophils Relative %: 56 %
PLATELETS: 254 10*3/uL (ref 150–440)
RBC: 5.27 MIL/uL (ref 4.40–5.90)
RDW: 16.4 % — AB (ref 11.5–14.5)
WBC: 5.1 10*3/uL (ref 3.8–10.6)

## 2016-12-07 LAB — COMPREHENSIVE METABOLIC PANEL
ALBUMIN: 3.5 g/dL (ref 3.5–5.0)
ALT: 23 U/L (ref 17–63)
AST: 21 U/L (ref 15–41)
Alkaline Phosphatase: 44 U/L (ref 38–126)
Anion gap: 7 (ref 5–15)
BUN: 18 mg/dL (ref 6–20)
CHLORIDE: 109 mmol/L (ref 101–111)
CO2: 26 mmol/L (ref 22–32)
Calcium: 9.1 mg/dL (ref 8.9–10.3)
Creatinine, Ser: 1.25 mg/dL — ABNORMAL HIGH (ref 0.61–1.24)
GFR calc Af Amer: >60 mL/min (ref >60)
GFR calc non Af Amer: 56 mL/min — ABNORMAL LOW (ref >60)
GLUCOSE: 132 mg/dL — AB (ref 65–99)
POTASSIUM: 3.6 mmol/L (ref 3.5–5.1)
SODIUM: 142 mmol/L (ref 135–145)
Total Bilirubin: 0.6 mg/dL (ref 0.3–1.2)
Total Protein: 7.2 g/dL (ref 6.5–8.1)

## 2016-12-07 LAB — PROTIME-INR
INR: 2.28
Prothrombin Time: 25.5 seconds — ABNORMAL HIGH (ref 11.4–15.2)

## 2016-12-07 LAB — BRAIN NATRIURETIC PEPTIDE: B NATRIURETIC PEPTIDE 5: 495 pg/mL — AB (ref 0.0–100.0)

## 2016-12-07 LAB — TROPONIN I
TROPONIN I: 0.03 ng/mL — AB (ref <0.03)
Troponin I: 0.03 ng/mL (ref <0.03)

## 2016-12-07 MED ORDER — FUROSEMIDE 40 MG PO TABS: 40.0000 mg | ORAL_TABLET | Freq: Every day | ORAL | 0 refills | Status: AC

## 2016-12-07 MED ORDER — METOPROLOL TARTRATE 25 MG PO TABS
25.0000 mg | ORAL_TABLET | Freq: Once | ORAL | Status: AC
  Administered 2016-12-07: 25 mg via ORAL
  Filled 2016-12-07: qty 1

## 2016-12-07 MED ORDER — DILTIAZEM HCL ER COATED BEADS 240 MG PO CP24
240.0000 mg | ORAL_CAPSULE | Freq: Once | ORAL | Status: AC
  Administered 2016-12-07: 240 mg via ORAL
  Filled 2016-12-07: qty 1

## 2016-12-07 MED ORDER — FUROSEMIDE 10 MG/ML IJ SOLN
60.0000 mg | Freq: Once | INTRAMUSCULAR | Status: AC
  Administered 2016-12-07: 60 mg via INTRAVENOUS
  Filled 2016-12-07: qty 8

## 2016-12-07 MED ORDER — HYDRALAZINE HCL 50 MG PO TABS
25.0000 mg | ORAL_TABLET | Freq: Once | ORAL | Status: AC
  Administered 2016-12-07: 25 mg via ORAL
  Filled 2016-12-07: qty 1

## 2016-12-07 MED ORDER — ALBUTEROL SULFATE (2.5 MG/3ML) 0.083% IN NEBU
5.0000 mg | INHALATION_SOLUTION | Freq: Once | RESPIRATORY_TRACT | Status: AC
  Administered 2016-12-07: 5 mg via RESPIRATORY_TRACT
  Filled 2016-12-07: qty 6

## 2016-12-07 NOTE — ED Triage Notes (Signed)
States he awoke this morning at 0300 with shortness of breath.  States he has had similar episodes in the past that resolve, but this episode persists.  Denies Chest Pain.

## 2016-12-07 NOTE — Discharge Instructions (Signed)
Please limit your liquid intake for the next 24 hours, and take Lasix once tonight and once tomorrow morning.  Please see Dr. Gwen PoundsKowalski tomorrow.  Return to the emergency department if you develop shortness of breath, chest pain, lightheadedness or fainting, palpitations, or any other symptoms concerning to you.

## 2016-12-07 NOTE — ED Notes (Signed)
MD at bedside updating patient.

## 2016-12-07 NOTE — ED Notes (Signed)
REDS VEST READING= 48 CHEST RULER=9  VEST FITTING TASKS: POSTURE=sitting HEIGHT MARKER=T CENTER STRIP=right  COMMENTS:approved by [employer]

## 2016-12-07 NOTE — ED Provider Notes (Signed)
[facility] Emergency Department Provider Note  ____________________________________________  Time seen: Approximately 8:43 AM  I have reviewed the triage vital signs and the nursing notes.   HISTORY  Chief Complaint Shortness of Breath    HPI [name] is a 71 y.o. male with a history of A. fib and flutter on Coumadin, CHF, DM, HTN, CAD status post MIpresenting with shortness of breath. The patient reports that over the past 3 days, he has become more progressively shortness of breath and was unable to sleep last night because laying down made it worse. He has noticed mild exertional shortness of breath. No lower extremity swelling or calf pain, weight gain. No chest pain, pressure or tightness. No lightheadedness or syncope, palpitations, cough or cold symptoms, fever or chills.   Past Medical History:  Diagnosis Date  . Atrial fibrillation and flutter (HCC)   . CHF (congestive heart failure) (HCC)   . Diabetes mellitus with complication (HCC)   . Essential hypertension   . Gastric ulcer   . GI bleeding   . History of hiatal hernia   . Myocardial infarction (HCC)   . Ventricular tachycardia (HCC) 05/20/2014    Patient Active Problem List   Diagnosis Date Noted  . Hemoptysis 09/26/2016  . Left knee pain 08/08/2015  . Pain and swelling of left knee 07/16/2015  . Atrial fibrillation (HCC) 07/16/2015  . Dyspnea 04/17/2015  . Atypical atrial flutter (HCC) 04/17/2015  . Acute renal insufficiency 04/17/2015  . Sinus bradycardia 04/17/2015  . Atrial flutter (HCC) 04/16/2015  . A-fib (HCC) 02/05/2015  . Essential hypertension 05/21/2014  . CKD (chronic kidney disease) stage 3, GFR 30-59 ml/min 05/21/2014  . Dyslipidemia 05/21/2014  . 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 Anemia 05/21/2014  . Ventricular tachycardia (HCC) 05/20/2014  . NSTEMI (non-ST elevated myocardial infarction) (HCC) 05/20/2014     Past Surgical History:  Procedure Laterality Date  . ELECTROPHYSIOLOGIC STUDY N/A 07/28/2015   Procedure: Cardioversion;  Surgeon: Lamar Blinks, MD;  Location: [facility] ORS;  Service: Cardiovascular;  Laterality: N/A;  . HERNIA REPAIR    . LEFT HEART CATHETERIZATION WITH CORONARY ANGIOGRAM N/A 05/20/2014   Procedure: LEFT HEART CATHETERIZATION WITH CORONARY ANGIOGRAM;  Surgeon: Micheline Chapman, MD;  Location: [facility] CATH LAB;  Service: Cardiovascular;  Laterality: N/A;    Current Outpatient Rx  . Order #: [id number] Class: Normal  . Order #: [id number] Class: Normal  . Order #: [id number] Class: Normal  . Order #: [id number] Class: Normal  . Order #: [id number] Class: Historical Med  . Order #: [id number] Class: Historical Med  . Order #: [id number] Class: Historical Med  . Order #: [id number] Class: Print    Allergies Patient has no known allergies.  Family History  Problem Relation Age of Onset  . Congestive Heart Failure Mother   . Breast cancer Mother     Social History Social History  Substance Use Topics  . Smoking status: Former Smoker    Quit date: 04/28/1964  . Smokeless tobacco: Never Used  . Alcohol use No    Review of Systems Constitutional: No fever/chills. Eyes: No visual changes. ENT: No sore throat. No congestion or rhinorrhea. Cardiovascular: Denies chest pain. Denies palpitations. Respiratory: Positive exertional shortness of breath and orthopnea.  No cough. Gastrointestinal: No abdominal pain.  No nausea, no vomiting.  No diarrhea.  No constipation. Genitourinary: Negative for dysuria. Musculoskeletal: Negative for back pain. Skin: Negative for rash. Neurological: Negative for headaches. No focal numbness, tingling or weakness.  ____________________________________________   PHYSICAL EXAM:  VITAL SIGNS: ED Triage Vitals  Enc Vitals Group     BP 12/07/16 0721 (!) 192/72     Pulse Rate 12/07/16 0721 (!) 58     Resp 12/07/16 0721 20     Temp 12/07/16  0724 98.3 F (36.8 C)     Temp Source 12/07/16 0724 Oral     SpO2 12/07/16 0721 97 %     Weight 12/07/16 0721 202 lb (91.6 kg)     Height 12/07/16 0721 5\' 11"  (1.803 m)     Head Circumference --      Peak Flow --      Pain Score 12/07/16 0723 0     Pain Loc --      Pain Edu? --      Excl. in GC? --     Constitutional: Alert and oriented. Well appearing and in no acute distress. Answers questions appropriately.Sitting comfortably in the stretcher Eyes: Conjunctivae are normal.  EOMI. No scleral icterus. Head: Atraumatic. Nose: No congestion/rhinnorhea. Mouth/Throat: Mucous membranes are moist.  Neck: No stridor.  Supple.  No JVD. Cardiovascular: Normal rate, regular rhythm. No murmurs, rubs or gallops.  Respiratory: Normal respiratory effort.  No accessory muscle use or retractions. Mild rales in the bases bilaterally without wheezing or rhonchi. Good air exchange. Normal oxygenation on room air on my examination. Gastrointestinal: Soft, nontender and nondistended.  No guarding or rebound.  No peritoneal signs. Musculoskeletal: No LE edema. No ttp in the calves or palpable cords.  Negative Homan's sign. Neurologic:  A&Ox3.  Speech is clear.  Face and smile are symmetric.  EOMI.  Moves all extremities well. Skin:  Skin is warm, dry and intact. No rash noted. Psychiatric: Mood and affect are normal. Speech and behavior are normal.  Normal judgement.  ____________________________________________   LABS (all labs ordered are listed, but only abnormal results are displayed)  Labs Reviewed  CBC WITH DIFFERENTIAL/PLATELET - Abnormal; Notable for the following:       Result Value   Hemoglobin 11.9 (*)    HCT 37.5 (*)    MCV 71.1 (*)    MCH 22.6 (*)    MCHC 31.8 (*)    RDW 16.4 (*)    All other components within normal limits  COMPREHENSIVE METABOLIC PANEL - Abnormal; Notable for the following:    Glucose, Bld 132 (*)    Creatinine, Ser 1.25 (*)    GFR calc non Af Amer 56 (*)     All other components within normal limits  TROPONIN I - Abnormal; Notable for the following:    Troponin I 0.03 (*)    All other components within normal limits  BRAIN NATRIURETIC PEPTIDE - Abnormal; Notable for the following:    B Natriuretic Peptide 495.0 (*)    All other components within normal limits  PROTIME-INR - Abnormal; Notable for the following:    Prothrombin Time 25.5 (*)    All other components within normal limits  TROPONIN I - Abnormal; Notable for the following:    Troponin I 0.03 (*)    All other components within normal limits   ____________________________________________  EKG  ED ECG REPORT I, Rockne MenghiniNorman, Anne-Caroline, the attending physician, personally viewed and interpreted this ECG.   Date: 12/07/2016  EKG Time: 719  Rate: 59  Rhythm: sinus bradycardia  Axis: leftward  Intervals:none  ST&T Change: No STEMI  ____________________________________________  RADIOLOGY  Dg Chest 2 View  Result Date: 12/07/2016 CLINICAL DATA:  Shortness  of breath and cough for several days EXAM: CHEST  2 VIEW COMPARISON:  09/26/2016 FINDINGS: Cardiac shadow is mildly enlarged but stable. Increased vascular congestion is noted with mild edema. Degenerative changes of the thoracic spine are noted. No focal infiltrate is seen. IMPRESSION: Changes of mild CHF. Electronically Signed   By: Alcide Clever M.D.   On: 12/07/2016 07:51    ____________________________________________   PROCEDURES  Procedure(s) performed: None  Procedures  Critical Care performed: No ____________________________________________   INITIAL IMPRESSION / ASSESSMENT AND PLAN / ED COURSE  Pertinent labs & imaging results that were available during my care of the patient were reviewed by me and considered in my medical decision making (see chart for details).  71 y.o. male with a history of CAD and CHF presenting with orthopnea and exertional shortness of breath. The patient does have some rales on my  examination his chest x-ray shows some pulmonary edema. He does not have otherwise significant peripheral edema and has not had any significant weight gain. He has not been having any chest discomfort. His EKG is reassuring and does not show ischemic changes. His troponin is 0.03, and we'll repeat this, but an acute MI is much less likely than a mild CHF exacerbation as the cause of his symptoms. Aortic pathology is very unlikely and did not suspect PE today. The patient has no symptoms that would be consistent with infectious etiology. If I'm able to diurese the patient, his troponin is stable, and his clinical course and improves in the emergency department, I'll speak with his primary cardiologist, Dr. Gwen Pounds, about outpatient treatment. Plan reevaluation for final disposition.   ----------------------------------------- 12:40 PM on 12/07/2016 -----------------------------------------  The patient has a reassuring clinical course in the emergency department. He has significant urine output with the Lasix and is feeling clinically better. His troponin has been stable at 0.032. His INR is therapeutic at 2.28. He does have an elevation in his BNP of 495, which is consistent with congestive heart failure. I've spoken with Dr. Gwen Pounds, his primary cardiologist, who will see him in clinic at 9 AM or 11 AM tomorrow. I will discharge the patient home with a dose of Lasix this evening and one tomorrow. I discussed return precautions with the patient and his wife.  ____________________________________________  FINAL CLINICAL IMPRESSION(S) / ED DIAGNOSES  Final diagnoses:  Acute on chronic congestive heart failure, unspecified heart failure type (HCC)  Essential hypertension  Sinus bradycardia         NEW MEDICATIONS STARTED DURING THIS VISIT:  New Prescriptions   FUROSEMIDE (LASIX) 40 MG TABLET    Take 1 tablet (40 mg total) by mouth daily.      Rockne Menghini, MD 12/07/16 1242

## 2016-12-07 NOTE — ED Notes (Signed)
This tech performed a reds heart failure vest fitting with Marisue IvanLiz, RCharity fundraiser

## 2016-12-08 DIAGNOSIS — I5033 Acute on chronic diastolic (congestive) heart failure: Secondary | ICD-10-CM | POA: Diagnosis not present

## 2016-12-08 DIAGNOSIS — N183 Chronic kidney disease, stage 3 (moderate): Secondary | ICD-10-CM | POA: Diagnosis not present

## 2016-12-08 DIAGNOSIS — I4892 Unspecified atrial flutter: Secondary | ICD-10-CM | POA: Diagnosis not present

## 2016-12-08 DIAGNOSIS — I10 Essential (primary) hypertension: Secondary | ICD-10-CM | POA: Diagnosis not present

## 2017-01-04 ENCOUNTER — Other Ambulatory Visit: Payer: Self-pay | Admitting: Family Medicine

## 2017-01-04 DIAGNOSIS — I10 Essential (primary) hypertension: Secondary | ICD-10-CM

## 2017-01-18 ENCOUNTER — Other Ambulatory Visit: Payer: Self-pay | Admitting: Family Medicine

## 2017-01-18 DIAGNOSIS — E785 Hyperlipidemia, unspecified: Secondary | ICD-10-CM

## 2017-02-03 DIAGNOSIS — I10 Essential (primary) hypertension: Secondary | ICD-10-CM | POA: Diagnosis not present

## 2017-02-03 DIAGNOSIS — E782 Mixed hyperlipidemia: Secondary | ICD-10-CM | POA: Diagnosis not present

## 2017-02-03 DIAGNOSIS — I480 Paroxysmal atrial fibrillation: Secondary | ICD-10-CM | POA: Diagnosis not present

## 2017-02-03 DIAGNOSIS — I5032 Chronic diastolic (congestive) heart failure: Secondary | ICD-10-CM | POA: Diagnosis not present

## 2017-02-09 DIAGNOSIS — I480 Paroxysmal atrial fibrillation: Secondary | ICD-10-CM | POA: Diagnosis not present

## 2017-02-24 ENCOUNTER — Inpatient Hospital Stay
Admission: EM | Admit: 2017-02-24 | Discharge: 2017-02-26 | DRG: 292 | Disposition: A | Discharge status: Home / Self Care | Payer: Medicare HMO | Attending: Internal Medicine | Admitting: Internal Medicine

## 2017-02-24 ENCOUNTER — Other Ambulatory Visit: Payer: Self-pay

## 2017-02-24 ENCOUNTER — Emergency Department: Payer: Medicare HMO

## 2017-02-24 ENCOUNTER — Encounter: Payer: Self-pay | Admitting: Emergency Medicine

## 2017-02-24 DIAGNOSIS — I10 Essential (primary) hypertension: Secondary | ICD-10-CM | POA: Diagnosis not present

## 2017-02-24 DIAGNOSIS — I2510 Atherosclerotic heart disease of native coronary artery without angina pectoris: Secondary | ICD-10-CM | POA: Diagnosis present

## 2017-02-24 DIAGNOSIS — E1142 Type 2 diabetes mellitus with diabetic polyneuropathy: Secondary | ICD-10-CM

## 2017-02-24 DIAGNOSIS — E119 Type 2 diabetes mellitus without complications: Secondary | ICD-10-CM | POA: Diagnosis not present

## 2017-02-24 DIAGNOSIS — E785 Hyperlipidemia, unspecified: Secondary | ICD-10-CM | POA: Diagnosis not present

## 2017-02-24 DIAGNOSIS — I4891 Unspecified atrial fibrillation: Secondary | ICD-10-CM | POA: Diagnosis present

## 2017-02-24 DIAGNOSIS — Z79899 Other long term (current) drug therapy: Secondary | ICD-10-CM | POA: Diagnosis not present

## 2017-02-24 DIAGNOSIS — I252 Old myocardial infarction: Secondary | ICD-10-CM

## 2017-02-24 DIAGNOSIS — K219 Gastro-esophageal reflux disease without esophagitis: Secondary | ICD-10-CM | POA: Diagnosis not present

## 2017-02-24 DIAGNOSIS — I110 Hypertensive heart disease with heart failure: Secondary | ICD-10-CM | POA: Diagnosis not present

## 2017-02-24 DIAGNOSIS — Z7984 Long term (current) use of oral hypoglycemic drugs: Secondary | ICD-10-CM | POA: Diagnosis not present

## 2017-02-24 DIAGNOSIS — R778 Other specified abnormalities of plasma proteins: Secondary | ICD-10-CM

## 2017-02-24 DIAGNOSIS — I517 Cardiomegaly: Secondary | ICD-10-CM | POA: Diagnosis not present

## 2017-02-24 DIAGNOSIS — I248 Other forms of acute ischemic heart disease: Secondary | ICD-10-CM | POA: Diagnosis present

## 2017-02-24 DIAGNOSIS — E1165 Type 2 diabetes mellitus with hyperglycemia: Secondary | ICD-10-CM

## 2017-02-24 DIAGNOSIS — Z7901 Long term (current) use of anticoagulants: Secondary | ICD-10-CM

## 2017-02-24 DIAGNOSIS — I5023 Acute on chronic systolic (congestive) heart failure: Secondary | ICD-10-CM

## 2017-02-24 DIAGNOSIS — I5043 Acute on chronic combined systolic (congestive) and diastolic (congestive) heart failure: Secondary | ICD-10-CM | POA: Diagnosis not present

## 2017-02-24 DIAGNOSIS — I480 Paroxysmal atrial fibrillation: Secondary | ICD-10-CM | POA: Diagnosis not present

## 2017-02-24 DIAGNOSIS — I509 Heart failure, unspecified: Secondary | ICD-10-CM | POA: Diagnosis not present

## 2017-02-24 DIAGNOSIS — R7989 Other specified abnormal findings of blood chemistry: Secondary | ICD-10-CM | POA: Diagnosis not present

## 2017-02-24 DIAGNOSIS — IMO0002 Reserved for concepts with insufficient information to code with codable children: Secondary | ICD-10-CM

## 2017-02-24 DIAGNOSIS — Z87891 Personal history of nicotine dependence: Secondary | ICD-10-CM

## 2017-02-24 DIAGNOSIS — I5032 Chronic diastolic (congestive) heart failure: Secondary | ICD-10-CM | POA: Diagnosis not present

## 2017-02-24 DIAGNOSIS — I4892 Unspecified atrial flutter: Secondary | ICD-10-CM | POA: Diagnosis not present

## 2017-02-24 DIAGNOSIS — I429 Cardiomyopathy, unspecified: Secondary | ICD-10-CM | POA: Diagnosis present

## 2017-02-24 DIAGNOSIS — R0602 Shortness of breath: Secondary | ICD-10-CM | POA: Diagnosis not present

## 2017-02-24 LAB — BASIC METABOLIC PANEL
Anion gap: 8 (ref 5–15)
BUN: 14 mg/dL (ref 6–20)
CHLORIDE: 107 mmol/L (ref 101–111)
CO2: 24 mmol/L (ref 22–32)
Calcium: 8.8 mg/dL — ABNORMAL LOW (ref 8.9–10.3)
Creatinine, Ser: 1.54 mg/dL — ABNORMAL HIGH (ref 0.61–1.24)
GFR calc Af Amer: 50 mL/min — ABNORMAL LOW (ref >60)
GFR, EST NON AFRICAN AMERICAN: 43 mL/min — AB (ref >60)
Glucose, Bld: 170 mg/dL — ABNORMAL HIGH (ref 65–99)
POTASSIUM: 3.9 mmol/L (ref 3.5–5.1)
SODIUM: 139 mmol/L (ref 135–145)

## 2017-02-24 LAB — GLUCOSE, CAPILLARY
GLUCOSE-CAPILLARY: 145 mg/dL — AB (ref 65–99)
GLUCOSE-CAPILLARY: 217 mg/dL — AB (ref 65–99)
Glucose-Capillary: 149 mg/dL — ABNORMAL HIGH (ref 65–99)

## 2017-02-24 LAB — CBC
HEMATOCRIT: 42.1 % (ref 40.0–52.0)
Hemoglobin: 13.4 g/dL (ref 13.0–18.0)
MCH: 21.4 pg — ABNORMAL LOW (ref 26.0–34.0)
MCHC: 31.7 g/dL — ABNORMAL LOW (ref 32.0–36.0)
MCV: 67.6 fL — ABNORMAL LOW (ref 80.0–100.0)
PLATELETS: 259 10*3/uL (ref 150–440)
RBC: 6.23 MIL/uL — ABNORMAL HIGH (ref 4.40–5.90)
RDW: 15.4 % — AB (ref 11.5–14.5)
WBC: 6.9 10*3/uL (ref 3.8–10.6)

## 2017-02-24 LAB — PROTIME-INR
INR: 2.38
PROTHROMBIN TIME: 25.8 s — AB (ref 11.4–15.2)

## 2017-02-24 LAB — TROPONIN I
Troponin I: 0.29 ng/mL (ref <0.03)
Troponin I: 0.3 ng/mL (ref <0.03)
Troponin I: 0.26 ng/mL (ref <0.03)

## 2017-02-24 LAB — BRAIN NATRIURETIC PEPTIDE: B NATRIURETIC PEPTIDE 5: 549 pg/mL — AB (ref 0.0–100.0)

## 2017-02-24 MED ORDER — FUROSEMIDE 10 MG/ML IJ SOLN
20.0000 mg | Freq: Two times a day (BID) | INTRAMUSCULAR | Status: DC
  Administered 2017-02-24 – 2017-02-25 (×4): 20 mg via INTRAVENOUS
  Filled 2017-02-24 (×4): qty 2

## 2017-02-24 MED ORDER — ACETAMINOPHEN 325 MG PO TABS: 650.0000 mg | ORAL_TABLET | Freq: Four times a day (QID) | ORAL | Status: DC | PRN

## 2017-02-24 MED ORDER — ONDANSETRON HCL 4 MG PO TABS: 4.0000 mg | ORAL_TABLET | Freq: Four times a day (QID) | ORAL | Status: DC | PRN

## 2017-02-24 MED ORDER — WARFARIN SODIUM 5 MG PO TABS
5.0000 mg | ORAL_TABLET | ORAL | Status: DC
  Filled 2017-02-24: qty 1

## 2017-02-24 MED ORDER — ASPIRIN 81 MG PO CHEW
CHEWABLE_TABLET | ORAL | Status: AC
  Administered 2017-02-24: 324 mg via ORAL
  Filled 2017-02-24: qty 4

## 2017-02-24 MED ORDER — ALBUTEROL SULFATE (2.5 MG/3ML) 0.083% IN NEBU: 2.5000 mg | INHALATION_SOLUTION | Freq: Four times a day (QID) | RESPIRATORY_TRACT | Status: DC | PRN

## 2017-02-24 MED ORDER — INSULIN ASPART 100 UNIT/ML ~~LOC~~ SOLN
0.0000 [IU] | Freq: Three times a day (TID) | SUBCUTANEOUS | Status: DC
  Administered 2017-02-24 (×2): 1 [IU] via SUBCUTANEOUS
  Administered 2017-02-25: 2 [IU] via SUBCUTANEOUS
  Administered 2017-02-25: 7 [IU] via SUBCUTANEOUS
  Administered 2017-02-25: 2 [IU] via SUBCUTANEOUS
  Administered 2017-02-26: 3 [IU] via SUBCUTANEOUS
  Filled 2017-02-24 (×6): qty 1

## 2017-02-24 MED ORDER — ONDANSETRON HCL 4 MG/2ML IJ SOLN: 4.0000 mg | Freq: Four times a day (QID) | INTRAMUSCULAR | Status: DC | PRN

## 2017-02-24 MED ORDER — DILTIAZEM HCL ER COATED BEADS 120 MG PO CP24
120.0000 mg | ORAL_CAPSULE | Freq: Every day | ORAL | Status: DC
  Administered 2017-02-25: 120 mg via ORAL
  Filled 2017-02-24: qty 1

## 2017-02-24 MED ORDER — ACETAMINOPHEN 650 MG RE SUPP: 650.0000 mg | Freq: Four times a day (QID) | RECTAL | Status: DC | PRN

## 2017-02-24 MED ORDER — GLIPIZIDE 10 MG PO TABS
10.0000 mg | ORAL_TABLET | Freq: Two times a day (BID) | ORAL | Status: DC
  Administered 2017-02-24 – 2017-02-25 (×3): 10 mg via ORAL
  Filled 2017-02-24 (×4): qty 1

## 2017-02-24 MED ORDER — ROSUVASTATIN CALCIUM 10 MG PO TABS
20.0000 mg | ORAL_TABLET | Freq: Every day | ORAL | Status: DC
  Administered 2017-02-25 – 2017-02-26 (×2): 20 mg via ORAL
  Filled 2017-02-24 (×2): qty 2

## 2017-02-24 MED ORDER — TRAMADOL HCL 50 MG PO TABS: 50.0000 mg | ORAL_TABLET | Freq: Four times a day (QID) | ORAL | Status: DC | PRN

## 2017-02-24 MED ORDER — FUROSEMIDE 10 MG/ML IJ SOLN
40.0000 mg | Freq: Once | INTRAMUSCULAR | Status: AC
  Administered 2017-02-24: 40 mg via INTRAVENOUS
  Filled 2017-02-24: qty 4

## 2017-02-24 MED ORDER — WARFARIN SODIUM 5 MG PO TABS
5.0000 mg | ORAL_TABLET | ORAL | Status: DC
  Administered 2017-02-25: 5 mg via ORAL
  Filled 2017-02-24: qty 1

## 2017-02-24 MED ORDER — WARFARIN SODIUM 2.5 MG PO TABS: 2.5000 mg | ORAL_TABLET | ORAL | Status: DC

## 2017-02-24 MED ORDER — DOCUSATE SODIUM 100 MG PO CAPS
100.0000 mg | ORAL_CAPSULE | Freq: Two times a day (BID) | ORAL | Status: DC
  Administered 2017-02-24 – 2017-02-26 (×5): 100 mg via ORAL
  Filled 2017-02-24 (×5): qty 1

## 2017-02-24 MED ORDER — METOPROLOL TARTRATE 25 MG PO TABS
25.0000 mg | ORAL_TABLET | Freq: Two times a day (BID) | ORAL | Status: DC
  Administered 2017-02-24 – 2017-02-26 (×4): 25 mg via ORAL
  Filled 2017-02-24 (×4): qty 1

## 2017-02-24 MED ORDER — METOPROLOL TARTRATE 25 MG PO TABS
12.5000 mg | ORAL_TABLET | Freq: Once | ORAL | Status: DC
  Filled 2017-02-24: qty 1

## 2017-02-24 MED ORDER — NITROGLYCERIN 0.4 MG SL SUBL: 0.4000 mg | SUBLINGUAL_TABLET | SUBLINGUAL | Status: DC | PRN

## 2017-02-24 MED ORDER — AMIODARONE HCL 200 MG PO TABS
400.0000 mg | ORAL_TABLET | Freq: Every day | ORAL | Status: DC
  Administered 2017-02-25 – 2017-02-26 (×2): 400 mg via ORAL
  Filled 2017-02-24 (×2): qty 2

## 2017-02-24 MED ORDER — METOPROLOL TARTRATE 25 MG PO TABS: 25.0000 mg | ORAL_TABLET | Freq: Once | ORAL | Status: DC

## 2017-02-24 MED ORDER — ENOXAPARIN SODIUM 40 MG/0.4ML ~~LOC~~ SOLN: 40.0000 mg | SUBCUTANEOUS | Status: DC

## 2017-02-24 MED ORDER — WARFARIN SODIUM 2.5 MG PO TABS
2.5000 mg | ORAL_TABLET | ORAL | Status: DC
  Filled 2017-02-24: qty 1

## 2017-02-24 MED ORDER — WARFARIN - PHARMACIST DOSING INPATIENT
Freq: Every day | Status: DC
  Administered 2017-02-25: 18:00:00

## 2017-02-24 MED ORDER — ASPIRIN 81 MG PO CHEW
324.0000 mg | CHEWABLE_TABLET | Freq: Once | ORAL | Status: AC
  Administered 2017-02-24: 324 mg via ORAL

## 2017-02-24 NOTE — ED Notes (Signed)
Date and time results received: 02/24/17  10:28 AM   Test: Trop Critical Value: 0.29  Name of Provider Notified: Dr. Darnelle CatalanMalinda  Orders Received? Or Actions Taken?: Orders Received - See Orders for details

## 2017-02-24 NOTE — Progress Notes (Addendum)
ANTICOAGULATION CONSULT NOTE - Initial Consult  Pharmacy Consult for Warfarin Dosing  Indication: atrial fibrillation  No Known Allergies  Patient Measurements: Height: 5\' 11"  (180.3 cm) Weight: 200 lb (90.7 kg) IBW/kg (Calculated) : 75.3  Vital Signs: Temp: 97.7 F (36.5 C) (08/30 1156) Temp Source: Oral (08/30 1156) BP: 162/91 (08/30 1156) Pulse Rate: 60 (08/30 1156)  Labs:  Recent Labs  02/24/17 0931 02/24/17 1121 02/24/17 1313  HGB 13.4  --   --   HCT 42.1  --   --   PLT 259  --   --   LABPROT  --  25.8*  --   INR  --  2.38  --   CREATININE 1.54*  --   --   TROPONINI 0.29*  --  0.30*    Estimated Creatinine Clearance: 50 mL/min (A) (by C-G formula based on SCr of 1.54 mg/dL (H)).   Medical History: Past Medical History:  Diagnosis Date  . Atrial fibrillation and flutter (HCC)   . CHF (congestive heart failure) (HCC)   . Diabetes mellitus with complication (HCC)   . Essential hypertension   . Gastric ulcer   . GI bleeding   . History of hiatal hernia   . Myocardial infarction (HCC)   . Ventricular tachycardia (HCC) 05/20/2014     Assessment: 71 yo male with PMH of A. Fib. Pharmacy consulted for warfarin dosing and monitoring. INR on admission was 2.38.   Home Regimen: Warfarin 5mg  Mon-Fri                              Warfarin 2.5mg  Sun, Sat  DATE INR DOSE 8/30 2.38 5mg   Goal of Therapy:  INR 2-3 Monitor platelets by anticoagulation protocol: Yes   Plan:  INR therapeutic on admission. Will continue patient's home regimen of warfarin 5mg  Mon-Fri and warfarin 2.5mg  Sat and Sun. Will recheck INR with AM labs.   Gardner CandleSheema M Hallaji, PharmD, BCPS Clinical Pharmacist  8/30@1748  spoke with RN Micah NoelLance, RN states patient has already taken today warfarin 5mg  at home, will change order to start 8/31 per pharmacist judgement.   Luan PullingGarrett Jahaira Earnhart, PharmD, MBA, BCGP Clinical Pharmacist [facility]      02/24/2017 2:57 PM

## 2017-02-24 NOTE — ED Notes (Signed)
Patient transported to X-ray 

## 2017-02-24 NOTE — ED Notes (Signed)
Dr. Patel at bedside at this time.

## 2017-02-24 NOTE — ED Triage Notes (Signed)
Pt reports SOB that began last night. Pt reports diarrhea began this morning. Denies pain of any type. Pt speaking in complete sentences without difficulty. No apparent distress noted in triage. Pt has history of CHF.

## 2017-02-24 NOTE — ED Notes (Signed)
First Nurse Note:  Pulse Ox 98% on room air.  Speech clear.

## 2017-02-24 NOTE — ED Notes (Signed)
Pt is noted to have several runs of V-tach. MD notified, copies printed and placed in chart. No change in patient condition. Will continue to monitor for further patient needs.

## 2017-02-24 NOTE — ED Provider Notes (Addendum)
[facility] Emergency Department Provider Note   ____________________________________________   First MD Initiated Contact with Patient 02/24/17 [phone]     (approximate)  I have reviewed the triage vital signs and the nursing notes.   HISTORY  Chief Complaint Shortness of Breath and Diarrhea    HPI [name] is a 71 y.o. male patient tells me he had difficulty lying down flat last night. It feels like when he was having fluid in his lungs before he got Lasix and got better. He denies any productive cough fever chills chest pain at all any other kind of chest discomfort   Past Medical History:  Diagnosis Date  . Atrial fibrillation and flutter (HCC)   . CHF (congestive heart failure) (HCC)   . Diabetes mellitus with complication (HCC)   . Essential hypertension   . Gastric ulcer   . GI bleeding   . History of hiatal hernia   . Myocardial infarction (HCC)   . Ventricular tachycardia (HCC) 05/20/2014    Patient Active Problem List   Diagnosis Date Noted  . Hemoptysis 09/26/2016  . Left knee pain 08/08/2015  . Pain and swelling of left knee 07/16/2015  . Atrial fibrillation (HCC) 07/16/2015  . Dyspnea 04/17/2015  . Atypical atrial flutter (HCC) 04/17/2015  . Acute renal insufficiency 04/17/2015  . Sinus bradycardia 04/17/2015  . Atrial flutter (HCC) 04/16/2015  . A-fib (HCC) 02/05/2015  . Essential hypertension 05/21/2014  . CKD (chronic kidney disease) stage 3, GFR 30-59 ml/min 05/21/2014  . Dyslipidemia 05/21/2014  . 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 Anemia 05/21/2014  . Ventricular tachycardia (HCC) 05/20/2014  . NSTEMI (non-ST elevated myocardial infarction) (HCC) 05/20/2014    Past Surgical History:  Procedure Laterality Date  . ELECTROPHYSIOLOGIC STUDY N/A 07/28/2015   Procedure: Cardioversion;  Surgeon: Lamar BlinksBruce J Kowalski, MD;  Location: [facility] ORS;  Service: Cardiovascular;   Laterality: N/A;  . HERNIA REPAIR    . LEFT HEART CATHETERIZATION WITH CORONARY ANGIOGRAM N/A 05/20/2014   Procedure: LEFT HEART CATHETERIZATION WITH CORONARY ANGIOGRAM;  Surgeon: Micheline ChapmanMichael D Cooper, MD;  [facility];  Service: Cardiovascular;  Laterality: N/A;    Prior to Admission medications   Medication Sig Start Date End Date Taking? Authorizing Provider  CARTIA XT 120 MG 24 hr capsule Take 120 mg by mouth daily. 02/08/17  Yes [provider]  rosuvastatin (CRESTOR) 20 MG tablet Take 20 mg by mouth daily. 02/03/17  Yes [provider]  amiodarone (PACERONE) 400 MG tablet Take 1 tablet (400 mg total) by mouth daily. 04/17/15   Katharina CaperVaickute, Rima, MD  diltiazem (CARDIZEM CD) 240 MG 24 hr capsule Take 1 capsule (240 mg total) by mouth daily. 07/21/15   Adrian SaranMody, Sital, MD  furosemide (LASIX) 40 MG tablet Take 1 tablet (40 mg total) by mouth daily. 12/07/16 12/07/17  Rockne MenghiniNorman, Anne-Caroline, MD  glipiZIDE (GLUCOTROL) 10 MG tablet TAKE ONE TABLET BY MOUTH TWICE DAILY BEFORE  A  MEAL. Patient taking differently: Take 10 mg by mouth 2 (two) times daily before a meal.  11/30/16   Ellyn HackShah, Syed Asad A, MD  hydrALAZINE (APRESOLINE) 25 MG tablet TAKE 1 TABLET BY MOUTH THREE TIMES DAILY 01/04/17   Kerman PasseyLada, Melinda P, MD  metoprolol tartrate (LOPRESSOR) 25 MG tablet Take 25 mg by mouth 2 (two) times daily.    [provider]  nitroGLYCERIN (NITROSTAT) 0.4 MG SL tablet Place 0.4 mg under the tongue every 5 (five) minutes as needed  for chest pain.     [provider]  warfarin (COUMADIN) 5 MG tablet Take 2.5-5 mg by mouth daily. 5 mg daily on Monday-Friday; 2.5 mg daily on Saturday and Sunday    [provider]    Allergies Patient has no known allergies.  Family History  Problem Relation Age of Onset  . Congestive Heart Failure Mother   . Breast cancer Mother     Social History Social History  Substance Use Topics  . Smoking status: Former Smoker    Quit date:  04/28/1964  . Smokeless tobacco: Never Used  . Alcohol use No    Review of Systems  Constitutional: No fever/chills Eyes: No visual changes. ENT: No sore throat. Cardiovascular: Denies chest pain. Respiratory: Denies shortness of breath. Gastrointestinal: No abdominal pain.  No nausea, no vomiting.  No diarrhea.  No constipation. Genitourinary: Negative for dysuria. Musculoskeletal: Negative for back pain. Skin: Negative for rash. Neurological: Negative for headaches, focal weakness   ____________________________________________   PHYSICAL EXAM:  VITAL SIGNS: ED Triage Vitals [02/24/17 0912]  Enc Vitals Group     BP (!) 164/74     Pulse Rate 61     Resp 18     Temp 98.6 F (37 C)     Temp Source Oral     SpO2 97 %     Weight 200 lb (90.7 kg)     Height 5\' 11"  (1.803 m)     Head Circumference      Peak Flow      Pain Score      Pain Loc      Pain Edu?      Excl. in GC?     Constitutional: Alert and oriented. Well appearing and in no acute distress. Eyes: Conjunctivae are normal. Head: Atraumatic. Nose: No congestion/rhinnorhea. Mouth/Throat: Mucous membranes are moist.  Oropharynx non-erythematous. Neck: No stridor.   Cardiovascular: Normal rate, regular rhythm. Grossly normal heart sounds.  Good peripheral circulation. Respiratory: Normal respiratory effort.  No retractions. Lungs CTAB. Gastrointestinal: Soft and nontender. No distention. No abdominal bruits. No CVA tenderness. Musculoskeletal: No lower extremity tendernessTrace edema.  No joint effusions. Neurologic:  Normal speech and language. No gross focal neurologic deficits are appreciated.  Skin:  Skin is warm, dry and intact. No rash noted. Psychiatric: Mood and affect are normal. Speech and behavior are normal.  ____________________________________________   LABS (all labs ordered are listed, but only abnormal results are displayed)  Labs Reviewed  BASIC METABOLIC PANEL - Abnormal; Notable for  the following:       Result Value   Glucose, Bld 170 (*)    Creatinine, Ser 1.54 (*)    Calcium 8.8 (*)    GFR calc non Af Amer 43 (*)    GFR calc Af Amer 50 (*)    All other components within normal limits  CBC - Abnormal; Notable for the following:    RBC 6.23 (*)    MCV 67.6 (*)    MCH 21.4 (*)    MCHC 31.7 (*)    RDW 15.4 (*)    All other components within normal limits  TROPONIN I - Abnormal; Notable for the following:    Troponin I 0.29 (*)    All other components within normal limits  BRAIN NATRIURETIC PEPTIDE   ____________________________________________  EKG  EKG read and interpreted by me shows sinus rhythm rate of 60 normal axis delay in R-wave progression. EKG looks similar to one from January 30 last  year ____________________________________________  RADIOLOGY  Dg Chest 2 View  Result Date: 02/24/2017 CLINICAL DATA:  Shortness of Breath EXAM: CHEST  2 VIEW COMPARISON:  12/07/2016 FINDINGS: Mild cardiomegaly. Peribronchial thickening and interstitial prominence could reflect bronchitis or mild interstitial edema. No effusions or acute bony abnormality. Findings are similar prior study. IMPRESSION: Cardiomegaly with stable peribronchial thickening and interstitial prominence which may reflect bronchitis or interstitial edema. Electronically Signed   By: Charlett Nose M.D.   On: 02/24/2017 09:52    ____________________________________________   PROCEDURES  Procedure(s) performed:   Procedures  Critical Care performed:   ____________________________________________   INITIAL IMPRESSION / ASSESSMENT AND PLAN / ED COURSE  Pertinent labs & imaging results that were available during my care of the patient were reviewed by me and considered in my medical decision making (see chart for details).  Patient's troponin is 0.29. This is elevated especially compared to previously. Patient had no chest pain tightness or other discomfort anytime recently. Discussed the  patient with Dr. Allena Katz. She does not want anything besides aspirin at present but we will we will at her suggestion give him his metoprolol on his blood pressure is somewhat elevated.    Patient has at least one episode of V. tach 6 beats and then has later episode of bradycardia.  ____________________________________________   FINAL CLINICAL IMPRESSION(S) / ED DIAGNOSES  Final diagnoses:  Congestive heart failure, unspecified HF chronicity, unspecified heart failure type (HCC)  Elevated troponin      NEW MEDICATIONS STARTED DURING THIS VISIT:  New Prescriptions   No medications on file     Note:  This document was prepared using Dragon voice recognition software and may include unintentional dictation errors.    Arnaldo Natal, MD 02/24/17 1035    Arnaldo Natal, MD 02/24/17 1057

## 2017-02-24 NOTE — ED Notes (Signed)
Dr. Malinda at bedside at this time.  

## 2017-02-24 NOTE — H&P (Signed)
[facility] - [place] [facility]   PATIENT NAME: [name]    MR#:  [id number]  DATE OF BIRTH:  1946-02-16  DATE OF ADMISSION:  02/24/2017  PRIMARY CARE PHYSICIAN: Ellyn Hack, MD   REQUESTING/REFERRING PHYSICIAN: Dr. Juliette Alcide  CHIEF COMPLAINT:  Increasing shortness of breath since yesterday  HISTORY OF PRESENT ILLNESS:  [name]  is a 71 y.o. male with a known history of combined systolic and diastolic congestive heart failure, diabetes,, hypertension, history of MI in the past status post cardiac catheterization with medical management In the ER patient was found to have elevated BNP, chest x-ray showed pulmonary vascular congestion. He received Lasix 40 mg 1 and has had good urine output. Patient is feeling better. He is being admitted with acute on chronic combined systolic and diastolic congestive heart failure.  PAST MEDICAL HISTORY:   Past Medical History:  Diagnosis Date  . Atrial fibrillation and flutter (HCC)   . CHF (congestive heart failure) (HCC)   . Diabetes mellitus with complication (HCC)   . Essential hypertension   . Gastric ulcer   . GI bleeding   . History of hiatal hernia   . Myocardial infarction (HCC)   . Ventricular tachycardia (HCC) 05/20/2014    PAST SURGICAL HISTOIRY:   Past Surgical History:  Procedure Laterality Date  . ELECTROPHYSIOLOGIC STUDY N/A 07/28/2015   Procedure: Cardioversion;  Surgeon: Lamar Blinks, MD;  Location: [facility] ORS;  Service: Cardiovascular;  Laterality: N/A;  . HERNIA REPAIR    . LEFT HEART CATHETERIZATION WITH CORONARY ANGIOGRAM N/A 05/20/2014   Procedure: LEFT HEART CATHETERIZATION WITH CORONARY ANGIOGRAM;  Surgeon: Micheline Chapman, MD;  Location: [facility] CATH LAB;  Service: Cardiovascular;  Laterality: N/A;    SOCIAL HISTORY:   Social History  Substance Use Topics  . Smoking status: Former Smoker    Quit date: 04/28/1964  . Smokeless tobacco: Never Used  . Alcohol use  No    FAMILY HISTORY:   Family History  Problem Relation Age of Onset  . Congestive Heart Failure Mother   . Breast cancer Mother     DRUG ALLERGIES:  No Known Allergies  REVIEW OF SYSTEMS:  Review of Systems  Constitutional: Negative for chills, fever and weight loss.  HENT: Negative for ear discharge, ear pain and nosebleeds.   Eyes: Negative for blurred vision, pain and discharge.  Respiratory: Positive for shortness of breath. Negative for sputum production, wheezing and stridor.   Cardiovascular: Positive for orthopnea and PND. Negative for chest pain and palpitations.  Gastrointestinal: Negative for abdominal pain, diarrhea, nausea and vomiting.  Genitourinary: Negative for frequency and urgency.  Musculoskeletal: Negative for back pain and joint pain.  Neurological: Positive for weakness. Negative for sensory change, speech change and focal weakness.  Psychiatric/Behavioral: Negative for depression and hallucinations. The patient is not nervous/anxious.      MEDICATIONS AT HOME:   Prior to Admission medications   Medication Sig Start Date End Date Taking? Authorizing Provider  amiodarone (PACERONE) 400 MG tablet Take 1 tablet (400 mg total) by mouth daily. 04/17/15  Yes Katharina Caper, MD  CARTIA XT 120 MG 24 hr capsule Take 120 mg by mouth daily. 02/08/17  Yes [provider]  furosemide (LASIX) 40 MG tablet Take 1 tablet (40 mg total) by mouth daily. 12/07/16 12/07/17 Yes Rockne Menghini, MD  glipiZIDE (GLUCOTROL) 10 MG tablet TAKE ONE TABLET BY MOUTH TWICE DAILY BEFORE  A  MEAL. Patient taking differently: Take 10  mg by mouth 2 (two) times daily before a meal.  11/30/16  Yes Velta Addison A, MD  metoprolol tartrate (LOPRESSOR) 25 MG tablet Take 25 mg by mouth 2 (two) times daily.   Yes [provider]  nitroGLYCERIN (NITROSTAT) 0.4 MG SL tablet Place 0.4 mg under the tongue every 5 (five) minutes as needed for chest pain.    Yes [provider]  rosuvastatin (CRESTOR) 20 MG tablet Take 20 mg by mouth daily. 02/03/17  Yes [provider]  warfarin (COUMADIN) 5 MG tablet Take 2.5-5 mg by mouth daily. 5 mg daily on Monday-Friday; 2.5 mg daily on Saturday and Sunday   Yes [provider]  hydrALAZINE (APRESOLINE) 25 MG tablet TAKE 1 TABLET BY MOUTH THREE TIMES DAILY Patient not taking: Reported on 02/24/2017 01/04/17   Kerman Passey, MD      VITAL SIGNS:  Blood pressure (!) 168/94, pulse 63, temperature 98.6 F (37 C), temperature source Oral, resp. rate 17, height 5\' 11"  (1.803 m), weight 90.7 kg (200 lb), SpO2 96 %.  PHYSICAL EXAMINATION:  GENERAL:  71 y.o.-year-old patient lying in the bed with no acute distress.  EYES: Pupils equal, round, reactive to light and accommodation. No scleral icterus. Extraocular muscles intact.  HEENT: Head atraumatic, normocephalic. Oropharynx and nasopharynx clear.  NECK:  Supple, no jugular venous distention. No thyroid enlargement, no tenderness.  LUNGS: Normal breath sounds bilaterally, no wheezing,resident for bibasilar rales,no rhonchi or crepitation. No use of accessory muscles of respiration.  CARDIOVASCULAR: S1, S2 normal. No murmurs, rubs, or gallops.  ABDOMEN: Soft, nontender, nondistended. Bowel sounds present. No organomegaly or mass.  EXTREMITIES: No pedal edema, cyanosis, or clubbing.  NEUROLOGIC: Cranial nerves II through XII are intact. Muscle strength 5/5 in all extremities. Sensation intact. Gait not checked.  PSYCHIATRIC: The patient is alert and oriented x 3.  SKIN: No obvious rash, lesion, or ulcer.   LABORATORY PANEL:   CBC  Recent Labs Lab 02/24/17 0931  WBC 6.9  HGB 13.4  HCT 42.1  PLT 259   ------------------------------------------------------------------------------------------------------------------  Chemistries   Recent Labs Lab 02/24/17 0931  NA 139  K 3.9  CL 107  CO2 24  GLUCOSE 170*  BUN 14  CREATININE 1.54*   CALCIUM 8.8*   ------------------------------------------------------------------------------------------------------------------  Cardiac Enzymes  Recent Labs Lab 02/24/17 0931  TROPONINI 0.29*   ------------------------------------------------------------------------------------------------------------------  RADIOLOGY:  Dg Chest 2 View  Result Date: 02/24/2017 CLINICAL DATA:  Shortness of Breath EXAM: CHEST  2 VIEW COMPARISON:  12/07/2016 FINDINGS: Mild cardiomegaly. Peribronchial thickening and interstitial prominence could reflect bronchitis or mild interstitial edema. No effusions or acute bony abnormality. Findings are similar prior study. IMPRESSION: Cardiomegaly with stable peribronchial thickening and interstitial prominence which may reflect bronchitis or interstitial edema. Electronically Signed   By: Charlett Nose M.D.   On: 02/24/2017 09:52    EKG:    IMPRESSION AND PLAN:   [name]  is a 71 y.o. male with a known history of combined systolic and diastolic congestive heart failure, diabetes,, hypertension, history of MI in the past status post cardiac catheterization with medical management In the ER patient was found to have elevated BNP, chest x-ray showed pulmonary vascular congestion.  1. Acute on chronic combined systolic/diastolic congestive heart failur -patient presented with increasing shortness of breath and found to have elevated BNP and pulmonary vascular congestion on chest x-ray -IV Lasix 20 mg twice a day -Monitor I's and O's, monitor creatinine -Oxygen as needed -Patient had  echo done in August 2018 with cardiology as outpatient. Showed EF of 40%, moderate MR, moderate TR I will not repeat echo  2. Coronary artery disease status post cardiac catheterization 2 years ago -Patient was recommended medical management -Resume Cardizem, beta blockers, aspirin, statins  3. Mild elevated troponin up his demand ischemia in the setting of congestive  heart failure -Came in with troponin of 0.29 -we'll cycle cardiac markers. Consider cardiac consultation if needed patient follows with Dr. Gwen PoundsKowalski  4. Type 2 diabetes -sliding scale and glipizide  5. Hyperlipidemia -Continue statins  Above was discussed with patient and patient's daughter in the ER  All the records are reviewed and case discussed with ED provider. Management plans discussed with the patient, family and they are in agreement.  CODE STATUS: full  TOTAL TIME TAKING CARE OF THIS PATIENT: *50* minutes.    [name] M.D on 02/24/2017 at 11:49 AM  Between 7am to 6pm - Pager - [phone]  After 6pm go to [email or web address] - password [email or web address] [facility]  [facility]  Office  [phone]  CC: Primary care physician; Ellyn HackShah, Syed Asad A, MD

## 2017-02-25 LAB — TROPONIN I: Troponin I: 0.27 ng/mL (ref <0.03)

## 2017-02-25 LAB — GLUCOSE, CAPILLARY
GLUCOSE-CAPILLARY: 322 mg/dL — AB (ref 65–99)
GLUCOSE-CAPILLARY: 182 mg/dL — AB (ref 65–99)
GLUCOSE-CAPILLARY: 256 mg/dL — AB (ref 65–99)
Glucose-Capillary: 165 mg/dL — ABNORMAL HIGH (ref 65–99)

## 2017-02-25 LAB — PROTIME-INR
INR: 2.46
PROTHROMBIN TIME: 26.5 s — AB (ref 11.4–15.2)

## 2017-02-25 MED ORDER — DILTIAZEM HCL 25 MG/5ML IV SOLN
10.0000 mg | Freq: Once | INTRAVENOUS | Status: AC
  Administered 2017-02-25: 10 mg via INTRAVENOUS
  Filled 2017-02-25 (×2): qty 5

## 2017-02-25 NOTE — Progress Notes (Signed)
[facility] - [place] at [facility]   PATIENT NAME: Jay BosWilliam Baggerly    MR#:  [id number]  DATE OF BIRTH:  07/20/1945  SUBJECTIVE:    Patient still with some shortness of breath. Telemetry monitoring shows heart rates up to 140s in atrial flutter  REVIEW OF SYSTEMS:    Review of Systems  Constitutional: Negative for fever, chills weight loss HENT: Negative for ear pain, nosebleeds, congestion, facial swelling, rhinorrhea, neck pain, neck stiffness and ear discharge.   Respiratory: Negative for cough, ++shortness of breath, NO wheezing  Cardiovascular: Negative for chest pain, and leg swelling. Positive for palpitations Gastrointestinal: Negative for heartburn, abdominal pain, vomiting, diarrhea or consitpation Genitourinary: Negative for dysuria, urgency, frequency, hematuria Musculoskeletal: Negative for back pain or joint pain Neurological: Negative for dizziness, seizures, syncope, focal weakness,  numbness and headaches.  Hematological: Does not bruise/bleed easily.  Psychiatric/Behavioral: Negative for hallucinations, confusion, dysphoric mood    Tolerating Diet: yes      DRUG ALLERGIES:  No Known Allergies  VITALS:  Blood pressure (!) 147/83, pulse 60, temperature 98 F (36.7 C), temperature source Oral, resp. rate 18, height 5\' 11"  (1.803 m), weight 90.7 kg (200 lb), SpO2 98 %.  PHYSICAL EXAMINATION:   Constitutional: Appears well-developed and well-nourished. No distress. HENT: Normocephalic. Marland Kitchen. Oropharynx is clear and moist.  Eyes: Conjunctivae and EOM are normal. PERRLA, no scleral icterus.  Neck: Normal ROM. Neck supple. No JVD. No tracheal deviation. CVS: RRR, Tachycardic with regular, irregular heartbeat no murmur no gallops, no carotid bruit.  Pulmonary: Effort and breath sounds normal, no stridor, rhonchi, wheezes, rales.  Abdominal: Soft. BS +,  no distension, tenderness, rebound or guarding.  Musculoskeletal: Normal range of motion. No  edema and no tenderness.  Neuro: Alert. CN 2-12 grossly intact. No focal deficits. Skin: Skin is warm and dry. No rash noted. Psychiatric: Normal mood and affect.      LABORATORY PANEL:   CBC  Recent Labs Lab 02/24/17 0931  WBC 6.9  HGB 13.4  HCT 42.1  PLT 259   ------------------------------------------------------------------------------------------------------------------  Chemistries   Recent Labs Lab 02/24/17 0931  NA 139  K 3.9  CL 107  CO2 24  GLUCOSE 170*  BUN 14  CREATININE 1.54*  CALCIUM 8.8*   ------------------------------------------------------------------------------------------------------------------  Cardiac Enzymes  Recent Labs Lab 02/24/17 1313 02/24/17 1828 02/25/17 0005  TROPONINI 0.30* 0.26* 0.27*   ------------------------------------------------------------------------------------------------------------------  RADIOLOGY:  Dg Chest 2 View  Result Date: 02/24/2017 CLINICAL DATA:  Shortness of Breath EXAM: CHEST  2 VIEW COMPARISON:  12/07/2016 FINDINGS: Mild cardiomegaly. Peribronchial thickening and interstitial prominence could reflect bronchitis or mild interstitial edema. No effusions or acute bony abnormality. Findings are similar prior study. IMPRESSION: Cardiomegaly with stable peribronchial thickening and interstitial prominence which may reflect bronchitis or interstitial edema. Electronically Signed   By: Charlett NoseKevin  Dover M.D.   On: 02/24/2017 09:52     ASSESSMENT AND PLAN:   71 year old male with history of combined systolic and diastolic heart. Ejection fraction of 40% who presents with shortness of breath.  1. 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 Atrial flutter/fibrillation with RVR: 1 time dose of IV diltiazem Continue oral diltiazem, metoprolol and amiodarone Continue  Coumadin Discussed case with cardiology who will see patient this afternoon  Question if patient may need cardioversion at some point if cannot control heart rate with  oral medications   3. Elevated troponin due to demand ischemia. Patient has ruled out for ACS.  4. Type 2 diabetes: Continue oral medications with ADA diet and sliding scale  5. Hyperlipidemia: Continue statin  6. Essential hypertension: Continue diltiazem and metoprolol   Management plans discussed with the patient and  He  is in agreement.  CODE STATUS: full  TOTAL TIME TAKING CARE OF THIS PATIENT: 30 minutes.     POSSIBLE D/C tomorrow, DEPENDING ON CLINICAL CONDITION.   Marshal Schrecengost M.D on 02/25/2017 at 9:48 AM  Between 7am to 6pm - Pager - [phone] After 6pm go to [email or web address] - Social research officer, government  [facility] [place] [facility]  Office  [phone]  CC: Primary care physician; Ellyn Hack, MD  Note: This dictation was prepared with Dragon dictation along with smaller phrase technology. Any transcriptional errors that result from this process are unintentional.

## 2017-02-25 NOTE — Care Management (Signed)
Patient admitted from home with congestive heart failure and has had issues with atrial flutter with rates up to 140. Does have history of atrial fib/flutter.  Patient says CHF is not a new diagnosis for him.  He says he weighs every day and writes it down in a book. Discussed providing patient with Living with Heart Failure Education Booklet during progression.  Patient says he has been seen at the Heart Failure Clinic but do not see any encounters in Epic.  Sent to Heart Failure Clinic for possible referral.

## 2017-02-25 NOTE — Progress Notes (Signed)
Inpatient Diabetes Program Recommendations  AACE/ADA: New Consensus Statement on Inpatient Glycemic Control (2015)  Target Ranges:  Prepandial:   less than 140 mg/dL      Peak postprandial:   less than 180 mg/dL (1-2 hours)      Critically ill patients:  140 - 180 mg/dL   Lab Results  Component Value Date   GLUCAP 165 ([name]) 02/25/2017   HGBA1C 7.9 11/30/2016    Review of Glycemic Control  Results for [name], [name] (MRN [id number]) as of 02/25/2017 09:48  Ref. Range 02/24/2017 12:28 02/24/2017 17:09 02/24/2017 20:40 02/25/2017 08:00  Glucose-Capillary Latest Ref Range: 65 - 99 mg/dL 132145 ([name]) 440149 ([name]) 102217 ([name]) 165 ([name])    Diabetes history: Type 2 Outpatient Diabetes medications: Glipizide 10mg  bid Current orders for Inpatient glycemic control: Glipizide 10mg  bid, Novolog 0-9 units tid  Inpatient Diabetes Program Recommendations:  Please d/c Glipizide while inpatient - high risk hypoglycemia.    Consider Lantus 9 units qhs (0.1units/kg)  Susette RacerJulie Carsyn Taubman, RN, [place], [place]HA, CDE Diabetes Coordinator Inpatient Diabetes Program  [phone] (Team Pager) [phone] [facility] Office) 02/25/2017 9:50 AM

## 2017-02-25 NOTE — Progress Notes (Addendum)
Patient with dx of Heart Failure.  Primary Cardiologist:  Dr. Gwen PoundsKowalski.  Patient has hx of combined systolic and diastolic HF, DM, HTN, MI.  Echo performed earlier in August EF of 40%.  Reviewed booklet "Living Better with Heart Failure"  with patient and family. Reviewed the definition of HF with patient and family.  Explained what the EF measurement means.  Reviewed the HF Zones and the importance of weighing himself daily along with self assessment of his symptoms every morning.  Low sodium/carb modified heart healthy diet reviewed.  This RN entered an order for Dietitian Consult.  Entered order for Daily Weights.  Types of  Medications used to treat HF were reviewed with patient. The importance of exercise explained to patient family.  Patient has [employer] Medicare insurance coverage.  Explained to patient and family that he does not meet Medicare guidelines to participate in Cardiac Rehab, but he does meet criteria for Pulmonary Rehab.  Patient and family expressed interest.  Patient was wanting to know if his insurance would pay for it.  I informed him our Environmental health practitionerAdministrative Assistant in her experience the co-pay would be $10 per session.  I encouraged him to check with his insurance.  An overview of the program provided.  Brochure with phone number for Pulmonary Rehab provided.  Order entered for Pulmonary Rehab per our standing protocol. Patient stated he wanted to think about it.  I informed the patient the Pulmonary Rehab Dept will be contacting him next week.   Informed patient he has an appointment in the [facility] on 03/04/2017 at 11:20 a.m.  Rationale for this appointment and purpose of the HF Clinic explained to patient and family.  Explained to patient the [facility] is an additional resource for HF patients to help them manage their HF.  Patient and family very engaged during educational session and asked pertinent questions.        Army Meliaiane Terisha Losasso, RN, BSN, [facility] [place]  [facility] Cardiac &  Pulmonary Rehab Cardiovascular & Pulmonary Nurse Navigator Direct Line: [phone] Department Phone #: [phone] Fax: [phone] Email Address: [email or web address]

## 2017-02-25 NOTE — Progress Notes (Signed)
Patient educated on the importance of daily weights, monitoring fluid intake and signs/symptoms of fluid overload. Heart Failure booklet given to patient.

## 2017-02-25 NOTE — Progress Notes (Signed)
MD Pyreddy paged to make aware of pt's HR and no PRN orders.

## 2017-02-25 NOTE — Progress Notes (Signed)
Nutrition Education Note  RD consulted for nutrition education regarding CHF and DM.  RD provided "Low Sodium Nutrition Therapy" handout from the Academy of Nutrition and Dietetics. Reviewed patient's dietary recall. Provided examples on ways to decrease sodium intake in diet. Discouraged intake of processed foods and use of salt shaker. Encouraged fresh fruits and vegetables as well as whole grain sources of carbohydrates to maximize fiber intake.   RD discussed why it is important for patient to adhere to diet recommendations, and emphasized the role of fluids, foods to avoid, and importance of weighing self daily.   RD also provided "Nutrition with Type II Diabetes" handout from the Academy of Nutrition and Dietetics. Discussed different food groups and their effects on blood sugar, emphasizing carbohydrate-containing foods. Provided list of carbohydrates and recommended serving sizes of common foods.  Discussed importance of controlled and consistent carbohydrate intake throughout the day. Provided examples of ways to balance meals/snacks and encouraged intake of high-fiber, whole grain complex carbohydrates.   Teach back method used.  Expect good compliance.  Body mass index is 34.18 kg/m. Pt meets criteria for obese  based on current BMI.  Current diet order is 2 gram sodium, patient is consuming approximately 100% of meals at this time. Labs and medications reviewed. No further nutrition interventions warranted at this time. RD contact information provided. If additional nutrition issues arise, please re-consult RD.   Betsey Holidayasey Shruti Arrey MS, RD, LDN Pager #- [phone] After Hours Pager: [phone]

## 2017-02-25 NOTE — Progress Notes (Signed)
ANTICOAGULATION CONSULT NOTE - Consult  Pharmacy Consult for Warfarin Dosing  Indication: atrial fibrillation  No Known Allergies  Patient Measurements: Height: 5\' 11"  (180.3 cm) Weight: 200 lb (90.7 kg) IBW/kg (Calculated) : 75.3  Vital Signs: Temp: 97.3 F (36.3 C) (08/31 1148) Temp Source: Oral (08/31 1148) BP: 155/89 (08/31 1148) Pulse Rate: 62 (08/31 1148)  Labs:  Recent Labs  02/24/17 0931 02/24/17 1121 02/24/17 1313 02/24/17 1828 02/25/17 0005 02/25/17 0429  HGB 13.4  --   --   --   --   --   HCT 42.1  --   --   --   --   --   PLT 259  --   --   --   --   --   LABPROT  --  25.8*  --   --   --  26.5*  INR  --  2.38  --   --   --  2.46  CREATININE 1.54*  --   --   --   --   --   TROPONINI 0.29*  --  0.30* 0.26* 0.27*  --     Estimated Creatinine Clearance: 50 mL/min (A) (by C-G formula based on SCr of 1.54 mg/dL (H)).   Medical History: Past Medical History:  Diagnosis Date  . Atrial fibrillation and flutter (HCC)   . CHF (congestive heart failure) (HCC)   . Diabetes mellitus with complication (HCC)   . Essential hypertension   . Gastric ulcer   . GI bleeding   . History of hiatal hernia   . Myocardial infarction (HCC)   . Ventricular tachycardia (HCC) 05/20/2014     Assessment: 71 yo male with PMH of A. Fib. Pharmacy consulted for warfarin dosing and monitoring. INR on admission was 2.38.   Home Regimen: Warfarin 5mg  Mon-Fri                              Warfarin 2.5mg  Sun, Sat  DATE INR DOSE 8/30 2.38 5mg  8/31 2.46 5mg    Goal of Therapy:  INR 2-3 Monitor platelets by anticoagulation protocol: Yes   Plan:  INR has been therapeutic for 2 consecutive levels, will check next INR 9/3. Will continue patient's home regimen of warfarin 5mg  Mon-Fri and warfarin 2.5mg  Sat and Sun.  8/30 @1748  RN Micah NoelLance states patient has already taken today warfarin 5mg  at home, will change order to start 8/31 per pharmacist judgement.    Jay CedarStephanie Michel [name]   Pharmacy Resident  02/25/2017 2:22 PM

## 2017-02-25 NOTE — Consult Note (Signed)
Reason for Consult: Congestive heart failure as well as rapid atrial fibrillation Referring Physician: Dr. Brigitte Pulse primary physician Dr. Fritzi Mandes hospitalist Dr [name] cardiologist  [name] is an 71 y.o. male.  HPI: Patient 71 year old male history of congestive heart failure systolic dysfunction and diastolic dysfunction history of cardiomyopathy diabetes hypertension previous myocardial infarction history of ablation history of for atrial fibrillation in the past. Patient now has chronic recurrent atrial fibrillation in the 90s on amiodarone. Patient's had rapid A. fib but presented with congestive heart failure symptoms shortness of breath treated with IV diuretics and seems to have significant improvement. Denies any significant chest pain. Lasted about [name] a few weeks ago. When the patient presented emergency room he was found to have pulmonary vascular congestion elevated BMP shortness of breath IVs Lasix) urine output and the patient had significant improvement with no significant chest pain. Patient is ambulated on the floor without significant dyspnea on exertion no leg swelling. Is any significant palpitations or tachycardia. Patient's had borderline troponins. No significant evidence of ischemia  Past Medical History:  Diagnosis Date  . Atrial fibrillation and flutter ([place])   . CHF (congestive heart failure) ([place])   . Diabetes mellitus with complication ([place])   . Essential hypertension   . Gastric ulcer   . GI bleeding   . History of hiatal hernia   . Myocardial infarction ([place])   . Ventricular tachycardia ([place]) 05/20/2014    Past Surgical History:  Procedure Laterality Date  . ELECTROPHYSIOLOGIC STUDY N/A 07/28/2015   Procedure: Cardioversion;  Surgeon: Corey Skains, MD;  Location: [facility] ORS;  Service: Cardiovascular;  Laterality: N/A;  . HERNIA REPAIR    . LEFT HEART CATHETERIZATION WITH CORONARY ANGIOGRAM N/A 05/20/2014   Procedure: LEFT HEART  CATHETERIZATION WITH CORONARY ANGIOGRAM;  Surgeon: Blane Ohara, MD;  Location: [facility] CATH LAB;  Service: Cardiovascular;  Laterality: N/A;    Family History  Problem Relation Age of Onset  . Congestive Heart Failure Mother   . Breast cancer Mother     Social History:  reports that he quit smoking about 52 years ago. He has never used smokeless tobacco. He reports that he does not drink alcohol or use drugs.  Allergies: No Known Allergies  Medications: I have reviewed the patient's current medications.  Results for orders placed or performed during the hospital encounter of 02/24/17 (from the past 48 hour(s))  Basic metabolic panel     Status: Abnormal   Collection Time: 02/24/17  9:31 AM  Result Value Ref Range   Sodium 139 135 - 145 mmol/L   Potassium 3.9 3.5 - 5.1 mmol/L   Chloride 107 101 - 111 mmol/L   CO2 24 22 - 32 mmol/L   Glucose, Bld 170 (H) 65 - 99 mg/dL   BUN 14 6 - 20 mg/dL   Creatinine, Ser 1.54 (H) 0.61 - 1.24 mg/dL   Calcium 8.8 (L) 8.9 - 10.3 mg/dL   GFR calc non Af Amer 43 (L) >60 mL/min   GFR calc Af Amer 50 (L) >60 mL/min    Comment: (NOTE) The eGFR has been calculated using the CKD EPI equation. This calculation has not been validated in all clinical situations. eGFR's persistently <60 mL/min signify possible Chronic Kidney Disease.    Anion gap 8 5 - 15  CBC     Status: Abnormal   Collection Time: 02/24/17  9:31 AM  Result Value Ref Range   WBC 6.9 3.8 - 10.6 K/uL   RBC  6.23 (H) 4.40 - 5.90 MIL/uL   Hemoglobin 13.4 13.0 - 18.0 g/dL   HCT 42.1 40.0 - 52.0 %   MCV 67.6 (L) 80.0 - 100.0 fL   MCH 21.4 (L) 26.0 - 34.0 pg   MCHC 31.7 (L) 32.0 - 36.0 g/dL   RDW 15.4 (H) 11.5 - 14.5 %   Platelets 259 150 - 440 K/uL  Troponin I     Status: Abnormal   Collection Time: 02/24/17  9:31 AM  Result Value Ref Range   Troponin I 0.29 (HH) <0.03 ng/mL    Comment: CRITICAL RESULT CALLED TO, READ BACK BY AND VERIFIED WITH  MEGAN JONES AT 1017 02/24/17 SDR    Brain natriuretic peptide     Status: Abnormal   Collection Time: 02/24/17  9:31 AM  Result Value Ref Range   B Natriuretic Peptide 549.0 (H) 0.0 - 100.0 pg/mL  Protime-INR     Status: Abnormal   Collection Time: 02/24/17 11:21 AM  Result Value Ref Range   Prothrombin Time 25.8 (H) 11.4 - 15.2 seconds   INR 2.38   Glucose, capillary     Status: Abnormal   Collection Time: 02/24/17 12:28 PM  Result Value Ref Range   Glucose-Capillary 145 (H) 65 - 99 mg/dL   Comment 1 Notify RN   Troponin I     Status: Abnormal   Collection Time: 02/24/17  1:13 PM  Result Value Ref Range   Troponin I 0.30 (HH) <0.03 ng/mL    Comment: CRITICAL VALUE NOTED. VALUE IS CONSISTENT WITH PREVIOUSLY REPORTED/CALLED VALUE.MSS  Glucose, capillary     Status: Abnormal   Collection Time: 02/24/17  5:09 PM  Result Value Ref Range   Glucose-Capillary 149 (H) 65 - 99 mg/dL   Comment 1 Notify RN   Troponin I     Status: Abnormal   Collection Time: 02/24/17  6:28 PM  Result Value Ref Range   Troponin I 0.26 (HH) <0.03 ng/mL    Comment: CRITICAL VALUE NOTED. VALUE IS CONSISTENT WITH PREVIOUSLY REPORTED/CALLED VALUE.MSS  Glucose, capillary     Status: Abnormal   Collection Time: 02/24/17  8:40 PM  Result Value Ref Range   Glucose-Capillary 217 (H) 65 - 99 mg/dL   Comment 1 Notify RN   Troponin I     Status: Abnormal   Collection Time: 02/25/17 12:05 AM  Result Value Ref Range   Troponin I 0.27 (HH) <0.03 ng/mL    Comment: CRITICAL VALUE NOTED. VALUE IS CONSISTENT WITH PREVIOUSLY REPORTED/CALLED VALUE BY CAF   Protime-INR     Status: Abnormal   Collection Time: 02/25/17  4:29 AM  Result Value Ref Range   Prothrombin Time 26.5 (H) 11.4 - 15.2 seconds   INR 2.46   Glucose, capillary     Status: Abnormal   Collection Time: 02/25/17  8:00 AM  Result Value Ref Range   Glucose-Capillary 165 (H) 65 - 99 mg/dL   Comment 1 Notify RN   Glucose, capillary     Status: Abnormal   Collection Time: 02/25/17 11:47 AM   Result Value Ref Range   Glucose-Capillary 322 (H) 65 - 99 mg/dL   Comment 1 Notify RN     Dg Chest 2 View  Result Date: 02/24/2017 CLINICAL DATA:  Shortness of Breath EXAM: CHEST  2 VIEW COMPARISON:  12/07/2016 FINDINGS: Mild cardiomegaly. Peribronchial thickening and interstitial prominence could reflect bronchitis or mild interstitial edema. No effusions or acute bony abnormality. Findings are similar prior study. IMPRESSION: Cardiomegaly  with stable peribronchial thickening and interstitial prominence which may reflect bronchitis or interstitial edema. Electronically Signed   By: Rolm Baptise M.[name].   On: 02/24/2017 09:52    Review of Systems  Constitutional: Positive for diaphoresis and malaise/fatigue.  HENT: Positive for congestion.   Eyes: Negative.   Respiratory: Positive for shortness of breath.   Cardiovascular: Positive for orthopnea and PND.  Gastrointestinal: Negative.   Genitourinary: Negative.   Musculoskeletal: Negative.   Skin: Negative.   Neurological: Positive for weakness.  Endo/Heme/Allergies: Negative.   Psychiatric/Behavioral: Negative.    Blood pressure (!) 155/89, pulse 62, temperature (!) 97.3 F (36.3 C), temperature source Oral, resp. rate 14, height 5' 11"  (1.803 m), weight 90.7 kg (200 lb), SpO2 100 %. Physical Exam  Nursing note and vitals reviewed. Constitutional: He is oriented to person, place, and time. He appears well-developed and well-nourished.  HENT:  Head: Normocephalic and atraumatic.  Eyes: Pupils are equal, round, and reactive to light. Conjunctivae and EOM are normal.  Neck: Normal range of motion.  Cardiovascular: Normal rate, S1 normal, S2 normal and normal pulses.  An irregularly irregular rhythm present. Exam reveals gallop.   Murmur heard.  Systolic murmur is present with a grade of 2/6  Respiratory: Effort normal and breath sounds normal.  GI: Soft.  Musculoskeletal: Normal range of motion.  Neurological: He is alert and  oriented to person, place, and time. He has normal reflexes.  Skin: Skin is warm and dry.  Psychiatric: He has a normal mood and affect. His behavior is normal.    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 Plan Agree with admission to telemetry rule out for myocardial infarction continue telemetry Short-term anticoagulation Continue IV diuretic therapy with increased Lasix Continue diabetes management with Glucotrol Maintain amiodarone therapy for antiarrhythmic Rate control for A. fib with Cardizem and metoprolol Hypertension control with hydralazine diltiazem and metoprolol Recommend Crestor for hyperlipidemia therapy be continued Anticoagulation with Coumadin Have the patient follow-up with nephrology for renal insufficiency Consider adding Imdur in addition to hydralazine to help with heart failure management Consider ACE inhibitor or ARB for heart failure therapy Have the patient follow-up with Dr. Nehemiah [name] is a outpatient   [name] [name] 02/25/2017, 1:12 PM

## 2017-02-25 NOTE — Discharge Instructions (Signed)
Heart Failure Clinic appointment on March 04 2017 at 11:20am with Clarisa Kindredina Tailer Volkert, FNP. Please call [phone] to reschedule.

## 2017-02-25 NOTE — Care Management Important Message (Signed)
Important Message  Patient Details  Name: [name] MRN: [id number] Date of Birth: 12/07/1945   Medicare Important Message Given:  Yes Signed IM notice given    Eber HongGreene, Addi Pak R, RN 02/25/2017, 12:30 PM

## 2017-02-25 NOTE — Progress Notes (Signed)
MD Pyreddy paged to made aware of pt's HR and no PRN orders.

## 2017-02-26 ENCOUNTER — Inpatient Hospital Stay: Payer: Medicare HMO

## 2017-02-26 LAB — BASIC METABOLIC PANEL
Anion gap: 10 (ref 5–15)
BUN: 23 mg/dL — AB (ref 6–20)
CALCIUM: 8.7 mg/dL — AB (ref 8.9–10.3)
CO2: 26 mmol/L (ref 22–32)
Chloride: 103 mmol/L (ref 101–111)
Creatinine, Ser: 1.56 mg/dL — ABNORMAL HIGH (ref 0.61–1.24)
GFR calc Af Amer: 49 mL/min — ABNORMAL LOW (ref >60)
GFR, EST NON AFRICAN AMERICAN: 43 mL/min — AB (ref >60)
GLUCOSE: 171 mg/dL — AB (ref 65–99)
POTASSIUM: 3.3 mmol/L — AB (ref 3.5–5.1)
SODIUM: 139 mmol/L (ref 135–145)

## 2017-02-26 LAB — GLUCOSE, CAPILLARY: GLUCOSE-CAPILLARY: 204 mg/dL — AB (ref 65–99)

## 2017-02-26 MED ORDER — DILTIAZEM HCL ER COATED BEADS 180 MG PO CP24: 180.0000 mg | ORAL_CAPSULE | Freq: Every day | ORAL | 0 refills | Status: AC

## 2017-02-26 MED ORDER — POTASSIUM CHLORIDE ER 10 MEQ PO TBCR: 10.0000 meq | EXTENDED_RELEASE_TABLET | Freq: Every day | ORAL | 0 refills | Status: AC

## 2017-02-26 MED ORDER — FUROSEMIDE 20 MG PO TABS: 20.0000 mg | ORAL_TABLET | Freq: Every day | ORAL | 0 refills | Status: AC

## 2017-02-26 MED ORDER — INSULIN GLARGINE 100 UNIT/ML ~~LOC~~ SOLN
9.0000 [IU] | Freq: Every day | SUBCUTANEOUS | Status: DC
  Administered 2017-02-26: 9 [IU] via SUBCUTANEOUS
  Filled 2017-02-26: qty 0.09

## 2017-02-26 MED ORDER — DILTIAZEM HCL ER COATED BEADS 180 MG PO CP24
180.0000 mg | ORAL_CAPSULE | Freq: Every day | ORAL | Status: DC
  Administered 2017-02-26: 180 mg via ORAL
  Filled 2017-02-26: qty 1

## 2017-02-26 MED ORDER — GLIPIZIDE 10 MG PO TABS: 10.0000 mg | ORAL_TABLET | Freq: Two times a day (BID) | ORAL | Status: DC

## 2017-02-26 NOTE — Discharge Summary (Signed)
[employer] [facility] [place] [facility]   PATIENT NAME: [name]    MR#:  [id number]  DATE OF BIRTH:  1946-04-17  DATE OF ADMISSION:  02/24/2017 ADMITTING PHYSICIAN: Enedina Finner, MD  DATE OF DISCHARGE: 02/26/2017  PRIMARY CARE PHYSICIAN: Ellyn Hack, MD    ADMISSION DIAGNOSIS:  Elevated troponin [R74.8] Congestive heart failure, unspecified HF chronicity, unspecified heart failure type (HCC) [I50.9]  DISCHARGE DIAGNOSIS:  Active Problems:   CHF (congestive heart failure), NYHA class III, acute on chronic, systolic (HCC)   SECONDARY DIAGNOSIS:   Past Medical History:  Diagnosis Date  . Atrial fibrillation and flutter (HCC)   . CHF (congestive heart failure) (HCC)   . Diabetes mellitus with complication (HCC)   . Essential hypertension   . Gastric ulcer   . GI bleeding   . History of hiatal hernia   . Myocardial infarction (HCC)   . Ventricular tachycardia (HCC) 05/20/2014    HOSPITAL COURSE:   71 year old male with history of combined systolic and diastolic heart. Ejection fraction of 40% who presents with shortness of breath.  1. Acute on chronic combined systolic and diastolic heart failure in the setting of tachycardia, EF 40% by recent echo Patient has diuresed well. He will follow-up with CHF clinic at discharge. He will be started on Lasix 20 mg daily with potassium supplementation. He has follow up on Wednesday with cardiology.   2. Atrial flutter/fibrillation with RVR: Continue oral increased dose of diltiazem, metoprolol and amiodarone Continue Coumadin   3. Elevated troponin due to demand ischemia. Patient has ruled out for ACS.  4. Type 2 diabetes: Continue oral medications with ADA diet.  5. Hyperlipidemia: Continue statin  6. Essential hypertension: Continue diltiazem and metoprolol   DISCHARGE CONDITIONS AND DIET:   Stable for discharge on diabetic heart healthy diet  CONSULTS OBTAINED:  Treatment Team:   Alwyn Pea, MD  DRUG ALLERGIES:  No Known Allergies  DISCHARGE MEDICATIONS:   Current Discharge Medication List    START taking these medications   Details  !! furosemide (LASIX) 20 MG tablet Take 1 tablet (20 mg total) by mouth daily. Qty: 30 tablet, Refills: 0    potassium chloride (K-DUR) 10 MEQ tablet Take 1 tablet (10 mEq total) by mouth daily. Qty: 30 tablet, Refills: 0     !! - Potential duplicate medications found. Please discuss with provider.    CONTINUE these medications which have CHANGED   Details  diltiazem (CARDIZEM CD) 180 MG 24 hr capsule Take 1 capsule (180 mg total) by mouth daily. Qty: 30 capsule, Refills: 0    glipiZIDE (GLUCOTROL) 10 MG tablet Take 1 tablet (10 mg total) by mouth 2 (two) times daily before a meal.   Associated Diagnoses: Uncontrolled type 2 diabetes mellitus with peripheral neuropathy (HCC)      CONTINUE these medications which have NOT CHANGED   Details  amiodarone (PACERONE) 400 MG tablet Take 1 tablet (400 mg total) by mouth daily. Qty: 30 tablet, Refills: 6    !! furosemide (LASIX) 40 MG tablet Take 1 tablet (40 mg total) by mouth daily. Qty: 2 tablet, Refills: 0    metoprolol tartrate (LOPRESSOR) 25 MG tablet Take 25 mg by mouth 2 (two) times daily.    nitroGLYCERIN (NITROSTAT) 0.4 MG SL tablet Place 0.4 mg under the tongue every 5 (five) minutes as needed for chest pain.     rosuvastatin (CRESTOR) 20 MG tablet Take 20 mg by mouth daily.  warfarin (COUMADIN) 5 MG tablet Take 2.5-5 mg by mouth daily. 5 mg daily on Monday-Friday; 2.5 mg daily on Saturday and Sunday    hydrALAZINE (APRESOLINE) 25 MG tablet TAKE 1 TABLET BY MOUTH THREE TIMES DAILY Qty: 270 tablet, Refills: 0   Associated Diagnoses: Essential hypertension     !! - Potential duplicate medications found. Please discuss with provider.        Today   CHIEF COMPLAINT:  Patient doing much better this morning denies shortness of breath. At times he  does have palpitations.   VITAL SIGNS:  Blood pressure 128/74, pulse 80, temperature 97.9 F (36.6 C), temperature source Oral, resp. rate 18, height 5\' 11"  (1.803 m), weight 88.2 kg (194 lb 8 oz), SpO2 97 %.   REVIEW OF SYSTEMS:  Review of Systems  Constitutional: Negative.  Negative for chills, fever and malaise/fatigue.  HENT: Negative.  Negative for ear discharge, ear pain, hearing loss, nosebleeds and sore throat.   Eyes: Negative.  Negative for blurred vision and pain.  Respiratory: Negative.  Negative for cough, hemoptysis, shortness of breath and wheezing.   Cardiovascular: Negative.  Negative for chest pain, palpitations and leg swelling.  Gastrointestinal: Negative.  Negative for abdominal pain, blood in stool, diarrhea, nausea and vomiting.  Genitourinary: Negative.  Negative for dysuria.  Musculoskeletal: Negative.  Negative for back pain.  Skin: Negative.   Neurological: Negative for dizziness, tremors, speech change, focal weakness, seizures and headaches.  Endo/Heme/Allergies: Negative.  Does not bruise/bleed easily.  Psychiatric/Behavioral: Negative.  Negative for depression, hallucinations and suicidal ideas.     PHYSICAL EXAMINATION:   GENERAL:  71 y.o.-year-old patient lying in the bed with no acute distress.  NECK:  Supple, no jugular venous distention. No thyroid enlargement, no tenderness.  LUNGS: Normal breath sounds bilaterally, no wheezing, rales,rhonchi  No use of accessory muscles of respiration.  CARDIOVASCULAR: Irregular, irregular tachycardia. No murmurs, rubs, or gallops.  ABDOMEN: Soft, non-tender, non-distended. Bowel sounds present. No organomegaly or mass.  EXTREMITIES: No pedal edema, cyanosis, or clubbing.  PSYCHIATRIC: The patient is alert and oriented x 3.  SKIN: No obvious rash, lesion, or ulcer.   DATA REVIEW:   CBC  Recent Labs Lab 02/24/17 0931  WBC 6.9  HGB 13.4  HCT 42.1  PLT 259    Chemistries   Recent Labs Lab  02/26/17 0449  NA 139  K 3.3*  CL 103  CO2 26  GLUCOSE 171*  BUN 23*  CREATININE 1.56*  CALCIUM 8.7*    Cardiac Enzymes  Recent Labs Lab 02/24/17 1313 02/24/17 1828 02/25/17 0005  TROPONINI 0.30* 0.26* 0.27*    Microbiology Results  @MICRORSLT48 @  RADIOLOGY:  Dg Chest 1 View  Result Date: 02/26/2017 CLINICAL DATA:  Congestive heart 02/24/2017 EXAM: CHEST 1 VIEW COMPARISON:  None. FINDINGS: The lungs are clear. Resolved interstitial fluid. Unchanged mild cardiomegaly. Pulmonary vasculature is normal. No pleural effusions. IMPRESSION: Resolved interstitial and vascular prominence. Resolved small pleural effusions. Mild cardiomegaly. Electronically Signed   By: Ellery Plunk M.D.   On: 02/26/2017 06:09   Dg Chest 2 View  Result Date: 02/24/2017 CLINICAL DATA:  Shortness of Breath EXAM: CHEST  2 VIEW COMPARISON:  12/07/2016 FINDINGS: Mild cardiomegaly. Peribronchial thickening and interstitial prominence could reflect bronchitis or mild interstitial edema. No effusions or acute bony abnormality. Findings are similar prior study. IMPRESSION: Cardiomegaly with stable peribronchial thickening and interstitial prominence which may reflect bronchitis or interstitial edema. Electronically Signed   By: Charlett Nose M.D.  On: 02/24/2017 09:52      Current Discharge Medication List    START taking these medications   Details  !! furosemide (LASIX) 20 MG tablet Take 1 tablet (20 mg total) by mouth daily. Qty: 30 tablet, Refills: 0    potassium chloride (K-DUR) 10 MEQ tablet Take 1 tablet (10 mEq total) by mouth daily. Qty: 30 tablet, Refills: 0     !! - Potential duplicate medications found. Please discuss with provider.    CONTINUE these medications which have CHANGED   Details  diltiazem (CARDIZEM CD) 180 MG 24 hr capsule Take 1 capsule (180 mg total) by mouth daily. Qty: 30 capsule, Refills: 0    glipiZIDE (GLUCOTROL) 10 MG tablet Take 1 tablet (10 mg total) by mouth 2  (two) times daily before a meal.   Associated Diagnoses: Uncontrolled type 2 diabetes mellitus with peripheral neuropathy (HCC)      CONTINUE these medications which have NOT CHANGED   Details  amiodarone (PACERONE) 400 MG tablet Take 1 tablet (400 mg total) by mouth daily. Qty: 30 tablet, Refills: 6    !! furosemide (LASIX) 40 MG tablet Take 1 tablet (40 mg total) by mouth daily. Qty: 2 tablet, Refills: 0    metoprolol tartrate (LOPRESSOR) 25 MG tablet Take 25 mg by mouth 2 (two) times daily.    nitroGLYCERIN (NITROSTAT) 0.4 MG SL tablet Place 0.4 mg under the tongue every 5 (five) minutes as needed for chest pain.     rosuvastatin (CRESTOR) 20 MG tablet Take 20 mg by mouth daily.    warfarin (COUMADIN) 5 MG tablet Take 2.5-5 mg by mouth daily. 5 mg daily on Monday-Friday; 2.5 mg daily on Saturday and Sunday    hydrALAZINE (APRESOLINE) 25 MG tablet TAKE 1 TABLET BY MOUTH THREE TIMES DAILY Qty: 270 tablet, Refills: 0   Associated Diagnoses: Essential hypertension     !! - Potential duplicate medications found. Please discuss with provider.         Management plans discussed with the patient and he is in agreement. Stable for discharge   Patient should follow up with cardiology  CODE STATUS:     Code Status Orders        Start     Ordered   02/24/17 1159  Full code  Continuous     08 /30/18 1158    Code Status History    Date Active Date Inactive Code Status Order ID Comments User Context   09/26/2016  5:55 PM 09/28/2016  3:53 PM Full Code [id number]  Altamese DillingVachhani, Vaibhavkumar, MD Inpatient   07/16/2015 12:58 PM 07/21/2015  3:07 PM Full Code [id number]  Alford HighlandWieting, Richard, MD ED   04/16/2015  8:16 PM 04/17/2015  5:07 PM Full Code [id number]  Ramonita LabGouru, Aruna, MD Inpatient   05/20/2014  6:07 PM 05/27/2014  2:23 PM Full Code [id number]  Tonny Bollmanooper, Michael, MD Inpatient   05/20/2014  6:07 PM 05/20/2014  6:07 PM Full Code [id number]  Tonny Bollmanooper, Michael, MD Inpatient      TOTAL TIME TAKING  CARE OF THIS PATIENT: 38 minutes.    Note: This dictation was prepared with Dragon dictation along with smaller phrase technology. Any transcriptional errors that result from this process are unintentional.  Wakisha Alberts M.D on 02/26/2017 at 8:24 AM  Between 7am to 6pm - Pager - [phone] After 6pm go to [email or web address] - Social research officer, governmentpassword [email or web address] [email or web address]  [facility]  Office  [phone]  CC: Primary care physician; Ellyn HackShah, Syed Asad A, MD

## 2017-02-26 NOTE — Progress Notes (Signed)
Pt is to be discharged today. Iv and tele removed. disch instructions and prescrips given to pt to his understanding. disch via w.c. Accompanied by wife.

## 2017-03-02 ENCOUNTER — Encounter: Payer: Self-pay | Admitting: Family Medicine

## 2017-03-02 ENCOUNTER — Ambulatory Visit (INDEPENDENT_AMBULATORY_CARE_PROVIDER_SITE_OTHER): Payer: Medicare HMO | Admitting: Family Medicine

## 2017-03-02 VITALS — BP 146/81 | HR 71 | Temp 97.9°F | Resp 16 | Ht 71.0 in | Wt 202.1 lb

## 2017-03-02 DIAGNOSIS — E785 Hyperlipidemia, unspecified: Secondary | ICD-10-CM | POA: Diagnosis not present

## 2017-03-02 DIAGNOSIS — I5023 Acute on chronic systolic (congestive) heart failure: Secondary | ICD-10-CM | POA: Diagnosis not present

## 2017-03-02 DIAGNOSIS — I10 Essential (primary) hypertension: Secondary | ICD-10-CM | POA: Diagnosis not present

## 2017-03-02 DIAGNOSIS — E1142 Type 2 diabetes mellitus with diabetic polyneuropathy: Secondary | ICD-10-CM

## 2017-03-02 DIAGNOSIS — E1165 Type 2 diabetes mellitus with hyperglycemia: Secondary | ICD-10-CM

## 2017-03-02 DIAGNOSIS — IMO0002 Reserved for concepts with insufficient information to code with codable children: Secondary | ICD-10-CM

## 2017-03-02 LAB — POCT GLYCOSYLATED HEMOGLOBIN (HGB A1C): Hemoglobin A1C: 8.7

## 2017-03-02 IMAGING — CR DG KNEE 1-2V*L*
1 series · 2 of 2 positions shown · non-contrast
Comparison: None.

CLINICAL DATA: Left anterior knee pain 4 days. Tender to touch. No
injury.

EXAM:
LEFT KNEE - 1-2 VIEW

[Series 1: dg knee 1-2 views left · 0.14mm/px · 2 of 2 slices shown]
[im 1/2]
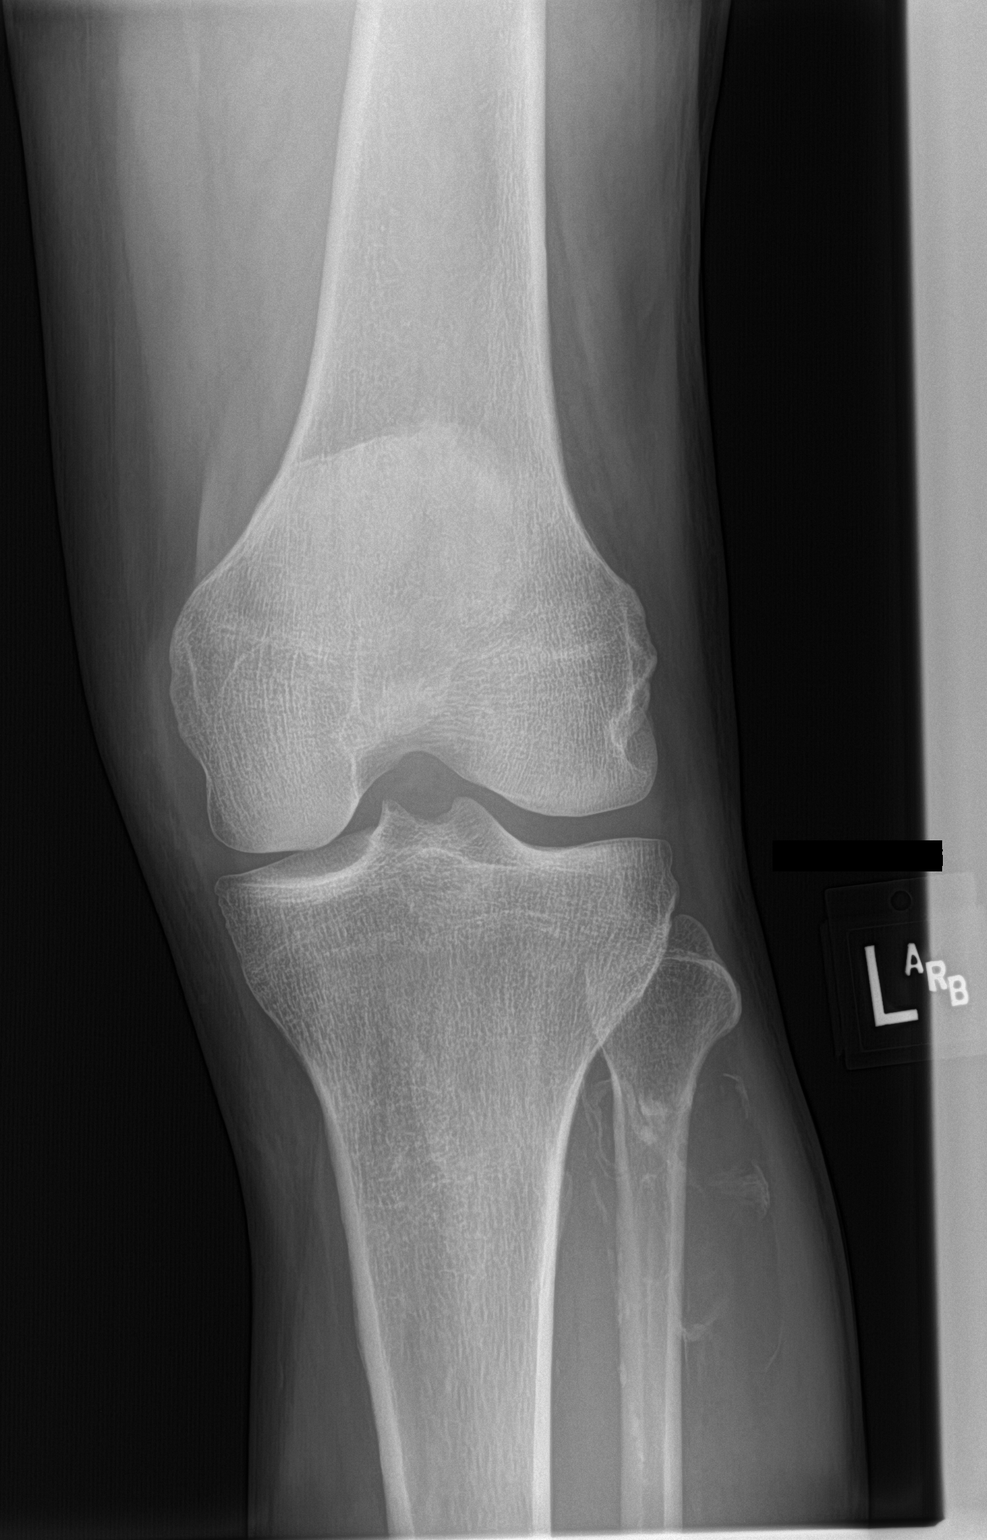
[im 2/2]
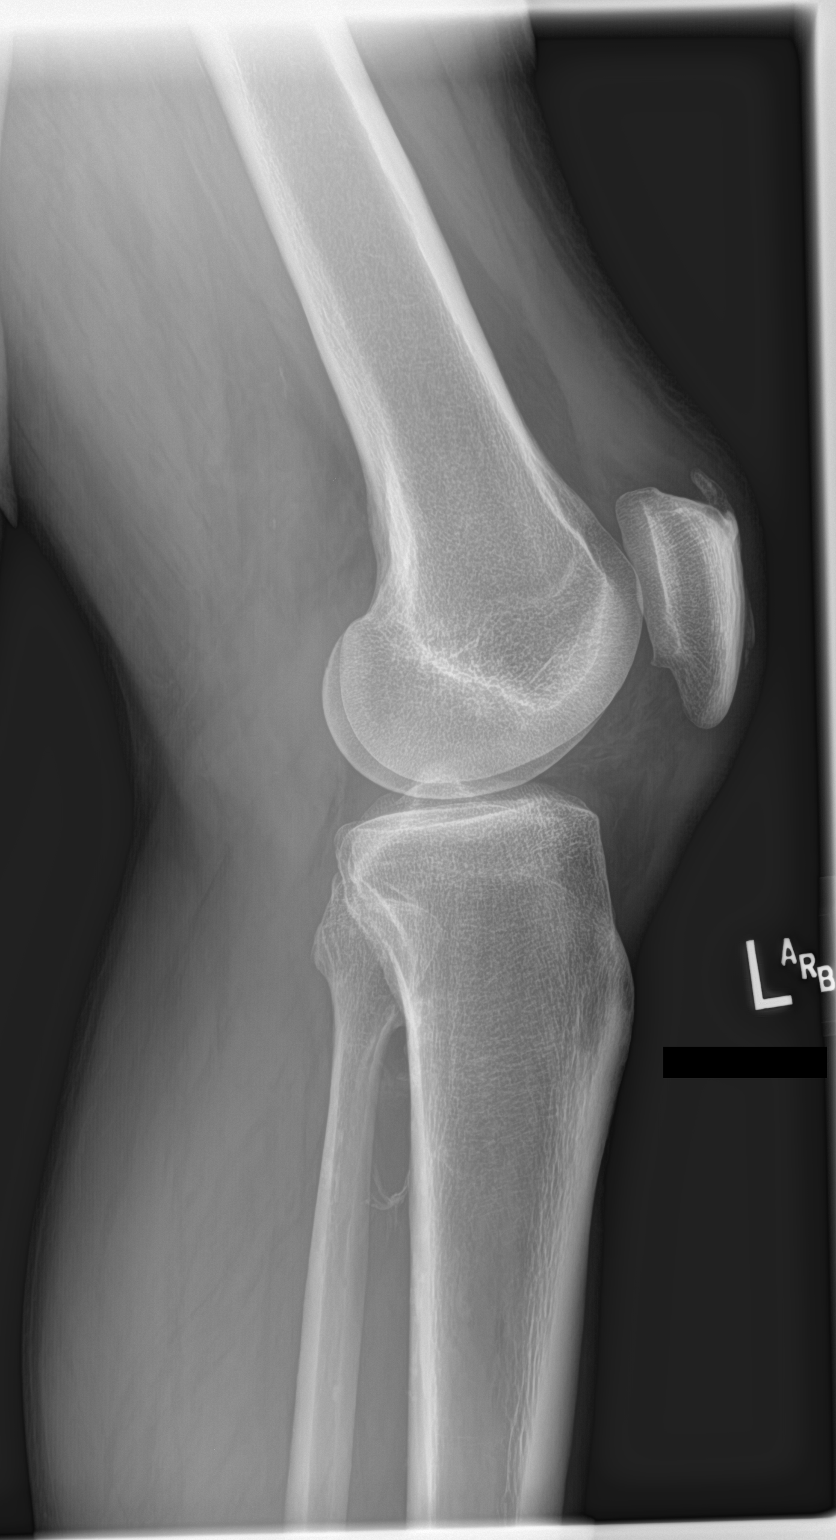

[2 of 2 positions shown; findings below may reference images not displayed]

FINDINGS: There is no evidence of fracture, dislocation, or joint effusion.
There is no evidence of arthropathy or other focal bone abnormality.
Soft tissues are unremarkable.
IMPRESSION: Negative.

## 2017-03-02 MED ORDER — GLIPIZIDE 10 MG PO TABS: 10.0000 mg | ORAL_TABLET | Freq: Every day | ORAL | 0 refills | Status: AC

## 2017-03-02 MED ORDER — LINAGLIPTIN 5 MG PO TABS: 5.0000 mg | ORAL_TABLET | Freq: Every day | ORAL | 0 refills | Status: AC

## 2017-03-04 ENCOUNTER — Telehealth: Payer: Self-pay | Admitting: Family

## 2017-03-04 ENCOUNTER — Ambulatory Visit: Payer: Medicare HMO | Admitting: Family

## 2017-03-04 NOTE — Telephone Encounter (Signed)
Patient did not show for his Heart Failure Clinic appointment on 03/04/17. Will attempt to reschedule.

## 2017-03-05 IMAGING — CR DG CHEST 1V PORT
1 series · 1 of 1 positions shown · non-contrast
Comparison: July 13, 2015.

CLINICAL DATA: Tachycardia.

EXAM:
PORTABLE CHEST 1 VIEW

[ap]
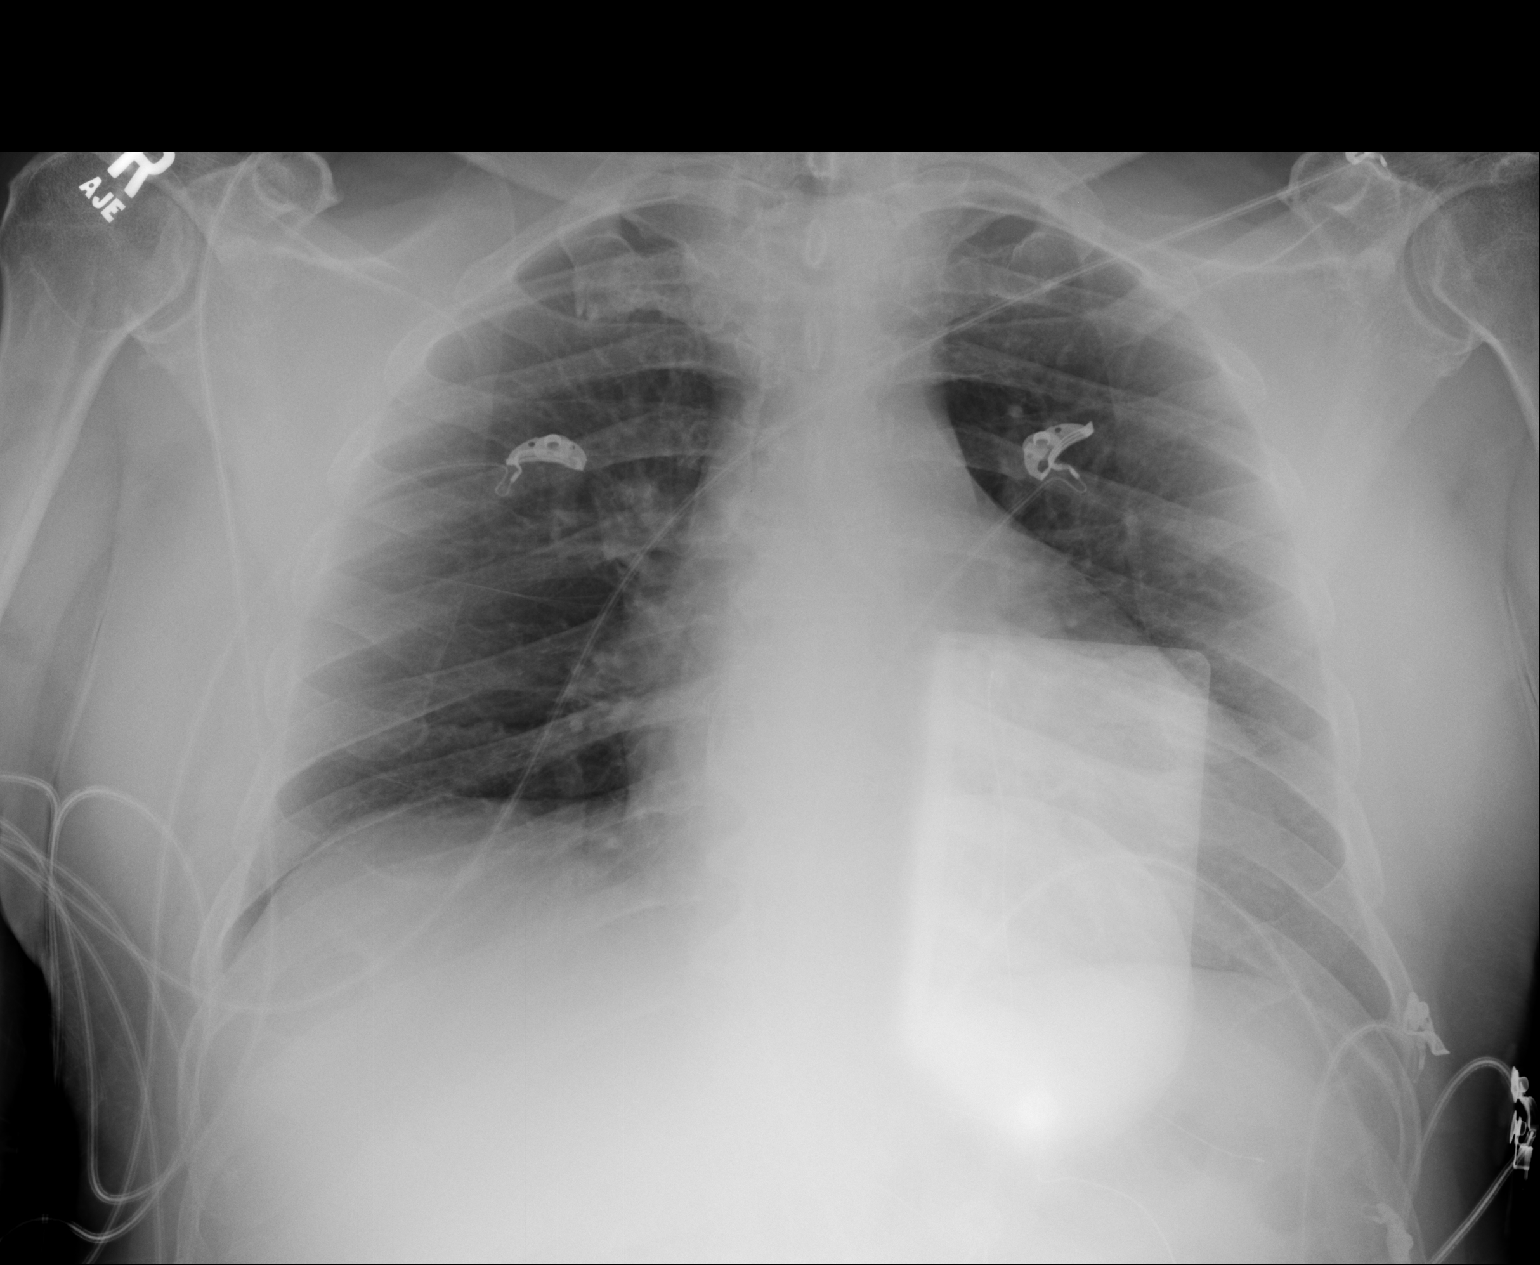

[1 of 1 positions shown; findings below may reference images not displayed]

FINDINGS: The heart size and mediastinal contours are within normal limits.
Both lungs are clear. No pneumothorax or pleural effusion is noted.
The visualized skeletal structures are unremarkable.
IMPRESSION: No acute cardiopulmonary abnormality seen.

## 2017-03-28 NOTE — Progress Notes (Signed)
Name: [name]   MRN: [id number]    DOB: 06/14/1946   Date:03/23/2017       Progress Note  Subjective  Chief Complaint  Chief Complaint  Patient presents with  . Follow-up    3 mo  . Medication Refill  . Diabetes  . Hypertension    Diabetes  He presents for his follow-up diabetic visit. He has type 2 diabetes mellitus. His disease course has been worsening. Hypoglycemia symptoms include sweats. Pertinent negatives for hypoglycemia include no dizziness or headaches. (At times, he has shakes and he drinks something sweet and it goes away) Associated symptoms include foot paresthesias. Pertinent negatives for diabetes include no blurred vision, no chest pain, no fatigue, no polydipsia and no polyuria. Diabetic complications include heart disease. Pertinent negatives for diabetic complications include no CVA or peripheral neuropathy. Current diabetic treatment includes oral agent (monotherapy). His weight is stable. He is following a diabetic and generally healthy diet. He monitors blood glucose at home 1-2 x per day. His breakfast blood glucose range is generally 90-110 mg/dl. An ACE inhibitor/angiotensin II receptor blocker is not being taken.  Hypertension  This is a chronic problem. The problem is unchanged. The problem is controlled. Associated symptoms include sweats. Pertinent negatives include no blurred vision, chest pain, headaches or palpitations. Past treatments include direct vasodilators, calcium channel blockers and beta blockers. Hypertensive end-organ damage includes CAD/MI and heart failure. There is no history of CVA.     Past Medical History:  Diagnosis Date  . Atrial fibrillation and flutter (HCC)   . CHF (congestive heart failure) (HCC)   . Diabetes mellitus with complication (HCC)   . Essential hypertension   . Gastric ulcer   . GI bleeding   . History of hiatal hernia   . Myocardial infarction (HCC)   . Ventricular tachycardia (HCC) 05/20/2014    Past  Surgical History:  Procedure Laterality Date  . ELECTROPHYSIOLOGIC STUDY N/A 07/28/2015   Procedure: Cardioversion;  Surgeon: Lamar BlinksBruce J Kowalski, MD;  Location: [facility] ORS;  Service: Cardiovascular;  Laterality: N/A;  . HERNIA REPAIR    . LEFT HEART CATHETERIZATION WITH CORONARY ANGIOGRAM N/A 05/20/2014   Procedure: LEFT HEART CATHETERIZATION WITH CORONARY ANGIOGRAM;  Surgeon: Micheline ChapmanMichael D Cooper, MD;  [facility];  Service: Cardiovascular;  Laterality: N/A;    Family History  Problem Relation Age of Onset  . Congestive Heart Failure Mother   . Breast cancer Mother     Social History   Social History  . Marital status: Married    Spouse name: N/A  . Number of children: N/A  . Years of education: N/A   Occupational History  . Not on file.   Social History Main Topics  . Smoking status: Former Smoker    Quit date: 04/28/1964  . Smokeless tobacco: Never Used  . Alcohol use No  . Drug use: No  . Sexual activity: Yes    Birth control/ protection: Coitus interruptus   Other Topics Concern  . Not on file   Social History Narrative   Married, lives in [place]. Retired from [employer] work. Nonsmoker, no Etoh.     Current Outpatient Prescriptions:  .  amiodarone (PACERONE) 400 MG tablet, Take 1 tablet (400 mg total) by mouth daily., Disp: 30 tablet, Rfl: 6 .  diltiazem (CARDIZEM CD) 180 MG 24 hr capsule, Take 1 capsule (180 mg total) by mouth daily., Disp: 30 capsule, Rfl: 0 .  furosemide (LASIX) 20 MG tablet, Take 1 tablet (20  mg total) by mouth daily., Disp: 30 tablet, Rfl: 0 .  furosemide (LASIX) 40 MG tablet, Take 1 tablet (40 mg total) by mouth daily., Disp: 2 tablet, Rfl: 0 .  glipiZIDE (GLUCOTROL) 10 MG tablet, Take 1 tablet (10 mg total) by mouth 2 (two) times daily before a meal., Disp: , Rfl:  .  hydrALAZINE (APRESOLINE) 25 MG tablet, TAKE 1 TABLET BY MOUTH THREE TIMES DAILY, Disp: 270 tablet, Rfl: 0 .  metoprolol tartrate (LOPRESSOR) 25 MG tablet, Take 25 mg by mouth  2 (two) times daily., Disp: , Rfl:  .  nitroGLYCERIN (NITROSTAT) 0.4 MG SL tablet, Place 0.4 mg under the tongue every 5 (five) minutes as needed for chest pain. , Disp: , Rfl:  .  potassium chloride (K-DUR) 10 MEQ tablet, Take 1 tablet (10 mEq total) by mouth daily., Disp: 30 tablet, Rfl: 0 .  rosuvastatin (CRESTOR) 20 MG tablet, Take 20 mg by mouth daily., Disp: , Rfl:  .  warfarin (COUMADIN) 5 MG tablet, Take 2.5-5 mg by mouth daily. 5 mg daily on Monday-Friday; 2.5 mg daily on Saturday and Sunday, Disp: , Rfl:   No Known Allergies   Review of Systems  Constitutional: Negative for fatigue.  Eyes: Negative for blurred vision.  Cardiovascular: Negative for chest pain and palpitations.  Neurological: Negative for dizziness and headaches.  Endo/Heme/Allergies: Negative for polydipsia.    Objective  Vitals:   2017/03/13 0933  BP: (!) 146/81  Pulse: 71  Resp: 16  Temp: 97.9 F (36.6 C)  TempSrc: Oral  SpO2: 98%  Weight: 202 lb 1.6 oz (91.7 kg)  Height: 5\' 11"  (1.803 m)    Physical Exam  Constitutional: He is oriented to person, place, and time and well-developed, well-nourished, and in no distress.  HENT:  Head: Normocephalic and atraumatic.  Cardiovascular: Regular rhythm and normal heart sounds.  Tachycardia present.   No murmur heard. Pulmonary/Chest: Effort normal and breath sounds normal. He has no wheezes.  Abdominal: Soft. Bowel sounds are normal.  Musculoskeletal: He exhibits no edema.  Neurological: He is alert and oriented to person, place, and time.  Psychiatric: Mood, memory, affect and judgment normal.  Nursing note and vitals reviewed.     Assessment & Plan  1. Uncontrolled type 2 diabetes mellitus with peripheral neuropathy (HCC) Point-of-care A1c 8.7%, decreased glipizide to 1 tablet daily, add Tradjenta, recheck in 3 months - POCT HgB A1C - glipiZIDE (GLUCOTROL) 10 MG tablet; Take 1 tablet (10 mg total) by mouth daily before breakfast.  Dispense: 90  tablet; Refill: 0 - linagliptin (TRADJENTA) 5 MG TABS tablet; Take 1 tablet (5 mg total) by mouth daily.  Dispense: 90 tablet; Refill: 0  2. CHF (congestive heart failure), NYHA class III, acute on chronic, systolic [facility]) Reviewed hospital discharge summary, on diuretic, being managed by cardiology for anticoagulation  3. Essential hypertension  4. Dyslipidemia  - Lipid panel   Nakeysha Pasqual Asad A. Faylene Kurtz [facility] March 13, 2017 9:50 AM

## 2017-03-28 DEATH — deceased

## 2017-05-23 ENCOUNTER — Ambulatory Visit: Payer: Self-pay

## 2017-06-01 ENCOUNTER — Ambulatory Visit: Payer: Medicare HMO | Admitting: Family Medicine

## 2018-05-13 IMAGING — CR DG CHEST 2V
2 series · 2 of 2 positions shown · non-contrast
Comparison: Chest radiograph performed 08/08/2015

CLINICAL DATA: Acute onset of sore throat and nonproductive cough.
Sinus congestion and fever. Initial encounter.

EXAM:
CHEST  2 VIEW

[chest pa]
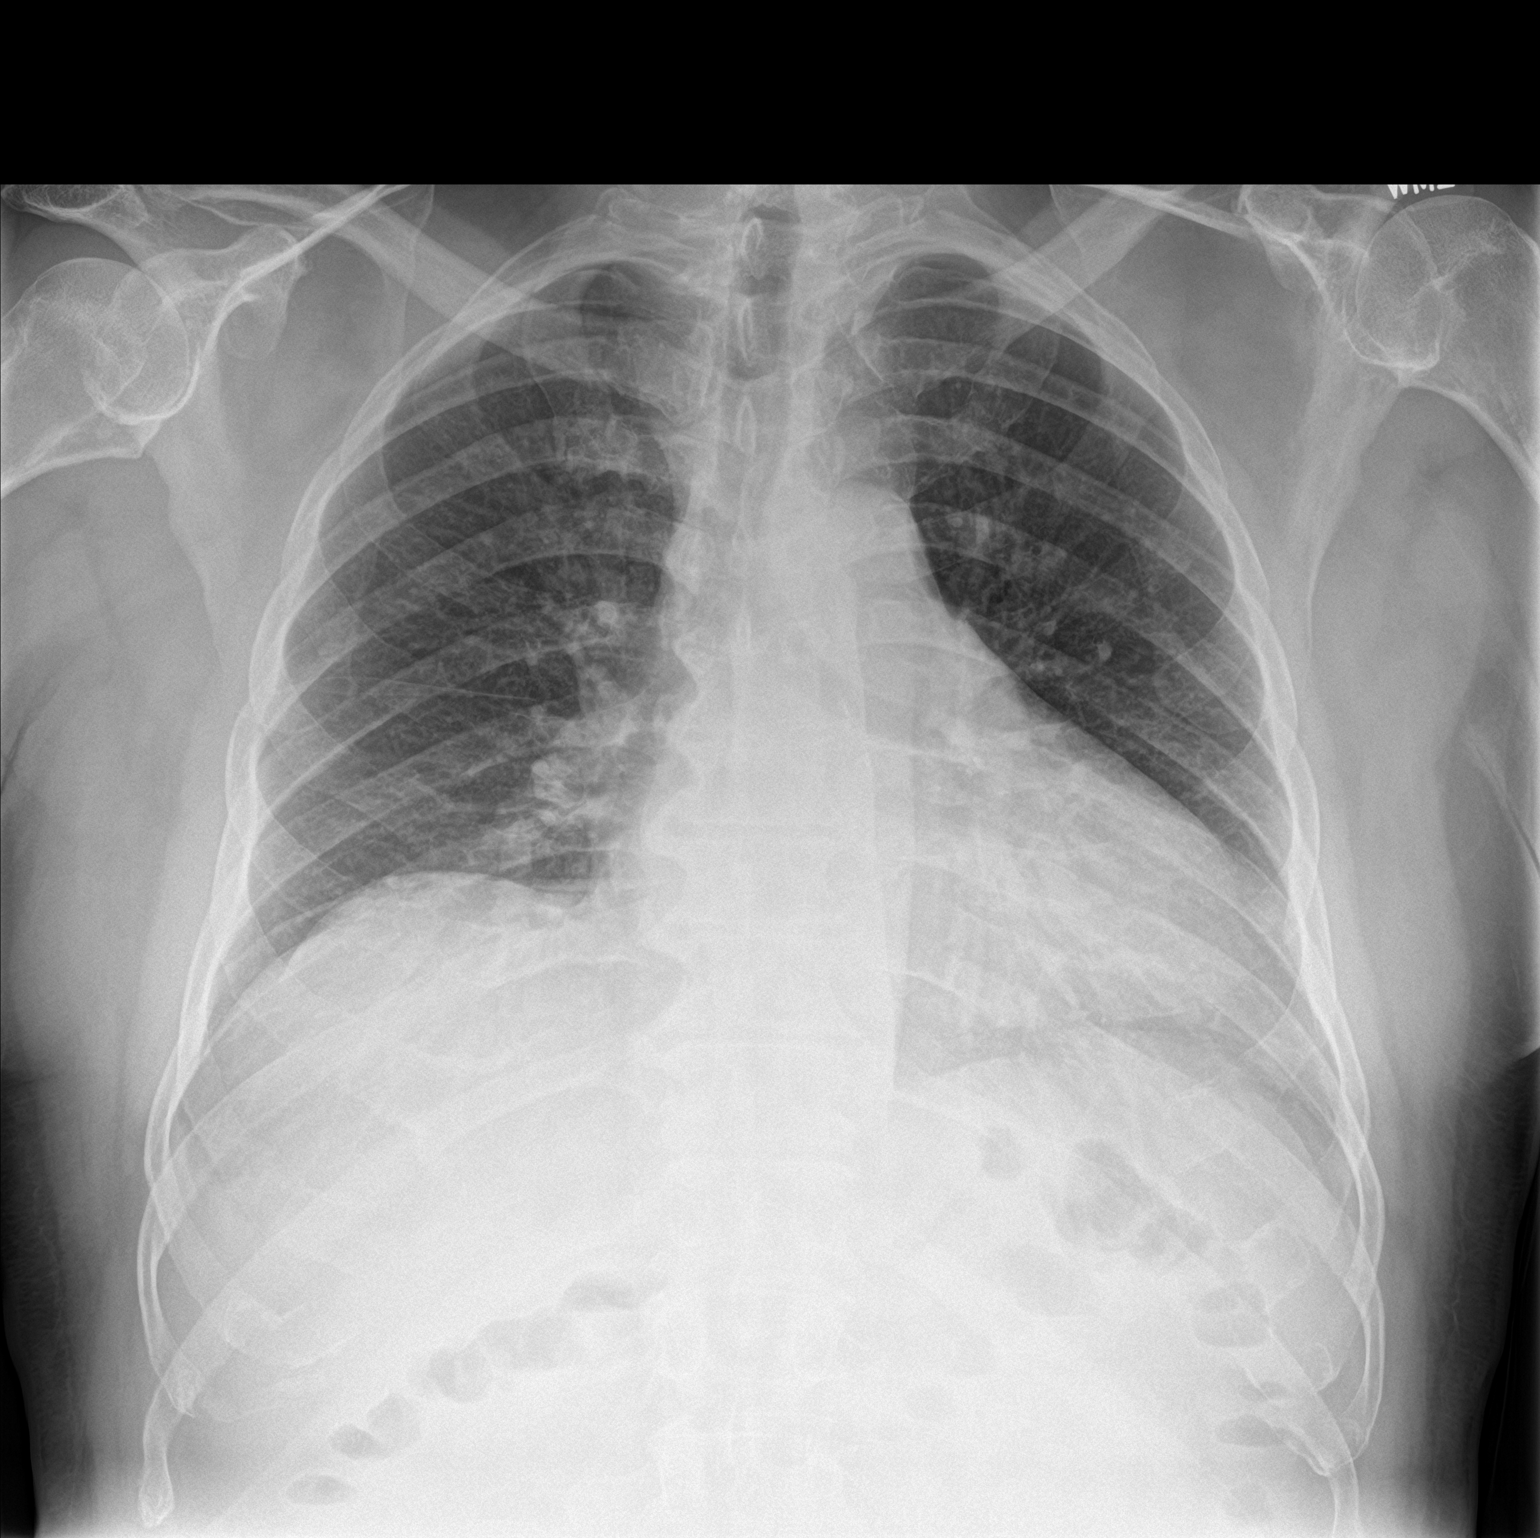

[chest lat]
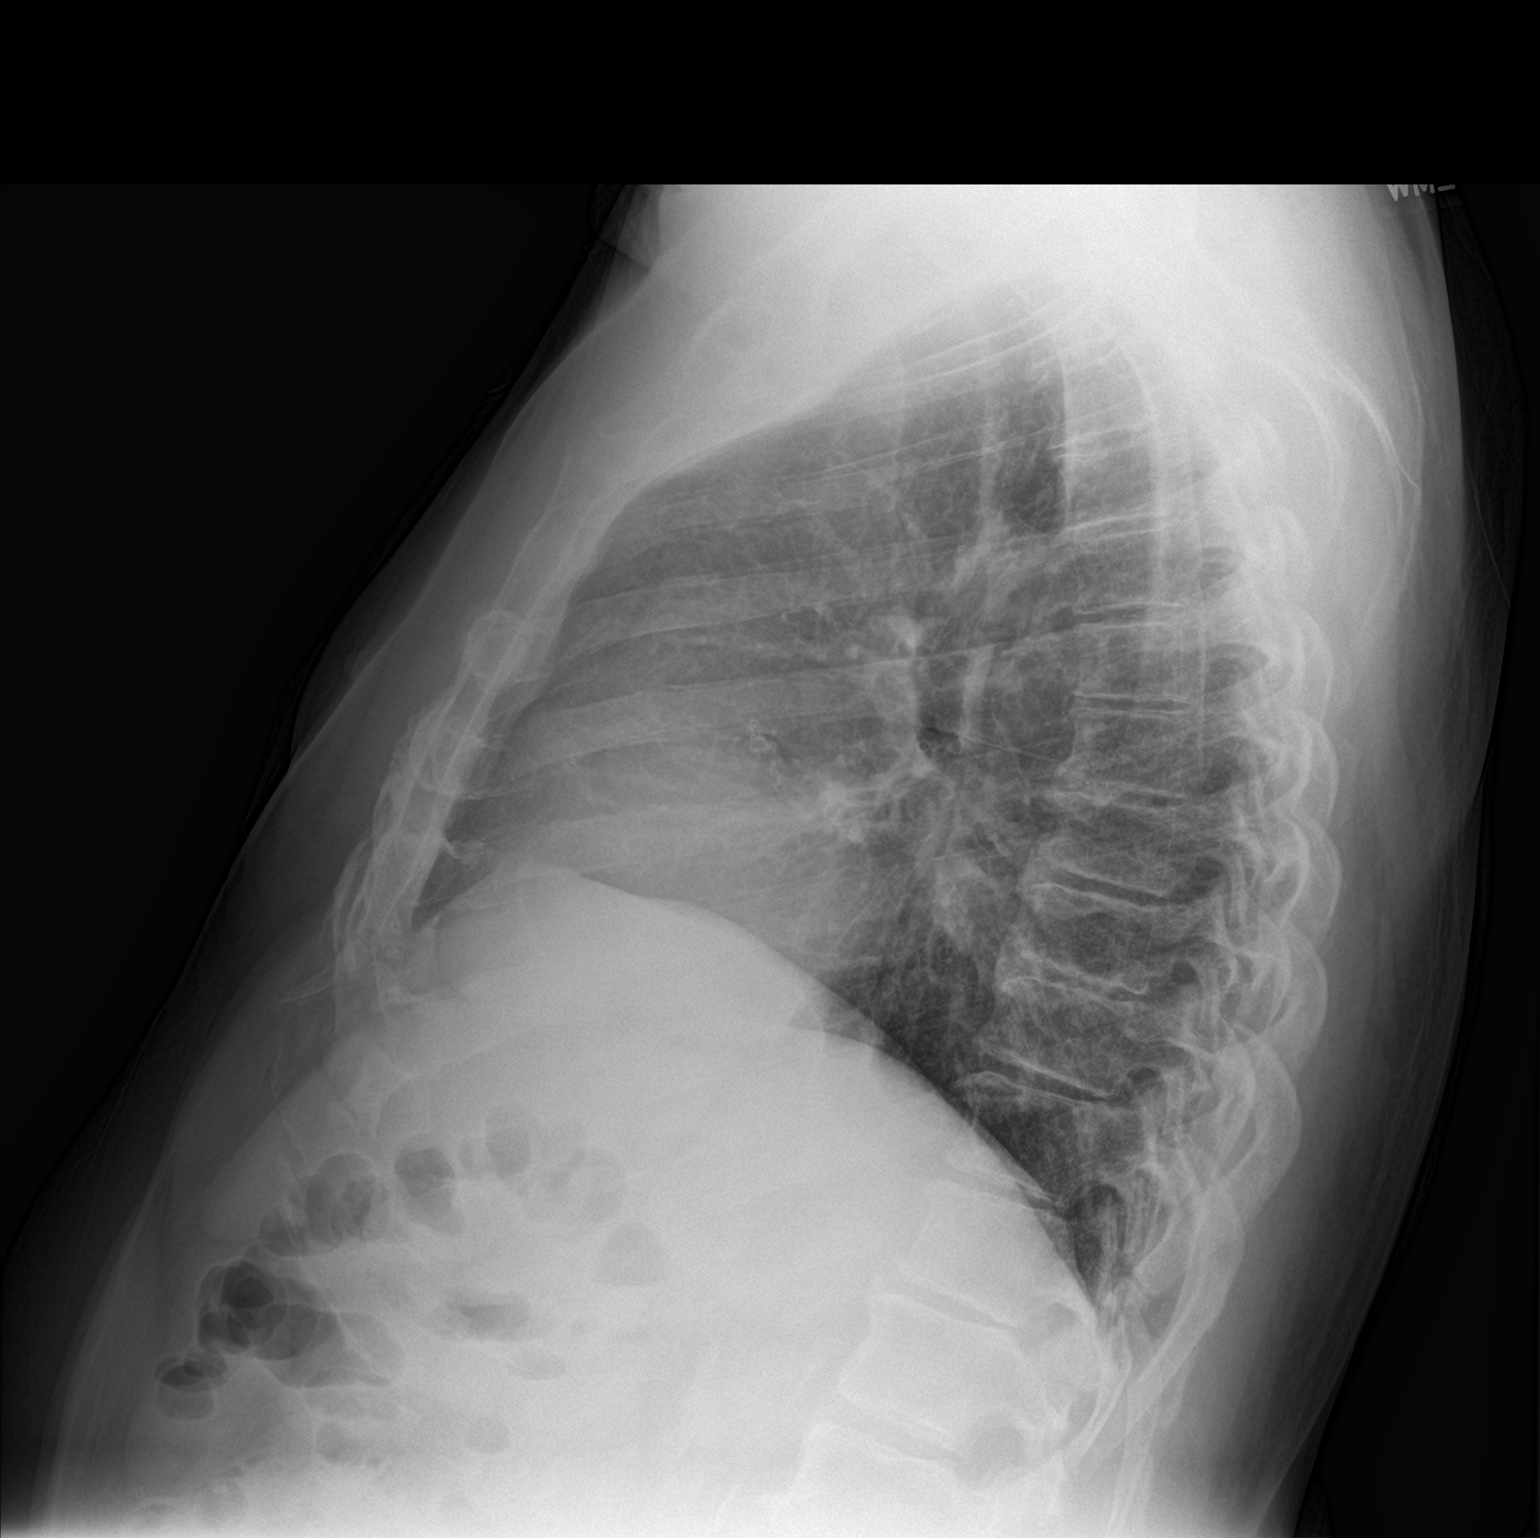

[2 of 2 positions shown; findings below may reference images not displayed]

FINDINGS: The lungs are well-aerated. Mild vascular congestion is noted. There
is no evidence of focal opacification, pleural effusion or
pneumothorax.

The heart is mildly enlarged. No acute osseous abnormalities are
seen.
IMPRESSION: Mild vascular congestion and mild cardiomegaly. Lungs remain grossly
clear.

## 2018-05-17 IMAGING — CR DG CHEST 2V
1 series · 2 of 2 positions shown · non-contrast
Comparison: 09/22/2016

CLINICAL DATA: Pt reports was seen last week for URI, symptoms have
not improved. Family reports pt has been having epitaxis, hemoptysis
and hematuria. Pt complains of sore throat. Hx/o a-fib, CHF,
diabetes, HTN, and MI; former smoker.

EXAM:
CHEST  2 VIEW

[Series 1: dg chest 2 view · 0.14mm/px · 2 of 2 slices shown]
[im 1/2]
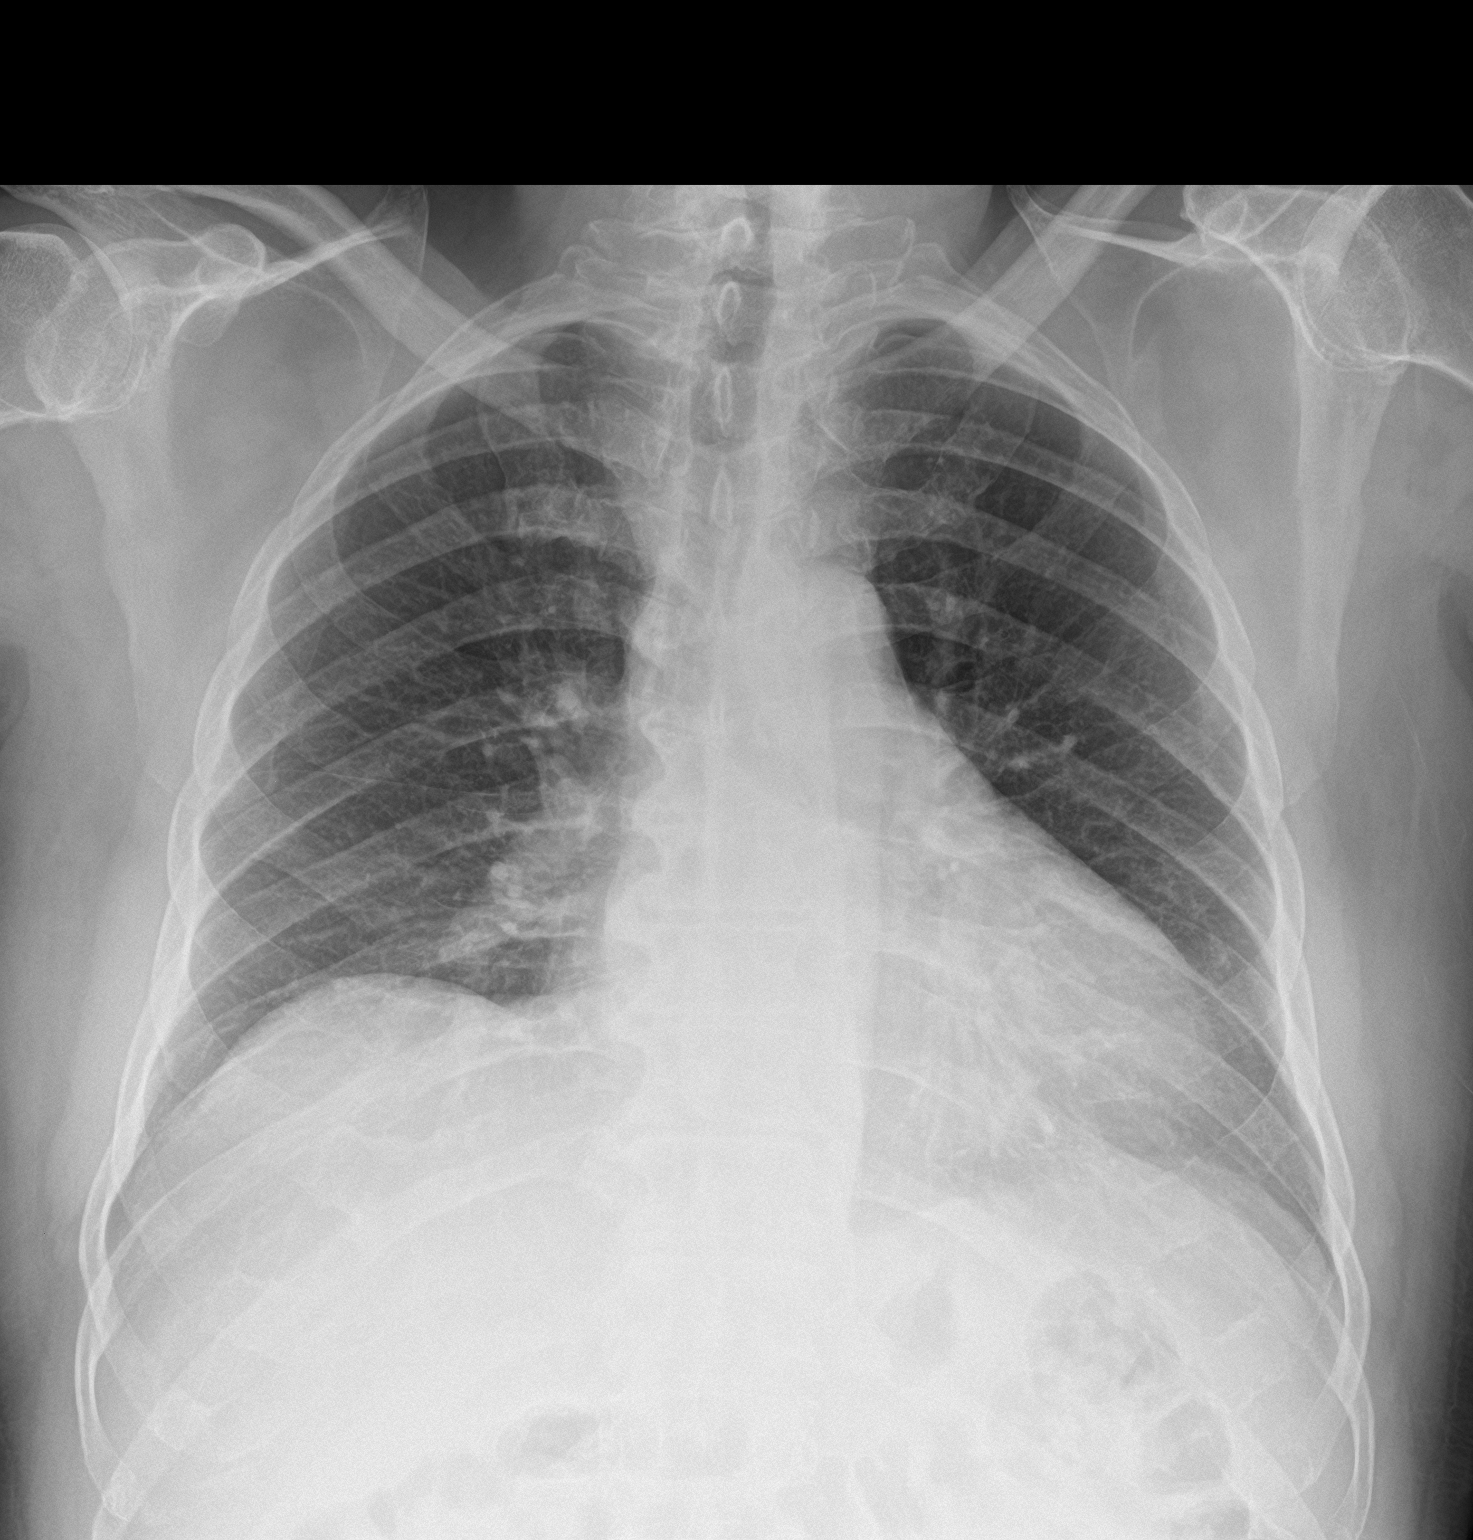
[im 2/2]
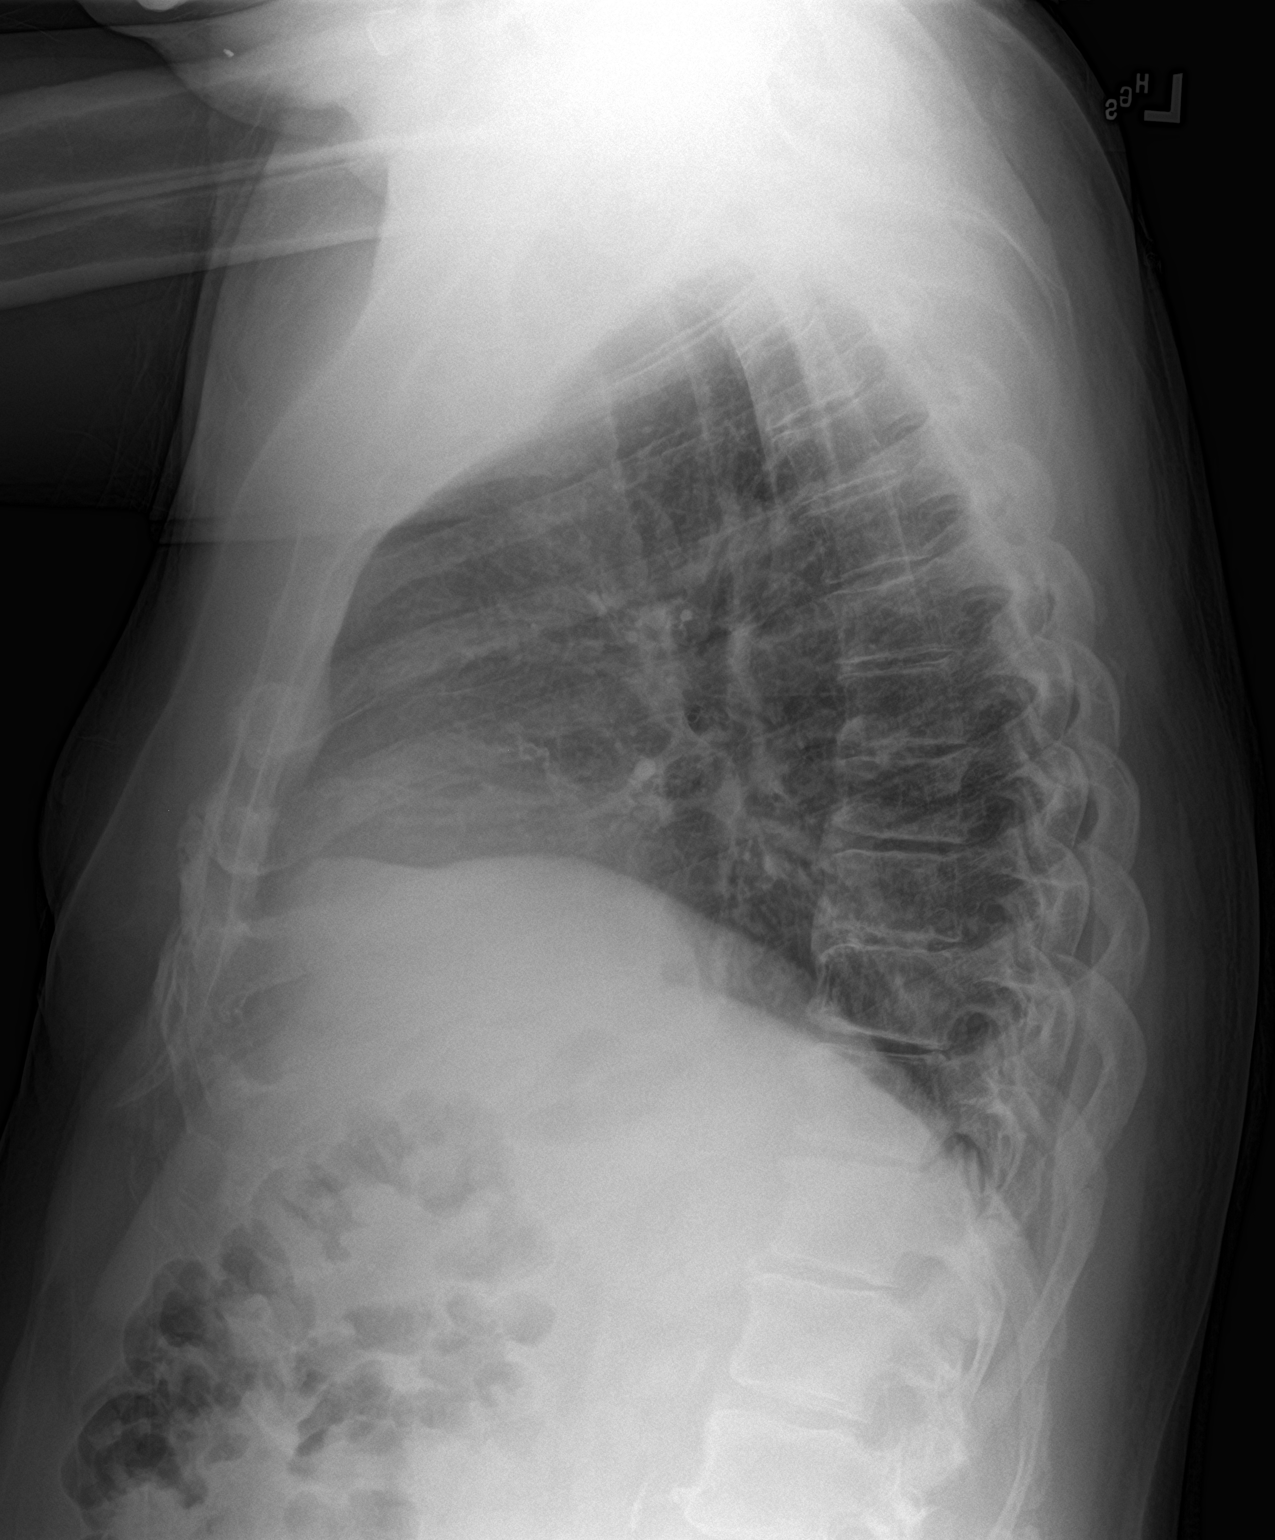

[2 of 2 positions shown; findings below may reference images not displayed]

FINDINGS: The heart size and mediastinal contours are within normal limits.
Both lungs are clear. No pleural effusion or pneumothorax. The
visualized skeletal structures are unremarkable.
IMPRESSION: No active cardiopulmonary disease.
